# Patient Record
Sex: Female | Born: 1970 | Race: White | Hispanic: No | Marital: Married | Smoking: Never smoker
Health system: Southern US, Community
[De-identification: ages and names within clinical notes are randomized; demographics above are authoritative.]

## PROBLEM LIST (undated history)

## (undated) ENCOUNTER — Emergency Department (HOSPITAL_COMMUNITY)

## (undated) DIAGNOSIS — Z9889 Other specified postprocedural states: Secondary | ICD-10-CM

## (undated) DIAGNOSIS — K219 Gastro-esophageal reflux disease without esophagitis: Secondary | ICD-10-CM

## (undated) DIAGNOSIS — E079 Disorder of thyroid, unspecified: Secondary | ICD-10-CM

## (undated) DIAGNOSIS — T7840XA Allergy, unspecified, initial encounter: Secondary | ICD-10-CM

## (undated) DIAGNOSIS — R112 Nausea with vomiting, unspecified: Secondary | ICD-10-CM

## (undated) DIAGNOSIS — E039 Hypothyroidism, unspecified: Secondary | ICD-10-CM

## (undated) HISTORY — DX: Disorder of thyroid, unspecified: E07.9

## (undated) HISTORY — DX: Allergy, unspecified, initial encounter: T78.40XA

---

## 1999-08-27 ENCOUNTER — Other Ambulatory Visit: Admission: RE | Admit: 1999-08-27 | Discharge: 1999-08-27 | Payer: Self-pay | Admitting: Family Medicine

## 2000-03-26 ENCOUNTER — Encounter: Payer: Self-pay | Admitting: Family Medicine

## 2000-03-26 ENCOUNTER — Ambulatory Visit (HOSPITAL_COMMUNITY): Admission: RE | Admit: 2000-03-26 | Discharge: 2000-03-26 | Payer: Self-pay | Admitting: *Deleted

## 2000-06-20 ENCOUNTER — Ambulatory Visit (HOSPITAL_COMMUNITY): Admission: RE | Admit: 2000-06-20 | Discharge: 2000-06-20 | Payer: Self-pay | Admitting: Family Medicine

## 2000-06-20 ENCOUNTER — Encounter: Payer: Self-pay | Admitting: Family Medicine

## 2000-08-15 ENCOUNTER — Ambulatory Visit (HOSPITAL_COMMUNITY): Admission: RE | Admit: 2000-08-15 | Discharge: 2000-08-15 | Payer: Self-pay | Admitting: Unknown Physician Specialty

## 2000-08-15 ENCOUNTER — Encounter: Payer: Self-pay | Admitting: Unknown Physician Specialty

## 2000-08-29 ENCOUNTER — Encounter (HOSPITAL_COMMUNITY): Admission: AD | Admit: 2000-08-29 | Discharge: 2000-09-05 | Payer: Self-pay | Admitting: Family Medicine

## 2000-09-04 ENCOUNTER — Inpatient Hospital Stay (HOSPITAL_COMMUNITY): Admission: AD | Admit: 2000-09-04 | Discharge: 2000-09-09 | Payer: Self-pay | Admitting: Family Medicine

## 2000-09-10 ENCOUNTER — Encounter: Admission: RE | Admit: 2000-09-10 | Discharge: 2000-10-10 | Payer: Self-pay | Admitting: Family Medicine

## 2000-10-30 ENCOUNTER — Other Ambulatory Visit: Admission: RE | Admit: 2000-10-30 | Discharge: 2000-10-30 | Payer: Self-pay | Admitting: Family Medicine

## 2001-12-11 ENCOUNTER — Other Ambulatory Visit: Admission: RE | Admit: 2001-12-11 | Discharge: 2001-12-11 | Payer: Self-pay | Admitting: Family Medicine

## 2002-12-23 ENCOUNTER — Other Ambulatory Visit: Admission: RE | Admit: 2002-12-23 | Discharge: 2002-12-23 | Payer: Self-pay | Admitting: *Deleted

## 2004-03-16 ENCOUNTER — Other Ambulatory Visit: Admission: RE | Admit: 2004-03-16 | Discharge: 2004-03-16 | Payer: Self-pay | Admitting: Family Medicine

## 2005-05-16 ENCOUNTER — Other Ambulatory Visit: Admission: RE | Admit: 2005-05-16 | Discharge: 2005-05-16 | Payer: Self-pay | Admitting: Family Medicine

## 2012-05-15 ENCOUNTER — Ambulatory Visit (INDEPENDENT_AMBULATORY_CARE_PROVIDER_SITE_OTHER): Payer: BC Managed Care – PPO | Admitting: Nurse Practitioner

## 2012-05-15 ENCOUNTER — Encounter: Payer: Self-pay | Admitting: Nurse Practitioner

## 2012-05-15 VITALS — BP 135/99 | HR 91 | Temp 98.2°F | Ht 65.0 in | Wt 357.0 lb

## 2012-05-15 DIAGNOSIS — R7309 Other abnormal glucose: Secondary | ICD-10-CM

## 2012-05-15 DIAGNOSIS — E039 Hypothyroidism, unspecified: Secondary | ICD-10-CM

## 2012-05-15 DIAGNOSIS — R739 Hyperglycemia, unspecified: Secondary | ICD-10-CM

## 2012-05-15 LAB — GLUCOSE, POCT (MANUAL RESULT ENTRY): POC Glucose: 90 mg/dl (ref 70–99)

## 2012-05-15 LAB — POCT GLYCOSYLATED HEMOGLOBIN (HGB A1C): Hemoglobin A1C: 5.7

## 2012-05-15 MED ORDER — LEVOTHYROXINE SODIUM 50 MCG PO TABS: 50.0000 ug | ORAL_TABLET | Freq: Every day | ORAL | Status: DC

## 2012-05-15 NOTE — Patient Instructions (Addendum)
Health Maintenance, Females A healthy lifestyle and preventative care can promote health and wellness.  Maintain regular health, dental, and eye exams.  Eat a healthy diet. Foods like vegetables, fruits, whole grains, low-fat dairy products, and lean protein foods contain the nutrients you need without too many calories. Decrease your intake of foods high in solid fats, added sugars, and salt. Get information about a proper diet from your caregiver, if necessary.  Regular physical exercise is one of the most important things you can do for your health. Most adults should get at least 150 minutes of moderate-intensity exercise (any activity that increases your heart rate and causes you to sweat) each week. In addition, most adults need muscle-strengthening exercises on 2 or more days a week.   Maintain a healthy weight. The body mass index (BMI) is a screening tool to identify possible weight problems. It provides an estimate of body fat based on height and weight. Your caregiver can help determine your BMI, and can help you achieve or maintain a healthy weight. For adults 20 years and older:  A BMI below 18.5 is considered underweight.  A BMI of 18.5 to 24.9 is normal.  A BMI of 25 to 29.9 is considered overweight.  A BMI of 30 and above is considered obese.  Maintain normal blood lipids and cholesterol by exercising and minimizing your intake of saturated fat. Eat a balanced diet with plenty of fruits and vegetables. Blood tests for lipids and cholesterol should begin at age 20 and be repeated every 5 years. If your lipid or cholesterol levels are high, you are over 50, or you are a high risk for heart disease, you may need your cholesterol levels checked more frequently.Ongoing high lipid and cholesterol levels should be treated with medicines if diet and exercise are not effective.  If you smoke, find out from your caregiver how to quit. If you do not use tobacco, do not start.  If you  are pregnant, do not drink alcohol. If you are breastfeeding, be very cautious about drinking alcohol. If you are not pregnant and choose to drink alcohol, do not exceed 1 drink per day. One drink is considered to be 12 ounces (355 mL) of beer, 5 ounces (148 mL) of wine, or 1.5 ounces (44 mL) of liquor.  Avoid use of street drugs. Do not share needles with anyone. Ask for help if you need support or instructions about stopping the use of drugs.  High blood pressure causes heart disease and increases the risk of stroke. Blood pressure should be checked at least every 1 to 2 years. Ongoing high blood pressure should be treated with medicines, if weight loss and exercise are not effective.  If you are 55 to 40 years old, ask your caregiver if you should take aspirin to prevent strokes.  Diabetes screening involves taking a blood sample to check your fasting blood sugar level. This should be done once every 3 years, after age 45, if you are within normal weight and without risk factors for diabetes. Testing should be considered at a younger age or be carried out more frequently if you are overweight and have at least 1 risk factor for diabetes.  Breast cancer screening is essential preventative care for women. You should practice "breast self-awareness." This means understanding the normal appearance and feel of your breasts and may include breast self-examination. Any changes detected, no matter how small, should be reported to a caregiver. Women in their 20s and 30s should have   a clinical breast exam (CBE) by a caregiver as part of a regular health exam every 1 to 3 years. After age 40, women should have a CBE every year. Starting at age 40, women should consider having a mammogram (breast X-ray) every year. Women who have a family history of breast cancer should talk to their caregiver about genetic screening. Women at a high risk of breast cancer should talk to their caregiver about having an MRI and a  mammogram every year.  The Pap test is a screening test for cervical cancer. Women should have a Pap test starting at age 21. Between ages 21 and 29, Pap tests should be repeated every 2 years. Beginning at age 30, you should have a Pap test every 3 years as long as the past 3 Pap tests have been normal. If you had a hysterectomy for a problem that was not cancer or a condition that could lead to cancer, then you no longer need Pap tests. If you are between ages 65 and 70, and you have had normal Pap tests going back 10 years, you no longer need Pap tests. If you have had past treatment for cervical cancer or a condition that could lead to cancer, you need Pap tests and screening for cancer for at least 20 years after your treatment. If Pap tests have been discontinued, risk factors (such as a new sexual partner) need to be reassessed to determine if screening should be resumed. Some women have medical problems that increase the chance of getting cervical cancer. In these cases, your caregiver may recommend more frequent screening and Pap tests.  The human papillomavirus (HPV) test is an additional test that may be used for cervical cancer screening. The HPV test looks for the virus that can cause the cell changes on the cervix. The cells collected during the Pap test can be tested for HPV. The HPV test could be used to screen women aged 30 years and older, and should be used in women of any age who have unclear Pap test results. After the age of 30, women should have HPV testing at the same frequency as a Pap test.  Colorectal cancer can be detected and often prevented. Most routine colorectal cancer screening begins at the age of 50 and continues through age 75. However, your caregiver may recommend screening at an earlier age if you have risk factors for colon cancer. On a yearly basis, your caregiver may provide home test kits to check for hidden blood in the stool. Use of a small camera at the end of a  tube, to directly examine the colon (sigmoidoscopy or colonoscopy), can detect the earliest forms of colorectal cancer. Talk to your caregiver about this at age 50, when routine screening begins. Direct examination of the colon should be repeated every 5 to 10 years through age 75, unless early forms of pre-cancerous polyps or small growths are found.  Hepatitis C blood testing is recommended for all people born from 1945 through 1965 and any individual with known risks for hepatitis C.  Practice safe sex. Use condoms and avoid high-risk sexual practices to reduce the spread of sexually transmitted infections (STIs). Sexually active women aged 25 and younger should be checked for Chlamydia, which is a common sexually transmitted infection. Older women with new or multiple partners should also be tested for Chlamydia. Testing for other STIs is recommended if you are sexually active and at increased risk.  Osteoporosis is a disease in which the   bones lose minerals and strength with aging. This can result in serious bone fractures. The risk of osteoporosis can be identified using a bone density scan. Women ages 15 and over and women at risk for fractures or osteoporosis should discuss screening with their caregivers. Ask your caregiver whether you should be taking a calcium supplement or vitamin D to reduce the rate of osteoporosis.  Menopause can be associated with physical symptoms and risks. Hormone replacement therapy is available to decrease symptoms and risks. You should talk to your caregiver about whether hormone replacement therapy is right for you.  Use sunscreen with a sun protection factor (SPF) of 30 or greater. Apply sunscreen liberally and repeatedly throughout the day. You should seek shade when your shadow is shorter than you. Protect yourself by wearing long sleeves, pants, a wide-brimmed hat, and sunglasses year round, whenever you are outdoors.  Notify your caregiver of new moles or  changes in moles, especially if there is a change in shape or color. Also notify your caregiver if a mole is larger than the size of a pencil eraser.  Stay current with your immunizations. Document Released: 08/13/2010 Document Revised: 04/22/2011 Document Reviewed: 08/13/2010 [facility] Patient Information 2013 [place], [place]. Hypothyroidism The thyroid is a large gland located in the lower front of your neck. The thyroid gland helps control metabolism. Metabolism is how your body handles food. It controls metabolism with the hormone thyroxine. When this gland is underactive (hypothyroid), it produces too little hormone.  CAUSES These include:  Absence or destruction of thyroid tissue. Goiter due to iodine deficiency. Goiter due to medications. Congenital defects (since birth). Problems with the pituitary. This causes a lack of TSH (thyroid stimulating hormone). This hormone tells the thyroid to turn out more hormone. SYMPTOMS Lethargy (feeling as though you have no energy) Cold intolerance Weight gain (in spite of normal food intake) Dry skin Coarse hair Menstrual irregularity (if severe, may lead to infertility) Slowing of thought processes Cardiac problems are also caused by insufficient amounts of thyroid hormone. Hypothyroidism in the newborn is cretinism, and is an extreme form. It is important that this form be treated adequately and immediately or it will lead rapidly to retarded physical and mental development. DIAGNOSIS  To prove hypothyroidism, your caregiver may do blood tests and ultrasound tests. Sometimes the signs are hidden. It may be necessary for your caregiver to watch this illness with blood tests either before or after diagnosis and treatment. TREATMENT  Low levels of thyroid hormone are increased by using synthetic thyroid hormone. This is a safe, effective treatment. It usually takes about four weeks to gain the full effects of the medication. After you have the full  effect of the medication, it will generally take another four weeks for problems to leave. Your caregiver may start you on low doses. If you have had heart problems the dose may be gradually increased. It is generally not an emergency to get rapidly to normal. HOME CARE INSTRUCTIONS  Take your medications as your caregiver suggests. Let your caregiver know of any medications you are taking or start taking. Your caregiver will help you with dosage schedules. As your condition improves, your dosage needs may increase. It will be necessary to have continuing blood tests as suggested by your caregiver. Report all suspected medication side effects to your caregiver. SEEK MEDICAL CARE IF: Seek medical care if you develop: Sweating. Tremulousness (tremors). Anxiety. Rapid weight loss. Heat intolerance. Emotional swings. Diarrhea. Weakness. SEEK IMMEDIATE MEDICAL CARE IF:  You develop chest pain, an irregular heart beat (palpitations), or a rapid heart beat. MAKE SURE YOU:  Understand these instructions. Will watch your condition. Will get help right away if you are not doing well or get worse. Document Released: 01/28/2005 Document Revised: 04/22/2011 Document Reviewed: 09/18/2007 [facility] Patient Information 2013 [place], [place].

## 2012-05-15 NOTE — Progress Notes (Signed)
  Subjective:    Patient ID: Sylvia Taylor, female    DOB: 02-21-1970, 42 y.o.   MRN: [id number]  HPI Pt had labs drawn at work with an abnormal glucose and TSH level. Pt here for follow up for those labs. She has no complaints at this time.    Review of Systems  Constitutional: Negative.   HENT: Negative.   Eyes: Negative.   Respiratory: Negative.  Negative for cough, chest tightness and shortness of breath.   Cardiovascular: Negative.  Negative for chest pain.  Gastrointestinal: Negative.   Endocrine: Positive for heat intolerance and polydipsia. Negative for cold intolerance, polyphagia and polyuria.  Genitourinary: Negative.   Musculoskeletal: Positive for myalgias.  Skin: Negative.   Allergic/Immunologic: Positive for environmental allergies.  Neurological: Negative.   Psychiatric/Behavioral: Negative.        Objective:   Physical Exam  Constitutional: She is oriented to person, place, and time. She appears well-developed and well-nourished.  HENT:  Nose: Nose normal.  Mouth/Throat: Oropharynx is clear and moist.  Eyes: EOM are normal.  Neck: Trachea normal, normal range of motion and full passive range of motion without pain. Neck supple. No JVD present. Carotid bruit is not present. No thyromegaly present.  Cardiovascular: Normal rate, regular rhythm, normal heart sounds and intact distal pulses.  Exam reveals no gallop and no friction rub.   No murmur heard. Pulmonary/Chest: Effort normal and breath sounds normal.  Abdominal: Soft. Bowel sounds are normal. She exhibits no distension and no mass. There is no tenderness.  Musculoskeletal: Normal range of motion.  Lymphadenopathy:    She has no cervical adenopathy.  Neurological: She is alert and oriented to person, place, and time. She has normal reflexes.  Skin: Skin is warm and dry.  Psychiatric: She has a normal mood and affect. Her behavior is normal. Judgment and thought content normal.    BP 135/99  Pulse 91   Temp(Src) 98.2 F (36.8 C) (Oral)  Ht 5\' 5"  (1.651 m)  Wt 357 lb (161.934 kg)  BMI 59.41 kg/m2  LMP 04/20/2012  Results for orders placed in visit on 05/15/12  GLUCOSE, POCT (MANUAL RESULT ENTRY)      Result Value Range   POC Glucose 90  70 - 99 mg/dl   Results for orders placed in visit on 05/15/12  POCT GLYCOSYLATED HEMOGLOBIN (HGB A1C)      Result Value Range   Hemoglobin A1C 5.7    GLUCOSE, POCT (MANUAL RESULT ENTRY)      Result Value Range   POC Glucose 90  70 - 99 mg/dl          Assessment & Plan:  Hyperglycemia  Encourage low Carb diet  Encourage exercise  A1C ordered Hypothyroidism  Levothyroxine as RX  Repeat labs in 6 weeks   [name], FNP

## 2012-06-26 ENCOUNTER — Other Ambulatory Visit (INDEPENDENT_AMBULATORY_CARE_PROVIDER_SITE_OTHER): Payer: BC Managed Care – PPO

## 2012-06-26 DIAGNOSIS — E039 Hypothyroidism, unspecified: Secondary | ICD-10-CM

## 2012-06-26 LAB — THYROID PANEL WITH TSH
Free Thyroxine Index: 3.2 (ref 1.0–3.9)
T3 Uptake: 29 % (ref 22.5–37.0)
T4, Total: 11 ug/dL (ref 5.0–12.5)
TSH: 3.511 u[IU]/mL (ref 0.350–4.500)

## 2012-10-27 ENCOUNTER — Other Ambulatory Visit: Payer: Self-pay | Admitting: Nurse Practitioner

## 2012-11-03 ENCOUNTER — Encounter: Payer: Self-pay | Admitting: Nurse Practitioner

## 2012-11-03 ENCOUNTER — Ambulatory Visit (INDEPENDENT_AMBULATORY_CARE_PROVIDER_SITE_OTHER): Payer: BC Managed Care – PPO | Admitting: Nurse Practitioner

## 2012-11-03 VITALS — BP 112/75 | HR 88 | Temp 98.7°F | Ht 65.0 in | Wt 363.0 lb

## 2012-11-03 DIAGNOSIS — R600 Localized edema: Secondary | ICD-10-CM

## 2012-11-03 DIAGNOSIS — J309 Allergic rhinitis, unspecified: Secondary | ICD-10-CM | POA: Insufficient documentation

## 2012-11-03 DIAGNOSIS — E039 Hypothyroidism, unspecified: Secondary | ICD-10-CM

## 2012-11-03 DIAGNOSIS — R609 Edema, unspecified: Secondary | ICD-10-CM

## 2012-11-03 MED ORDER — FUROSEMIDE 20 MG PO TABS: 20.0000 mg | ORAL_TABLET | Freq: Every day | ORAL | Status: DC

## 2012-11-03 NOTE — Patient Instructions (Signed)

## 2012-11-03 NOTE — Progress Notes (Signed)
  Subjective:    Patient ID: Sylvia Taylor, female    DOB: March 31, 1970, 42 y.o.   MRN: [id number]  Thyroid Problem Presents for follow-up (hypothyroidism) visit. Patient reports no anxiety, cold intolerance, depressed mood, diaphoresis, diarrhea, heat intolerance, leg swelling, menstrual problem, palpitations, tremors, visual change, weight gain or weight loss. The symptoms have been stable.  allergic rhinitis Flonase, zytrec and patanase daily- combination is working well.   Review of Systems  Constitutional: Negative for weight loss, weight gain and diaphoresis.  Cardiovascular: Negative for palpitations.  Gastrointestinal: Negative for diarrhea.  Endocrine: Negative for cold intolerance and heat intolerance.  Genitourinary: Negative for menstrual problem.  Neurological: Negative for tremors.  All other systems reviewed and are negative.       Objective:   Physical Exam  Constitutional: She is oriented to person, place, and time. She appears well-developed and well-nourished.  HENT:  Nose: Nose normal.  Mouth/Throat: Oropharynx is clear and moist.  Eyes: EOM are normal.  Neck: Trachea normal, normal range of motion and full passive range of motion without pain. Neck supple. No JVD present. Carotid bruit is not present. No thyromegaly present.  Cardiovascular: Normal rate, regular rhythm, normal heart sounds and intact distal pulses.  Exam reveals no gallop and no friction rub.   No murmur heard. Pulmonary/Chest: Effort normal and breath sounds normal.  Abdominal: Soft. Bowel sounds are normal. She exhibits no distension and no mass. There is no tenderness.  Musculoskeletal: Normal range of motion.  Lymphadenopathy:    She has no cervical adenopathy.  Neurological: She is alert and oriented to person, place, and time. She has normal reflexes.  Skin: Skin is warm and dry.  Psychiatric: She has a normal mood and affect. Her behavior is normal. Judgment and thought content normal.    BP 112/75  Pulse 88  Temp(Src) 98.7 F (37.1 C) (Oral)  Ht 5\' 5"  (1.651 m)  Wt 363 lb (164.656 kg)  BMI 60.41 kg/m2        Assessment & Plan:  1. Hypothyroidism - CMP14+EGFR - NMR, lipoprofile - Thyroid Panel With TSH  2. Allergic rhinitis Continue all medsd  3. Peripheral edema Elevate legs when sitting - furosemide (LASIX) 20 MG tablet; Take 1 tablet (20 mg total) by mouth daily.  Dispense: 30 tablet; Refill: 3  [name], FNP

## 2012-11-04 ENCOUNTER — Encounter: Payer: Self-pay | Admitting: Nurse Practitioner

## 2012-11-05 LAB — CMP14+EGFR
ALT: 20 IU/L (ref 0–32)
AST: 16 IU/L (ref 0–40)
Albumin/Globulin Ratio: 1.6 (ref 1.1–2.5)
Albumin: 4.4 g/dL (ref 3.5–5.5)
Alkaline Phosphatase: 96 IU/L (ref 39–117)
BUN/Creatinine Ratio: 19 (ref 9–23)
BUN: 13 mg/dL (ref 6–24)
CO2: 24 mmol/L (ref 18–29)
Calcium: 9.2 mg/dL (ref 8.7–10.2)
Chloride: 101 mmol/L (ref 97–108)
Creatinine, Ser: 0.68 mg/dL (ref 0.57–1.00)
GFR calc Af Amer: 127 mL/min/{1.73_m2} (ref >59)
GFR calc non Af Amer: 110 mL/min/{1.73_m2} (ref >59)
Globulin, Total: 2.7 g/dL (ref 1.5–4.5)
Glucose: 83 mg/dL (ref 65–99)
Potassium: 5.1 mmol/L (ref 3.5–5.2)
Sodium: 142 mmol/L (ref 134–144)
Total Bilirubin: 0.3 mg/dL (ref 0.0–1.2)
Total Protein: 7.1 g/dL (ref 6.0–8.5)

## 2012-11-05 LAB — NMR, LIPOPROFILE
Cholesterol: 158 mg/dL (ref <200)
HDL Cholesterol by NMR: 41 mg/dL (ref >=40)
HDL Particle Number: 32.4 umol/L (ref >=30.5)
LDL Particle Number: 1467 nmol/L — ABNORMAL HIGH (ref <1000)
LDL Size: 20.8 nm (ref >20.5)
LDLC SERPL CALC-MCNC: 97 mg/dL (ref <100)
LP-IR Score: 74 — ABNORMAL HIGH (ref <=45)
Small LDL Particle Number: 407 nmol/L (ref <=527)
Triglycerides by NMR: 100 mg/dL (ref <150)

## 2012-11-05 LAB — THYROID PANEL WITH TSH
Free Thyroxine Index: 2.4 (ref 1.2–4.9)
T3 Uptake Ratio: 24 % (ref 24–39)
T4, Total: 9.9 ug/dL (ref 4.5–12.0)
TSH: 3.36 u[IU]/mL (ref 0.450–4.500)

## 2013-02-15 ENCOUNTER — Ambulatory Visit: Payer: BC Managed Care – PPO | Admitting: Nurse Practitioner

## 2013-03-03 ENCOUNTER — Encounter: Payer: Self-pay | Admitting: Nurse Practitioner

## 2013-03-03 ENCOUNTER — Ambulatory Visit (INDEPENDENT_AMBULATORY_CARE_PROVIDER_SITE_OTHER): Payer: BC Managed Care – PPO | Admitting: Nurse Practitioner

## 2013-03-03 VITALS — BP 126/81 | HR 90 | Temp 98.5°F | Ht 65.0 in | Wt 363.0 lb

## 2013-03-03 DIAGNOSIS — E785 Hyperlipidemia, unspecified: Secondary | ICD-10-CM

## 2013-03-03 DIAGNOSIS — E039 Hypothyroidism, unspecified: Secondary | ICD-10-CM

## 2013-03-03 NOTE — Patient Instructions (Signed)
Fat and Cholesterol Control Diet  Fat and cholesterol levels in your blood and organs are influenced by your diet. High levels of fat and cholesterol may lead to diseases of the heart, small and large blood vessels, gallbladder, liver, and pancreas.  CONTROLLING FAT AND CHOLESTEROL WITH DIET  Although exercise and lifestyle factors are important, your diet is key. That is because certain foods are known to raise cholesterol and others to lower it. The goal is to balance foods for their effect on cholesterol and more importantly, to replace saturated and trans fat with other types of fat, such as monounsaturated fat, polyunsaturated fat, and omega-3 fatty acids.  On average, a person should consume no more than 15 to 17 g of saturated fat daily. Saturated and trans fats are considered "bad" fats, and they will raise LDL cholesterol. Saturated fats are primarily found in animal products such as meats, butter, and cream. However, that does not mean you need to give up all your favorite foods. Today, there are good tasting, low-fat, low-cholesterol substitutes for most of the things you like to eat. Choose low-fat or nonfat alternatives. Choose round or loin cuts of red meat. These types of cuts are lowest in fat and cholesterol. Chicken (without the skin), fish, veal, and ground turkey breast are great choices. Eliminate fatty meats, such as hot dogs and salami. Even shellfish have little or no saturated fat. Have a 3 oz (85 g) portion when you eat lean meat, poultry, or fish.  Trans fats are also called "partially hydrogenated oils." They are oils that have been scientifically manipulated so that they are solid at room temperature resulting in a longer shelf life and improved taste and texture of foods in which they are added. Trans fats are found in stick margarine, some tub margarines, cookies, crackers, and baked goods.   When baking and cooking, oils are a great substitute for butter. The monounsaturated oils are  especially beneficial since it is believed they lower LDL and raise HDL. The oils you should avoid entirely are saturated tropical oils, such as coconut and palm.   Remember to eat a lot from food groups that are naturally free of saturated and trans fat, including fish, fruit, vegetables, beans, grains (barley, rice, couscous, bulgur wheat), and pasta (without cream sauces).   IDENTIFYING FOODS THAT LOWER FAT AND CHOLESTEROL   Soluble fiber may lower your cholesterol. This type of fiber is found in fruits such as apples, vegetables such as broccoli, potatoes, and carrots, legumes such as beans, peas, and lentils, and grains such as barley. Foods fortified with plant sterols (phytosterol) may also lower cholesterol. You should eat at least 2 g per day of these foods for a cholesterol lowering effect.   Read package labels to identify low-saturated fats, trans fat free, and low-fat foods at the supermarket. Select cheeses that have only 2 to 3 g saturated fat per ounce. Use a heart-healthy tub margarine that is free of trans fats or partially hydrogenated oil. When buying baked goods (cookies, crackers), avoid partially hydrogenated oils. Breads and muffins should be made from whole grains (whole-wheat or whole oat flour, instead of "flour" or "enriched flour"). Buy non-creamy canned soups with reduced salt and no added fats.   FOOD PREPARATION TECHNIQUES   Never deep-fry. If you must fry, either stir-fry, which uses very little fat, or use non-stick cooking sprays. When possible, broil, bake, or roast meats, and steam vegetables. Instead of putting butter or margarine on vegetables, use lemon   and herbs, applesauce, and cinnamon (for squash and sweet potatoes). Use nonfat yogurt, salsa, and low-fat dressings for salads.   LOW-SATURATED FAT / LOW-FAT FOOD SUBSTITUTES  Meats / Saturated Fat (g)  · Avoid: Steak, marbled (3 oz/85 g) / 11 g  · Choose: Steak, lean (3 oz/85 g) / 4 g  · Avoid: Hamburger (3 oz/85 g) / 7  g  · Choose: Hamburger, lean (3 oz/85 g) / 5 g  · Avoid: Ham (3 oz/85 g) / 6 g  · Choose: Ham, lean cut (3 oz/85 g) / 2.4 g  · Avoid: Chicken, with skin, dark meat (3 oz/85 g) / 4 g  · Choose: Chicken, skin removed, dark meat (3 oz/85 g) / 2 g  · Avoid: Chicken, with skin, light meat (3 oz/85 g) / 2.5 g  · Choose: Chicken, skin removed, light meat (3 oz/85 g) / 1 g  Dairy / Saturated Fat (g)  · Avoid: Whole milk (1 cup) / 5 g  · Choose: Low-fat milk, 2% (1 cup) / 3 g  · Choose: Low-fat milk, 1% (1 cup) / 1.5 g  · Choose: Skim milk (1 cup) / 0.3 g  · Avoid: Hard cheese (1 oz/28 g) / 6 g  · Choose: Skim milk cheese (1 oz/28 g) / 2 to 3 g  · Avoid: Cottage cheese, 4% fat (1 cup) / 6.5 g  · Choose: Low-fat cottage cheese, 1% fat (1 cup) / 1.5 g  · Avoid: Ice cream (1 cup) / 9 g  · Choose: Sherbet (1 cup) / 2.5 g  · Choose: Nonfat frozen yogurt (1 cup) / 0.3 g  · Choose: Frozen fruit bar / trace  · Avoid: Whipped cream (1 tbs) / 3.5 g  · Choose: Nondairy whipped topping (1 tbs) / 1 g  Condiments / Saturated Fat (g)  · Avoid: Mayonnaise (1 tbs) / 2 g  · Choose: Low-fat mayonnaise (1 tbs) / 1 g  · Avoid: Butter (1 tbs) / 7 g  · Choose: Extra light margarine (1 tbs) / 1 g  · Avoid: Coconut oil (1 tbs) / 11.8 g  · Choose: Olive oil (1 tbs) / 1.8 g  · Choose: Corn oil (1 tbs) / 1.7 g  · Choose: Safflower oil (1 tbs) / 1.2 g  · Choose: Sunflower oil (1 tbs) / 1.4 g  · Choose: Soybean oil (1 tbs) / 2.4 g  · Choose: Canola oil (1 tbs) / 1 g  Document Released: 01/28/2005 Document Revised: 05/25/2012 Document Reviewed: 07/19/2010  [employer]® Patient Information ©2014 [employer].

## 2013-03-03 NOTE — Progress Notes (Signed)
   Subjective:    Patient ID: Sylvia Taylor, female    DOB: Nov 26, 1970, 43 y.o.   MRN: [id number]  Thyroid Problem Presents for follow-up (hypothyroidism) visit. Symptoms include hair loss. Patient reports no anxiety, cold intolerance, depressed mood, diaphoresis, diarrhea, heat intolerance, leg swelling, menstrual problem, palpitations, tremors, visual change, weight gain or weight loss. The symptoms have been stable.  Continues to have low energy, increased fatigue, and is having more hair loss since last visit.  No change in appetite. * cholesterol level LDL particle Numbers were elevated at last visit- encouraged diet and exercse- we will recheck levels today.  Review of Systems  Constitutional: Negative for weight loss, weight gain, diaphoresis, activity change and appetite change.  HENT: Negative.   Eyes: Negative.   Respiratory: Negative.  Negative for shortness of breath.   Cardiovascular: Negative for chest pain and palpitations.  Gastrointestinal: Negative.  Negative for diarrhea.  Endocrine: Negative for cold intolerance and heat intolerance.  Genitourinary: Negative for menstrual problem.  Musculoskeletal: Negative.   Neurological: Negative for tremors.  All other systems reviewed and are negative.       Objective:   Physical Exam  Constitutional: She is oriented to person, place, and time. She appears well-developed and well-nourished.  HENT:  Head: Normocephalic and atraumatic.  Neck: Normal range of motion. Neck supple.  Cardiovascular: Normal rate, regular rhythm and normal heart sounds.  Exam reveals no gallop and no friction rub.   No murmur heard. Pulmonary/Chest: Effort normal and breath sounds normal. She has no wheezes. She exhibits no tenderness.  Abdominal: Soft. Bowel sounds are normal.  Neurological: She is alert and oriented to person, place, and time.  Skin: Skin is warm and dry.  Psychiatric: She has a normal mood and affect. Her behavior is normal.     BP 126/81  Pulse 90  Temp(Src) 98.5 F (36.9 C) (Oral)  Ht _0  (1.651 m)  Wt 363 lb (164.656 kg)  BMI 60.41 kg/m2        Assessment & Plan:   1. Hypothyroidism   2. Hyperlipidemia LDL goal < 100    Orders Placed This Encounter  Procedures  . Thyroid Panel With TSH  . CMP14+EGFR  . NMR, lipoprofile    Labs pending  Health maintenance reviewed Diet and exercise encouraged Continue all meds Follow up  In 3 months  [place], [place] Hassell Done, FNP

## 2013-03-05 LAB — CMP14+EGFR
ALT: 22 IU/L (ref 0–32)
AST: 12 IU/L (ref 0–40)
Albumin/Globulin Ratio: 1.3 (ref 1.1–2.5)
Albumin: 4.1 g/dL (ref 3.5–5.5)
Alkaline Phosphatase: 88 IU/L (ref 39–117)
BUN/Creatinine Ratio: 19 (ref 9–23)
BUN: 13 mg/dL (ref 6–24)
CO2: 23 mmol/L (ref 18–29)
Calcium: 9.1 mg/dL (ref 8.7–10.2)
Chloride: 100 mmol/L (ref 97–108)
Creatinine, Ser: 0.7 mg/dL (ref 0.57–1.00)
GFR calc Af Amer: 125 mL/min/{1.73_m2} (ref >59)
GFR calc non Af Amer: 109 mL/min/{1.73_m2} (ref >59)
Globulin, Total: 3.1 g/dL (ref 1.5–4.5)
Glucose: 83 mg/dL (ref 65–99)
Potassium: 4.7 mmol/L (ref 3.5–5.2)
Sodium: 139 mmol/L (ref 134–144)
Total Bilirubin: <0.2 mg/dL (ref 0.0–1.2)
Total Protein: 7.2 g/dL (ref 6.0–8.5)

## 2013-03-05 LAB — NMR, LIPOPROFILE
Cholesterol: 158 mg/dL (ref <200)
HDL Cholesterol by NMR: 42 mg/dL (ref >=40)
HDL Particle Number: 30.2 umol/L — ABNORMAL LOW (ref >=30.5)
LDL Particle Number: 1212 nmol/L — ABNORMAL HIGH (ref <1000)
LDL Size: 21.1 nm (ref >20.5)
LDLC SERPL CALC-MCNC: 95 mg/dL (ref <100)
LP-IR Score: 51 — ABNORMAL HIGH (ref <=45)
Small LDL Particle Number: 523 nmol/L (ref <=527)
Triglycerides by NMR: 103 mg/dL (ref <150)

## 2013-03-05 LAB — THYROID PANEL WITH TSH
Free Thyroxine Index: 2.4 (ref 1.2–4.9)
T3 Uptake Ratio: 26 % (ref 24–39)
T4, Total: 9.2 ug/dL (ref 4.5–12.0)
TSH: 2.72 u[IU]/mL (ref 0.450–4.500)

## 2013-04-30 ENCOUNTER — Encounter: Payer: Self-pay | Admitting: Nurse Practitioner

## 2013-05-13 ENCOUNTER — Other Ambulatory Visit: Payer: Self-pay | Admitting: Obstetrics and Gynecology

## 2013-05-13 DIAGNOSIS — R928 Other abnormal and inconclusive findings on diagnostic imaging of breast: Secondary | ICD-10-CM

## 2013-05-25 ENCOUNTER — Ambulatory Visit
Admission: RE | Admit: 2013-05-25 | Discharge: 2013-05-25 | Disposition: A | Discharge status: Home / Self Care | Payer: BC Managed Care – PPO | Source: Ambulatory Visit | Attending: Obstetrics and Gynecology | Admitting: Obstetrics and Gynecology

## 2013-05-25 ENCOUNTER — Other Ambulatory Visit: Payer: Self-pay | Admitting: Obstetrics and Gynecology

## 2013-05-25 DIAGNOSIS — R928 Other abnormal and inconclusive findings on diagnostic imaging of breast: Secondary | ICD-10-CM

## 2013-05-27 ENCOUNTER — Ambulatory Visit
Admission: RE | Admit: 2013-05-27 | Discharge: 2013-05-27 | Disposition: A | Discharge status: Home / Self Care | Payer: BC Managed Care – PPO | Source: Ambulatory Visit | Attending: Obstetrics and Gynecology | Admitting: Obstetrics and Gynecology

## 2013-05-27 ENCOUNTER — Other Ambulatory Visit: Payer: Self-pay | Admitting: Obstetrics and Gynecology

## 2013-05-27 DIAGNOSIS — R928 Other abnormal and inconclusive findings on diagnostic imaging of breast: Secondary | ICD-10-CM

## 2013-05-27 HISTORY — PX: BREAST BIOPSY: SHX20

## 2013-06-02 ENCOUNTER — Encounter: Payer: Self-pay | Admitting: Nurse Practitioner

## 2013-06-02 ENCOUNTER — Ambulatory Visit (INDEPENDENT_AMBULATORY_CARE_PROVIDER_SITE_OTHER): Payer: BC Managed Care – PPO | Admitting: Nurse Practitioner

## 2013-06-02 VITALS — BP 139/85 | HR 90 | Temp 97.4°F | Ht 65.0 in | Wt 361.0 lb

## 2013-06-02 DIAGNOSIS — Z713 Dietary counseling and surveillance: Secondary | ICD-10-CM

## 2013-06-02 DIAGNOSIS — R600 Localized edema: Secondary | ICD-10-CM

## 2013-06-02 DIAGNOSIS — E039 Hypothyroidism, unspecified: Secondary | ICD-10-CM

## 2013-06-02 DIAGNOSIS — J309 Allergic rhinitis, unspecified: Secondary | ICD-10-CM

## 2013-06-02 DIAGNOSIS — R609 Edema, unspecified: Secondary | ICD-10-CM

## 2013-06-02 NOTE — Patient Instructions (Signed)

## 2013-06-02 NOTE — Progress Notes (Signed)
  Subjective:    Patient ID: Sylvia Taylor, female    DOB: [birth year], 43 y.o.   MRN: [id number]  Patient here today for follow up of chronic medical problems. NO complaints today.  Thyroid Problem Presents for follow-up (hypothyroidism) visit. Patient reports no anxiety, cold intolerance, depressed mood, diaphoresis, diarrhea, heat intolerance, leg swelling, menstrual problem, palpitations, tremors, visual change, weight gain or weight loss. The symptoms have been stable.  allergic rhinitis Flonase, zytrec and patanase daily- combination is working well. Fluid retention Mainly when she is on her period takes lasix which resolves it.   Review of Systems  Constitutional: Negative for weight loss, weight gain and diaphoresis.  Cardiovascular: Negative for palpitations.  Gastrointestinal: Negative for diarrhea.  Endocrine: Negative for cold intolerance and heat intolerance.  Genitourinary: Negative for menstrual problem.  Neurological: Negative for tremors.  All other systems reviewed and are negative.      Objective:   Physical Exam  Constitutional: She is oriented to person, place, and time. She appears well-developed and well-nourished.  HENT:  Nose: Nose normal.  Mouth/Throat: Oropharynx is clear and moist.  Eyes: EOM are normal.  Neck: Trachea normal, normal range of motion and full passive range of motion without pain. Neck supple. No JVD present. Carotid bruit is not present. No thyromegaly present.  Cardiovascular: Normal rate, regular rhythm, normal heart sounds and intact distal pulses.  Exam reveals no gallop and no friction rub.   No murmur heard. Pulmonary/Chest: Effort normal and breath sounds normal.  Abdominal: Soft. Bowel sounds are normal. She exhibits no distension and no mass. There is no tenderness.  Musculoskeletal: Normal range of motion.  Lymphadenopathy:    She has no cervical adenopathy.  Neurological: She is alert and oriented to person, place, and time.  She has normal reflexes.  Skin: Skin is warm and dry.  Psychiatric: She has a normal mood and affect. Her behavior is normal. Judgment and thought content normal.   BP 139/85  Pulse 90  Temp(Src) 97.4 F (36.3 C) (Oral)  Ht _0  (1.651 m)  Wt 361 lb (163.749 kg)  BMI 60.07 kg/m2        Assessment & Plan:   1. Hypothyroidism   2. Allergic rhinitis   3. Fluid retention in legs   4. Severe obesity (BMI >= 40)   5. Weight loss counseling, encounter for    Orders Placed This Encounter  Procedures  . CMP14+EGFR  . NMR, lipoprofile  . Thyroid Panel With TSH   Meds ordered this encounter  Medications  . norethindrone (MICRONOR,CAMILA,ERRIN) 0.35 MG tablet    Sig:     Labs pending Health maintenance reviewed Diet and exercise encouraged Continue all meds Follow up  In 6 months    [place], FNP

## 2013-06-03 LAB — CMP14+EGFR
ALT: 25 IU/L (ref 0–32)
AST: 19 IU/L (ref 0–40)
Albumin/Globulin Ratio: 1.6 (ref 1.1–2.5)
Albumin: 4.4 g/dL (ref 3.5–5.5)
Alkaline Phosphatase: 90 IU/L (ref 39–117)
BUN/Creatinine Ratio: 22 (ref 9–23)
BUN: 14 mg/dL (ref 6–24)
CO2: 26 mmol/L (ref 18–29)
Calcium: 9.6 mg/dL (ref 8.7–10.2)
Chloride: 97 mmol/L (ref 97–108)
Creatinine, Ser: 0.64 mg/dL (ref 0.57–1.00)
GFR calc Af Amer: 129 mL/min/{1.73_m2} (ref >59)
GFR calc non Af Amer: 112 mL/min/{1.73_m2} (ref >59)
Globulin, Total: 2.8 g/dL (ref 1.5–4.5)
Glucose: 78 mg/dL (ref 65–99)
Potassium: 5 mmol/L (ref 3.5–5.2)
Sodium: 138 mmol/L (ref 134–144)
Total Bilirubin: 0.4 mg/dL (ref 0.0–1.2)
Total Protein: 7.2 g/dL (ref 6.0–8.5)

## 2013-06-03 LAB — THYROID PANEL WITH TSH
Free Thyroxine Index: 2.3 (ref 1.2–4.9)
T3 Uptake Ratio: 25 % (ref 24–39)
T4, Total: 9.1 ug/dL (ref 4.5–12.0)
TSH: 3.79 u[IU]/mL (ref 0.450–4.500)

## 2013-06-03 LAB — NMR, LIPOPROFILE
Cholesterol: 163 mg/dL (ref <200)
HDL Cholesterol by NMR: 38 mg/dL — ABNORMAL LOW (ref >=40)
HDL Particle Number: 28.2 umol/L — ABNORMAL LOW (ref >=30.5)
LDL Particle Number: 1354 nmol/L — ABNORMAL HIGH (ref <1000)
LDL Size: 20.8 nm (ref >20.5)
LDLC SERPL CALC-MCNC: 104 mg/dL — ABNORMAL HIGH (ref <100)
LP-IR Score: 64 — ABNORMAL HIGH (ref <=45)
Small LDL Particle Number: 638 nmol/L — ABNORMAL HIGH (ref <=527)
Triglycerides by NMR: 106 mg/dL (ref <150)

## 2013-10-04 ENCOUNTER — Other Ambulatory Visit: Payer: Self-pay | Admitting: Nurse Practitioner

## 2013-10-20 ENCOUNTER — Other Ambulatory Visit: Payer: Self-pay | Admitting: Obstetrics and Gynecology

## 2013-10-20 DIAGNOSIS — N6012 Diffuse cystic mastopathy of left breast: Secondary | ICD-10-CM

## 2013-11-11 ENCOUNTER — Ambulatory Visit (INDEPENDENT_AMBULATORY_CARE_PROVIDER_SITE_OTHER): Payer: BC Managed Care – PPO

## 2013-11-11 ENCOUNTER — Ambulatory Visit (INDEPENDENT_AMBULATORY_CARE_PROVIDER_SITE_OTHER): Payer: BC Managed Care – PPO | Admitting: Family Medicine

## 2013-11-11 ENCOUNTER — Encounter: Payer: Self-pay | Admitting: Family Medicine

## 2013-11-11 VITALS — BP 120/80 | HR 82 | Temp 97.8°F | Ht 65.0 in | Wt 360.0 lb

## 2013-11-11 DIAGNOSIS — M79642 Pain in left hand: Secondary | ICD-10-CM

## 2013-11-11 MED ORDER — NAPROXEN 500 MG PO TABS: 500.0000 mg | ORAL_TABLET | Freq: Two times a day (BID) | ORAL | Status: DC

## 2013-11-12 NOTE — Progress Notes (Signed)
   Subjective:    Patient ID: Sylvia Taylor, female    DOB: February 05, 1971, 43 y.o.   MRN: [id number]  HPI Patient c/o left hand pain for 2 weeks.  She was moving boxes and she had a box flex her hand back and she has been having persistent pain since.   Review of Systems    No chest pain, SOB, HA, dizziness, vision change, N/V, diarrhea, constipation, dysuria, urinary urgency or frequency, myalgias, arthralgias or rash.  Objective:   Physical Exam   TTP left 4th metacarpal and left distal ulna.  Decreased ROM with flexion and extension of left hand    Xray left hand and wrist - No fracture Assessment & Plan:  Left hand pain - Plan: DG Hand Complete Left, naproxen (NAPROSYN) 500 MG tablet Po bid x 10 days Cock up spint left wrist  Lysbeth Penner FNP

## 2013-11-30 ENCOUNTER — Ambulatory Visit
Admission: RE | Admit: 2013-11-30 | Discharge: 2013-11-30 | Disposition: A | Discharge status: Home / Self Care | Payer: BC Managed Care – PPO | Source: Ambulatory Visit | Attending: Obstetrics and Gynecology | Admitting: Obstetrics and Gynecology

## 2013-11-30 DIAGNOSIS — N6012 Diffuse cystic mastopathy of left breast: Secondary | ICD-10-CM

## 2013-12-03 ENCOUNTER — Encounter: Payer: Self-pay | Admitting: Nurse Practitioner

## 2013-12-03 ENCOUNTER — Ambulatory Visit (INDEPENDENT_AMBULATORY_CARE_PROVIDER_SITE_OTHER): Payer: BC Managed Care – PPO | Admitting: Nurse Practitioner

## 2013-12-03 VITALS — BP 117/87 | HR 74 | Temp 97.4°F | Ht 64.0 in | Wt 240.6 lb

## 2013-12-03 DIAGNOSIS — E034 Atrophy of thyroid (acquired): Secondary | ICD-10-CM

## 2013-12-03 DIAGNOSIS — R600 Localized edema: Secondary | ICD-10-CM

## 2013-12-03 DIAGNOSIS — E038 Other specified hypothyroidism: Secondary | ICD-10-CM

## 2013-12-03 DIAGNOSIS — J302 Other seasonal allergic rhinitis: Secondary | ICD-10-CM

## 2013-12-03 MED ORDER — LEVOTHYROXINE SODIUM 50 MCG PO TABS: ORAL_TABLET | ORAL | Status: DC

## 2013-12-03 NOTE — Patient Instructions (Signed)

## 2013-12-03 NOTE — Progress Notes (Signed)
  Subjective:    Patient ID: Sylvia Taylor, female    DOB: [birth year], 43 y.o.   MRN: [id number]  Patient here today for follow up of chronic medical problems. NO complaints today.  Thyroid Problem Presents for follow-up (hypothyroidism) visit. Patient reports no anxiety, cold intolerance, depressed mood, diaphoresis, diarrhea, heat intolerance, leg swelling, menstrual problem, palpitations, tremors, visual change, weight gain or weight loss. The symptoms have been stable.  allergic rhinitis Flonase, zytrec and patanase daily- combination is working well. Fluid retention Mainly when she is on her period takes lasix which resolves it.   Review of Systems  Constitutional: Negative for weight loss, weight gain and diaphoresis.  Cardiovascular: Negative for palpitations.  Gastrointestinal: Negative for diarrhea.  Endocrine: Negative for cold intolerance and heat intolerance.  Genitourinary: Negative for menstrual problem.  Neurological: Negative for tremors.  All other systems reviewed and are negative.      Objective:   Physical Exam  Constitutional: She is oriented to person, place, and time. She appears well-developed and well-nourished.  HENT:  Nose: Nose normal.  Mouth/Throat: Oropharynx is clear and moist.  Eyes: EOM are normal.  Neck: Trachea normal, normal range of motion and full passive range of motion without pain. Neck supple. No JVD present. Carotid bruit is not present. No thyromegaly present.  Cardiovascular: Normal rate, regular rhythm, normal heart sounds and intact distal pulses.  Exam reveals no gallop and no friction rub.   No murmur heard. Pulmonary/Chest: Effort normal and breath sounds normal.  Abdominal: Soft. Bowel sounds are normal. She exhibits no distension and no mass. There is no tenderness.  Musculoskeletal: Normal range of motion.  Lymphadenopathy:    She has no cervical adenopathy.  Neurological: She is alert and oriented to person, place, and time.  She has normal reflexes.  Skin: Skin is warm and dry.  Psychiatric: She has a normal mood and affect. Her behavior is normal. Judgment and thought content normal.   BP 117/87  Pulse 74  Temp(Src) 97.4 F (36.3 C) (Oral)  Ht $R'5\' 4"'Ed$  (1.626 m)  Wt 240 lb 9.6 oz (109.135 kg)  BMI 41.28 kg/m2  LMP 10/18/2013        Assessment & Plan:   1. Hypothyroidism due to acquired atrophy of thyroid - levothyroxine (SYNTHROID, LEVOTHROID) 50 MCG tablet; TAKE 1 TABLET (50 MCG TOTAL) BY MOUTH DAILY.  Dispense: 90 tablet; Refill: 1 - CMP14+EGFR - NMR, lipoprofile - Thyroid Panel With TSH  2. Bilateral edema of lower extremity Elevate when sitting   Take lasix as needed  3. Other seasonal allergic rhinitis Zyrtec and flonase as needed  4. Severe obesity (BMI >= 40) Discussed diet and exercise for person with BMI >25 Will recheck weight in 3-6 months     Labs pending Health maintenance reviewed Diet and exercise encouraged Continue all meds Follow up  In 6 months   [place], FNP

## 2013-12-04 LAB — CMP14+EGFR
ALT: 30 IU/L (ref 0–32)
AST: 19 IU/L (ref 0–40)
Albumin/Globulin Ratio: 1.4 (ref 1.1–2.5)
Albumin: 4.2 g/dL (ref 3.5–5.5)
Alkaline Phosphatase: 97 IU/L (ref 39–117)
BUN/Creatinine Ratio: 20 (ref 9–23)
BUN: 14 mg/dL (ref 6–24)
CO2: 25 mmol/L (ref 18–29)
Calcium: 9.4 mg/dL (ref 8.7–10.2)
Chloride: 98 mmol/L (ref 97–108)
Creatinine, Ser: 0.71 mg/dL (ref 0.57–1.00)
GFR calc Af Amer: 122 mL/min/{1.73_m2} (ref >59)
GFR calc non Af Amer: 106 mL/min/{1.73_m2} (ref >59)
Globulin, Total: 3 g/dL (ref 1.5–4.5)
Glucose: 74 mg/dL (ref 65–99)
Potassium: 4.7 mmol/L (ref 3.5–5.2)
Sodium: 139 mmol/L (ref 134–144)
Total Bilirubin: 0.3 mg/dL (ref 0.0–1.2)
Total Protein: 7.2 g/dL (ref 6.0–8.5)

## 2013-12-04 LAB — THYROID PANEL WITH TSH
Free Thyroxine Index: 2.7 (ref 1.2–4.9)
T3 Uptake Ratio: 28 % (ref 24–39)
T4, Total: 9.5 ug/dL (ref 4.5–12.0)
TSH: 2.83 u[IU]/mL (ref 0.450–4.500)

## 2013-12-04 LAB — NMR, LIPOPROFILE
Cholesterol: 158 mg/dL (ref 100–199)
HDL Cholesterol by NMR: 32 mg/dL — ABNORMAL LOW (ref >39)
HDL Particle Number: 25.7 umol/L — ABNORMAL LOW (ref >=30.5)
LDL Particle Number: 1180 nmol/L — ABNORMAL HIGH (ref <1000)
LDL Size: 21.2 nm (ref >20.5)
LDLC SERPL CALC-MCNC: 97 mg/dL (ref 0–99)
LP-IR Score: 64 — ABNORMAL HIGH (ref <=45)
Small LDL Particle Number: 619 nmol/L — ABNORMAL HIGH (ref <=527)
Triglycerides by NMR: 144 mg/dL (ref 0–149)

## 2014-01-10 ENCOUNTER — Telehealth: Payer: Self-pay | Admitting: Nurse Practitioner

## 2014-01-10 NOTE — Telephone Encounter (Signed)
appt given for tomorrow per patients request 

## 2014-01-11 ENCOUNTER — Encounter: Payer: Self-pay | Admitting: Nurse Practitioner

## 2014-01-11 ENCOUNTER — Ambulatory Visit (INDEPENDENT_AMBULATORY_CARE_PROVIDER_SITE_OTHER): Payer: BC Managed Care – PPO | Admitting: Nurse Practitioner

## 2014-01-11 VITALS — BP 134/91 | HR 78 | Temp 97.8°F | Ht 64.0 in | Wt 338.0 lb

## 2014-01-11 DIAGNOSIS — J0100 Acute maxillary sinusitis, unspecified: Secondary | ICD-10-CM

## 2014-01-11 DIAGNOSIS — J209 Acute bronchitis, unspecified: Secondary | ICD-10-CM

## 2014-01-11 MED ORDER — METHYLPREDNISOLONE ACETATE 80 MG/ML IJ SUSP
80.0000 mg | Freq: Once | INTRAMUSCULAR | Status: AC
  Administered 2014-01-11: 80 mg via INTRAMUSCULAR

## 2014-01-11 MED ORDER — PREDNISONE 20 MG PO TABS: ORAL_TABLET | ORAL | Status: DC

## 2014-01-11 MED ORDER — AZITHROMYCIN 250 MG PO TABS: ORAL_TABLET | ORAL | Status: DC

## 2014-01-11 NOTE — Patient Instructions (Signed)

## 2014-01-11 NOTE — Progress Notes (Signed)
Subjective:    Patient ID: Sylvia Taylor, female    DOB: 16-Jun-1970, 43 y.o.   MRN: [id number]  HPI Patient was seen in urgent  On November 1st- diagnosed with bronchitis- was given penicillin, albuterol and tessalon perles- got better but this past Friday started getting sick again- coughing and congestion.    Review of Systems  Constitutional: Negative for fever and chills.  HENT: Positive for congestion, ear pain and sinus pressure. Negative for sore throat, trouble swallowing and voice change.   Respiratory: Positive for cough and shortness of breath.   Cardiovascular: Negative.   Gastrointestinal: Negative.   Genitourinary: Negative.   Neurological: Negative.   Psychiatric/Behavioral: Negative.   All other systems reviewed and are negative.      Objective:   Physical Exam  Constitutional: She is oriented to person, place, and time. She appears well-developed and well-nourished.  HENT:  Right Ear: Hearing, tympanic membrane, external ear and ear canal normal.  Left Ear: Hearing, tympanic membrane, external ear and ear canal normal.  Nose: Mucosal edema and rhinorrhea present. Right sinus exhibits maxillary sinus tenderness. Right sinus exhibits no frontal sinus tenderness. Left sinus exhibits maxillary sinus tenderness. Left sinus exhibits no frontal sinus tenderness.  Mouth/Throat: Uvula is midline, oropharynx is clear and moist and mucous membranes are normal.  Eyes: Pupils are equal, round, and reactive to light.  Neck: Normal range of motion. Neck supple.  Cardiovascular: Normal rate, regular rhythm and normal heart sounds.   Pulmonary/Chest: Effort normal. She has wheezes (inspiratory wheezes faint throughout).  Deep dry cough  Lymphadenopathy:    She has no cervical adenopathy.  Neurological: She is alert and oriented to person, place, and time.  Skin: Skin is warm.  Psychiatric: She has a normal mood and affect. Her behavior is normal. Judgment and thought content  normal.    BP 134/91 mmHg  Pulse 78  Temp(Src) 97.8 F (36.6 C) (Oral)  Ht 5\' 4"  (1.626 m)  Wt 338 lb (153.316 kg)  BMI 57.99 kg/m2       Assessment & Plan:   1. Acute maxillary sinusitis, recurrence not specified   2. Acute bronchitis, unspecified organism    Meds ordered this encounter  Medications  . azithromycin (ZITHROMAX Z-PAK) 250 MG tablet    Sig: As directed    Dispense:  6 each    Refill:  0    Order Specific Question:  Supervising Provider    Answer:  Chipper Herb [[id number]]  . predniSONE (DELTASONE) 20 MG tablet    Sig: 2 Tablets PO at the same time daily for 5 days- do not start until tomorrow    Dispense:  10 tablet    Refill:  0    Order Specific Question:  Supervising Provider    Answer:  Chipper Herb [[id number]]  . methylPREDNISolone acetate (DEPO-MEDROL) injection 80 mg    Sig:    1. Take meds as prescribed 2. Use a cool mist humidifier especially during the winter months and when heat has been humid. 3. Use saline nose sprays frequently 4. Saline irrigations of the nose can be very helpful if done frequently.  * 4X daily for 1 week*  * Use of a nettie pot can be helpful with this. Follow directions with this* 5. Drink plenty of fluids 6. Keep thermostat turn down low 7.For any cough or congestion  Use plain Mucinex- regular strength or max strength is fine   * Children- consult with Pharmacist for dosing  8. For fever or aces or pains- take tylenol or ibuprofen appropriate for age and weight.  * for fevers greater than 101 orally you may alternate ibuprofen and tylenol every  3 hours.   Mary-Margaret Hassell Done, FNP

## 2014-03-30 ENCOUNTER — Other Ambulatory Visit: Payer: Self-pay

## 2014-03-30 DIAGNOSIS — Z1231 Encounter for screening mammogram for malignant neoplasm of breast: Secondary | ICD-10-CM

## 2014-05-31 ENCOUNTER — Other Ambulatory Visit: Payer: Self-pay | Admitting: Obstetrics and Gynecology

## 2014-06-01 LAB — CYTOLOGY - PAP

## 2014-06-06 ENCOUNTER — Ambulatory Visit (INDEPENDENT_AMBULATORY_CARE_PROVIDER_SITE_OTHER): Payer: BC Managed Care – PPO | Admitting: Nurse Practitioner

## 2014-06-06 ENCOUNTER — Ambulatory Visit
Admission: RE | Admit: 2014-06-06 | Discharge: 2014-06-06 | Disposition: A | Discharge status: Home / Self Care | Payer: BC Managed Care – PPO | Source: Ambulatory Visit

## 2014-06-06 ENCOUNTER — Encounter: Payer: Self-pay | Admitting: Nurse Practitioner

## 2014-06-06 VITALS — BP 139/97 | HR 77 | Temp 98.1°F | Ht 64.0 in | Wt 328.0 lb

## 2014-06-06 DIAGNOSIS — E034 Atrophy of thyroid (acquired): Secondary | ICD-10-CM | POA: Diagnosis not present

## 2014-06-06 DIAGNOSIS — E038 Other specified hypothyroidism: Secondary | ICD-10-CM | POA: Diagnosis not present

## 2014-06-06 DIAGNOSIS — J302 Other seasonal allergic rhinitis: Secondary | ICD-10-CM | POA: Diagnosis not present

## 2014-06-06 DIAGNOSIS — R600 Localized edema: Secondary | ICD-10-CM | POA: Diagnosis not present

## 2014-06-06 DIAGNOSIS — Z1231 Encounter for screening mammogram for malignant neoplasm of breast: Secondary | ICD-10-CM

## 2014-06-06 MED ORDER — CETIRIZINE HCL 10 MG PO TABS: 10.0000 mg | ORAL_TABLET | Freq: Every day | ORAL | Status: DC

## 2014-06-06 MED ORDER — LEVOTHYROXINE SODIUM 50 MCG PO TABS: ORAL_TABLET | ORAL | Status: DC

## 2014-06-06 MED ORDER — FLUTICASONE PROPIONATE 50 MCG/ACT NA SUSP: 2.0000 | Freq: Every day | NASAL | Status: DC

## 2014-06-06 NOTE — Patient Instructions (Signed)
Bone Health Our bones do many things. They provide structure, protect organs, anchor muscles, and store calcium. Adequate calcium in your diet and weight-bearing physical activity help build strong bones, improve bone amounts, and may reduce the risk of weakening of bones (osteoporosis) later in life. PEAK BONE MASS By age 42, the average woman has acquired most of her skeletal bone mass. A large decline occurs in older adults which increases the risk of osteoporosis. In women this occurs around the time of menopause. It is important for young girls to reach their peak bone mass in order to maintain bone health throughout life. A person with high bone mass as a young adult will be more likely to have a higher bone mass later in life. Not enough calcium consumption and physical activity early on could result in a failure to achieve optimum bone mass in adulthood. OSTEOPOROSIS Osteoporosis is a disease of the bones. It is defined as low bone mass with deterioration of bone structure. Osteoporosis leads to an increase risk of fractures with falls. These fractures commonly happen in the wrist, hip, and spine. While men and women of all ages and background can develop osteoporosis, some of the risk factors for osteoporosis are:  Female.  White.  Postmenopausal.  Older adults.  Small in body size.  Eating a diet low in calcium.  Physically inactive.  Smoking.  Use of some medications.  Family history. CALCIUM Calcium is a mineral needed by the body for healthy bones, teeth, and proper function of the heart, muscles, and nerves. The body cannot produce calcium so it must be absorbed through food. Good sources of calcium include:  Dairy products (low fat or nonfat milk, cheese, and yogurt).  Dark green leafy vegetables (bok choy and broccoli).  Calcium fortified foods (orange juice, cereal, bread, soy beverages, and tofu products).  Nuts (almonds). Recommended amounts of calcium vary  for individuals. RECOMMENDED CALCIUM INTAKES Age and Amount in mg per day  Children 1 to 3 years / 700 mg  Children 4 to 8 years / 1,000 mg  Children 9 to 13 years / 1,300 mg  Teens 14 to 18 years / 1,300 mg  Adults 19 to 50 years / 1,000 mg  Adult women 51 to 70 years / 1,200 mg  Adults 71 years and older / 1,200 mg  Pregnant and breastfeeding teens / 1,300 mg  Pregnant and breastfeeding adults / 1,000 mg Vitamin D also plays an important role in healthy bone development. Vitamin D helps in the absorption of calcium. WEIGHT-BEARING PHYSICAL ACTIVITY Regular physical activity has many positive health benefits. Benefits include strong bones. Weight-bearing physical activity early in life is important in reaching peak bone mass. Weight-bearing physical activities cause muscles and bones to work against gravity. Some examples of weight bearing physical activities include:  Walking, jogging, or running.  Field Hockey.  Jumping rope.  Dancing.  Soccer.  Tennis or Racquetball.  Stair climbing.  Basketball.  Hiking.  Weight lifting.  Aerobic fitness classes. Including weight-bearing physical activity into an exercise plan is a great way to keep bones healthy. Adults: Engage in at least 30 minutes of moderate physical activity on most, preferably all, days of the week. Children: Engage in at least 60 minutes of moderate physical activity on most, preferably all, days of the week. FOR MORE INFORMATION [place] Department of Agriculture, Center for Nutrition Policy and Promotion: www.cnpp.usda.gov National Osteoporosis Foundation: www.nof.org Document Released: 04/20/2003 Document Revised: 05/25/2012 Document Reviewed: 07/20/2008 Exit[employer] Patient Information   2015 [employer]. This information is not intended to replace advice given to you by your health care provider. Make sure you discuss any questions you have with your health care provider.  

## 2014-06-06 NOTE — Progress Notes (Signed)
  Subjective:    Patient ID: Sylvia Taylor, female    DOB: [birth year], 44 y.o.   MRN: [id number]  Patient here today for follow up of chronic medical problems. NO complaints today.  Thyroid Problem Visit type: hypothyroidism. Patient reports no cold intolerance, diaphoresis, diarrhea, heat intolerance, menstrual problem, palpitations, tremors or visual change.  allergic rhinitis Flonase, zytrec and patanase daily- combination is working well. Fluid retention Mainly when she is on her period takes lasix which resolves it.   Review of Systems  Constitutional: Negative for diaphoresis.  Cardiovascular: Negative for palpitations.  Gastrointestinal: Negative for diarrhea.  Endocrine: Negative for cold intolerance and heat intolerance.  Genitourinary: Negative for menstrual problem.  Neurological: Negative for tremors.  All other systems reviewed and are negative.      Objective:   Physical Exam  Constitutional: She is oriented to person, place, and time. She appears well-developed and well-nourished.  HENT:  Nose: Nose normal.  Mouth/Throat: Oropharynx is clear and moist.  Eyes: EOM are normal.  Neck: Trachea normal, normal range of motion and full passive range of motion without pain. Neck supple. No JVD present. Carotid bruit is not present. No thyromegaly present.  Cardiovascular: Normal rate, regular rhythm, normal heart sounds and intact distal pulses.  Exam reveals no gallop and no friction rub.   No murmur heard. Pulmonary/Chest: Effort normal and breath sounds normal.  Abdominal: Soft. Bowel sounds are normal. She exhibits no distension and no mass. There is no tenderness.  Musculoskeletal: Normal range of motion.  Lymphadenopathy:    She has no cervical adenopathy.  Neurological: She is alert and oriented to person, place, and time. She has normal reflexes.  Skin: Skin is warm and dry.  Psychiatric: She has a normal mood and affect. Her behavior is normal. Judgment and  thought content normal.   BP 139/97 mmHg  Pulse 77  Temp(Src) 98.1 F (36.7 C) (Oral)  Ht _0  (1.626 m)  Wt 328 lb (148.78 kg)  BMI 56.27 kg/m2        Assessment & Plan:   1. Hypothyroidism due to acquired atrophy of thyroid - levothyroxine (SYNTHROID, LEVOTHROID) 50 MCG tablet; TAKE 1 TABLET (50 MCG TOTAL) BY MOUTH DAILY.  Dispense: 90 tablet; Refill: 1 - CMP14+EGFR - NMR, lipoprofile - Thyroid Panel With TSH  2. Bilateral edema of lower extremity Elevate legs when sitting  3. Other seasonal allergic rhinitis - fluticasone (FLONASE) 50 MCG/ACT nasal spray; Place 2 sprays into both nostrils daily.  Dispense: 16 g; Refill: 5 - cetirizine (ZYRTEC) 10 MG tablet; Take 1 tablet (10 mg total) by mouth daily.  Dispense: 90 tablet; Refill: 1  4. Severe obesity (BMI >= 40) Discussed diet and exercise for person with BMI >25 Will recheck weight in 3-6 months     Labs pending Health maintenance reviewed Diet and exercise encouraged Continue all meds Follow up  In 6 months   [place], FNP

## 2014-06-07 LAB — THYROID PANEL WITH TSH
Free Thyroxine Index: 2.4 (ref 1.2–4.9)
T3 Uptake Ratio: 24 % (ref 24–39)
T4, Total: 10.2 ug/dL (ref 4.5–12.0)
TSH: 2.72 u[IU]/mL (ref 0.450–4.500)

## 2014-06-07 LAB — CMP14+EGFR
ALT: 25 IU/L (ref 0–32)
AST: 19 IU/L (ref 0–40)
Albumin/Globulin Ratio: 1.4 (ref 1.1–2.5)
Albumin: 4.5 g/dL (ref 3.5–5.5)
Alkaline Phosphatase: 101 IU/L (ref 39–117)
BUN/Creatinine Ratio: 19 (ref 9–23)
BUN: 14 mg/dL (ref 6–24)
Bilirubin Total: 0.3 mg/dL (ref 0.0–1.2)
CO2: 27 mmol/L (ref 18–29)
Calcium: 9.6 mg/dL (ref 8.7–10.2)
Chloride: 99 mmol/L (ref 97–108)
Creatinine, Ser: 0.75 mg/dL (ref 0.57–1.00)
GFR calc Af Amer: 114 mL/min/{1.73_m2} (ref >59)
GFR calc non Af Amer: 99 mL/min/{1.73_m2} (ref >59)
Globulin, Total: 3.2 g/dL (ref 1.5–4.5)
Glucose: 92 mg/dL (ref 65–99)
Potassium: 5 mmol/L (ref 3.5–5.2)
Sodium: 140 mmol/L (ref 134–144)
Total Protein: 7.7 g/dL (ref 6.0–8.5)

## 2014-06-07 LAB — NMR, LIPOPROFILE
Cholesterol: 177 mg/dL (ref 100–199)
HDL Cholesterol by NMR: 43 mg/dL (ref >39)
HDL Particle Number: 32.4 umol/L (ref >=30.5)
LDL Particle Number: 1208 nmol/L — ABNORMAL HIGH (ref <1000)
LDL Size: 21.2 nm (ref >20.5)
LDL-C: 106 mg/dL — ABNORMAL HIGH (ref 0–99)
LP-IR Score: 50 — ABNORMAL HIGH (ref <=45)
Small LDL Particle Number: 563 nmol/L — ABNORMAL HIGH (ref <=527)
Triglycerides by NMR: 140 mg/dL (ref 0–149)

## 2014-06-30 ENCOUNTER — Encounter: Payer: Self-pay | Admitting: Nurse Practitioner

## 2014-06-30 ENCOUNTER — Ambulatory Visit (INDEPENDENT_AMBULATORY_CARE_PROVIDER_SITE_OTHER): Payer: BC Managed Care – PPO

## 2014-06-30 ENCOUNTER — Ambulatory Visit (INDEPENDENT_AMBULATORY_CARE_PROVIDER_SITE_OTHER): Payer: BC Managed Care – PPO | Admitting: Nurse Practitioner

## 2014-06-30 VITALS — BP 141/94 | HR 78 | Temp 97.0°F | Ht 64.0 in | Wt 332.0 lb

## 2014-06-30 DIAGNOSIS — M25561 Pain in right knee: Secondary | ICD-10-CM

## 2014-06-30 MED ORDER — NAPROXEN 500 MG PO TABS: 500.0000 mg | ORAL_TABLET | Freq: Two times a day (BID) | ORAL | Status: DC

## 2014-06-30 NOTE — Progress Notes (Signed)
   Subjective:    Patient ID: Sylvia Taylor, female    DOB: [birth year], 44 y.o.   MRN: [id number]  HPI patient in c/o right knee pain- started about 2 weeks ago- now has pain going down the back of her leg. Rates pain 7/10- sitting and then trying to stand increases pain- whne drivig pain goes up back of thigh.    Review of Systems  Constitutional: Negative.   HENT: Negative.   Respiratory: Negative.   Cardiovascular: Negative.   Genitourinary: Negative.   Neurological: Negative.   Psychiatric/Behavioral: Negative.   All other systems reviewed and are negative.      Objective:   Physical Exam  Constitutional: She is oriented to person, place, and time. She appears well-developed and well-nourished.  Cardiovascular: Normal rate, regular rhythm and normal heart sounds.   Pulmonary/Chest: Effort normal and breath sounds normal.  Musculoskeletal:  FROM of right knee with pain on flexion and extension All ligaments intact No edema No patella tenderness  Neurological: She is oriented to person, place, and time. She has normal reflexes.  Skin: Skin is warm and dry.  Psychiatric: She has a normal mood and affect. Her behavior is normal. Judgment and thought content normal.    BP 141/94 mmHg  Pulse 78  Temp(Src) 97 F (36.1 C) (Oral)  Ht 5\' 4"  (1.626 m)  Wt 332 lb (150.594 kg)  BMI 56.96 kg/m2  Right knee xray- normal-Preliminary reading by Ronnald Collum, FNP  [facility]      Assessment & Plan:   1. Right knee pain    Meds ordered this encounter  Medications  . naproxen (NAPROSYN) 500 MG tablet    Sig: Take 1 tablet (500 mg total) by mouth 2 (two) times daily with a meal.    Dispense:  60 tablet    Refill:  2    Order Specific Question:  Supervising Provider    Answer:  Joycelyn Man   Rest Ace wrap Elevate when sitting RTO prn  Mary-Margaret Hassell Done, FNP

## 2014-06-30 NOTE — Patient Instructions (Signed)

## 2014-07-04 ENCOUNTER — Telehealth: Payer: Self-pay | Admitting: Nurse Practitioner

## 2014-07-04 DIAGNOSIS — M25561 Pain in right knee: Secondary | ICD-10-CM

## 2014-07-04 NOTE — Telephone Encounter (Signed)
Referral made 

## 2014-07-04 NOTE — Telephone Encounter (Signed)
Patient aware.

## 2014-07-08 DIAGNOSIS — E663 Overweight: Secondary | ICD-10-CM | POA: Insufficient documentation

## 2014-07-08 DIAGNOSIS — E079 Disorder of thyroid, unspecified: Secondary | ICD-10-CM | POA: Insufficient documentation

## 2014-07-08 DIAGNOSIS — M25561 Pain in right knee: Secondary | ICD-10-CM | POA: Insufficient documentation

## 2014-09-05 DIAGNOSIS — S8391XA Sprain of unspecified site of right knee, initial encounter: Secondary | ICD-10-CM | POA: Insufficient documentation

## 2014-09-12 HISTORY — PX: KNEE ARTHROSCOPY: SUR90

## 2014-09-16 DIAGNOSIS — S83241D Other tear of medial meniscus, current injury, right knee, subsequent encounter: Secondary | ICD-10-CM | POA: Insufficient documentation

## 2014-09-26 DIAGNOSIS — Z9889 Other specified postprocedural states: Secondary | ICD-10-CM | POA: Insufficient documentation

## 2014-11-01 ENCOUNTER — Other Ambulatory Visit: Payer: Self-pay | Admitting: Nurse Practitioner

## 2014-11-28 ENCOUNTER — Ambulatory Visit (INDEPENDENT_AMBULATORY_CARE_PROVIDER_SITE_OTHER): Payer: BC Managed Care – PPO

## 2014-11-28 ENCOUNTER — Encounter: Payer: Self-pay | Admitting: Nurse Practitioner

## 2014-11-28 ENCOUNTER — Ambulatory Visit (INDEPENDENT_AMBULATORY_CARE_PROVIDER_SITE_OTHER): Payer: BC Managed Care – PPO | Admitting: Nurse Practitioner

## 2014-11-28 VITALS — BP 139/96 | HR 94 | Temp 97.9°F | Ht 64.0 in | Wt 337.0 lb

## 2014-11-28 DIAGNOSIS — R1032 Left lower quadrant pain: Secondary | ICD-10-CM

## 2014-11-28 DIAGNOSIS — R300 Dysuria: Secondary | ICD-10-CM | POA: Diagnosis not present

## 2014-11-28 LAB — POCT URINALYSIS DIPSTICK
Bilirubin, UA: NEGATIVE
Blood, UA: NEGATIVE
Glucose, UA: NEGATIVE
Ketones, UA: NEGATIVE
Leukocytes, UA: NEGATIVE
Nitrite, UA: NEGATIVE
Protein, UA: NEGATIVE
Spec Grav, UA: 1.01
Urobilinogen, UA: NEGATIVE
pH, UA: 5

## 2014-11-28 LAB — POCT UA - MICROSCOPIC ONLY
Casts, Ur, LPF, POC: NEGATIVE
Crystals, Ur, HPF, POC: NEGATIVE
Mucus, UA: NEGATIVE
RBC, urine, microscopic: NEGATIVE
WBC, Ur, HPF, POC: NEGATIVE
Yeast, UA: NEGATIVE

## 2014-11-28 MED ORDER — NAPROXEN 500 MG PO TABS: 500.0000 mg | ORAL_TABLET | Freq: Two times a day (BID) | ORAL | Status: DC

## 2014-11-28 NOTE — Patient Instructions (Signed)

## 2014-11-28 NOTE — Progress Notes (Signed)
Subjective:    Patient ID: Sylvia Taylor, female    DOB: February 16, 1970, 44 y.o.   MRN: [id number]  HPI: Reports she started feeling bad Saturday, was feverish with low grade 99.8 and generalized fatigue. Took motrin and tylenol and felt alittle better. Then developed left sided abd pain and flank pain. Feels nauseated, but no vomiting. No dysuria but notes pressure in the left side of her groin. Pain 5/10.    Review of Systems  Constitutional: Positive for fever. Negative for chills, appetite change and fatigue.  Respiratory: Negative for shortness of breath and wheezing.   Cardiovascular: Negative for chest pain and palpitations.  Gastrointestinal: Positive for nausea. Negative for vomiting, abdominal pain, diarrhea and constipation.  Genitourinary: Positive for flank pain and pelvic pain. Negative for dysuria, urgency and hematuria.  All other systems reviewed and are negative.      Objective:   Physical Exam  Constitutional: She appears well-developed and well-nourished.  Cardiovascular: Normal rate, regular rhythm, normal heart sounds and intact distal pulses.   Pulmonary/Chest: Effort normal and breath sounds normal.  Abdominal: She exhibits no mass. There is tenderness. There is no rebound and no guarding.  LLQ and left groin tenderness with deep palpation  Genitourinary:  Bimanual pelvic exam - no palpable masses or discomfort on palpation.  Musculoskeletal: She exhibits tenderness.  Low left sided flank area  Neurological: She is alert.  Skin: Skin is warm and dry.  Psychiatric: She has a normal mood and affect. Her behavior is normal. Judgment and thought content normal.   KUB- mild stool burden throughout colon    BP 139/96 mmHg  Pulse 94  Temp(Src) 97.9 F (36.6 C) (Oral)  Ht 5\' 4"  (1.626 m)  Wt 337 lb (152.862 kg)  BMI 57.82 kg/m2   Results for orders placed or performed in visit on 11/28/14  POCT urinalysis dipstick  Result Value Ref Range   Color, UA yellow     Clarity, UA clear    Glucose, UA neg    Bilirubin, UA neg    Ketones, UA neg    Spec Grav, UA 1.010    Blood, UA neg    pH, UA 5.0    Protein, UA neg    Urobilinogen, UA negative    Nitrite, UA neg    Leukocytes, UA Negative Negative     Assessment & Plan:   1. Dysuria   2. Left lower quadrant pain    Orders Placed This Encounter  Procedures  . DG Abd 1 View    Standing Status: Future     Number of Occurrences: 1     Standing Expiration Date: 01/28/2016    Order Specific Question:  Reason for Exam (SYMPTOM  OR DIAGNOSIS REQUIRED)    Answer:  left lower qraduant pain    Order Specific Question:  Is the patient pregnant?    Answer:  No    Order Specific Question:  Preferred imaging location?    Answer:  [facility]  . CT Abdomen Pelvis W Contrast    Standing Status: Future     Number of Occurrences:      Standing Expiration Date: 02/28/2016    Order Specific Question:  Reason for Exam (SYMPTOM  OR DIAGNOSIS REQUIRED)    Answer:  left lower quadrant pain    Order Specific Question:  Is the patient pregnant?    Answer:  No    Order Specific Question:  Preferred imaging location?    Answer:  [facility]  . CBC with Differential/Platelet  . POCT UA - Microscopic Only  . POCT urinalysis dipstick   Meds ordered this encounter  Medications  . naproxen (NAPROSYN) 500 MG tablet    Sig: Take 1 tablet (500 mg total) by mouth 2 (two) times daily with a meal.    Dispense:  60 tablet    Refill:  1    Order Specific Question:  Supervising Provider    Answer:  Joycelyn Man   Will order CT first thing in AM - so be ready to go get Rest this evening Take naprosyn as rx- if pain worsens to [place], FNP

## 2014-11-29 ENCOUNTER — Other Ambulatory Visit: Payer: Self-pay | Admitting: Nurse Practitioner

## 2014-11-29 ENCOUNTER — Ambulatory Visit (HOSPITAL_COMMUNITY)
Admission: RE | Admit: 2014-11-29 | Discharge: 2014-11-29 | Disposition: A | Discharge status: Home / Self Care | Payer: BC Managed Care – PPO | Source: Ambulatory Visit | Attending: Nurse Practitioner | Admitting: Nurse Practitioner

## 2014-11-29 DIAGNOSIS — K5730 Diverticulosis of large intestine without perforation or abscess without bleeding: Secondary | ICD-10-CM | POA: Diagnosis not present

## 2014-11-29 DIAGNOSIS — M5136 Other intervertebral disc degeneration, lumbar region: Secondary | ICD-10-CM | POA: Insufficient documentation

## 2014-11-29 DIAGNOSIS — R1032 Left lower quadrant pain: Secondary | ICD-10-CM | POA: Diagnosis present

## 2014-11-29 LAB — CBC WITH DIFFERENTIAL/PLATELET
Basophils Absolute: 0 10*3/uL (ref 0.0–0.2)
Basos: 0 %
EOS (ABSOLUTE): 0.2 10*3/uL (ref 0.0–0.4)
Eos: 2 %
Hematocrit: 38.8 % (ref 34.0–46.6)
Hemoglobin: 12.9 g/dL (ref 11.1–15.9)
Immature Grans (Abs): 0 10*3/uL (ref 0.0–0.1)
Immature Granulocytes: 0 %
Lymphocytes Absolute: 2.8 10*3/uL (ref 0.7–3.1)
Lymphs: 23 %
MCH: 27.9 pg (ref 26.6–33.0)
MCHC: 33.2 g/dL (ref 31.5–35.7)
MCV: 84 fL (ref 79–97)
Monocytes Absolute: 0.7 10*3/uL (ref 0.1–0.9)
Monocytes: 6 %
Neutrophils Absolute: 8.4 10*3/uL — ABNORMAL HIGH (ref 1.4–7.0)
Neutrophils: 69 %
Platelets: 214 10*3/uL (ref 150–379)
RBC: 4.63 x10E6/uL (ref 3.77–5.28)
RDW: 13.7 % (ref 12.3–15.4)
WBC: 12.2 10*3/uL — ABNORMAL HIGH (ref 3.4–10.8)

## 2014-11-29 MED ORDER — CIPROFLOXACIN HCL 250 MG PO TABS: 250.0000 mg | ORAL_TABLET | Freq: Two times a day (BID) | ORAL | Status: DC

## 2014-11-29 MED ORDER — IOHEXOL 300 MG/ML  SOLN
100.0000 mL | Freq: Once | INTRAMUSCULAR | Status: AC | PRN
  Administered 2014-11-29: 100 mL via INTRAVENOUS

## 2014-11-29 MED ORDER — METRONIDAZOLE 500 MG PO TABS: 500.0000 mg | ORAL_TABLET | Freq: Two times a day (BID) | ORAL | Status: DC

## 2014-11-29 NOTE — Addendum Note (Signed)
Addended by: Chevis Pretty on: 11/29/2014 08:12 AM   Modules accepted: Orders

## 2014-12-01 ENCOUNTER — Telehealth: Payer: Self-pay | Admitting: Nurse Practitioner

## 2014-12-01 NOTE — Telephone Encounter (Signed)
Called patient she is c/o low garde fever off and on- otherwise pain is better. Fever driving her crazy- Take motrin and if not better come in to be seen

## 2015-01-17 ENCOUNTER — Telehealth: Payer: Self-pay | Admitting: Nurse Practitioner

## 2015-02-27 DIAGNOSIS — M1711 Unilateral primary osteoarthritis, right knee: Secondary | ICD-10-CM | POA: Insufficient documentation

## 2015-03-14 ENCOUNTER — Ambulatory Visit (INDEPENDENT_AMBULATORY_CARE_PROVIDER_SITE_OTHER): Payer: BC Managed Care – PPO | Admitting: Nurse Practitioner

## 2015-03-14 ENCOUNTER — Encounter: Payer: Self-pay | Admitting: Nurse Practitioner

## 2015-03-14 VITALS — BP 128/84 | HR 82 | Temp 97.0°F | Ht 64.0 in | Wt 332.0 lb

## 2015-03-14 DIAGNOSIS — R079 Chest pain, unspecified: Secondary | ICD-10-CM | POA: Diagnosis not present

## 2015-03-14 DIAGNOSIS — R0602 Shortness of breath: Secondary | ICD-10-CM

## 2015-03-14 DIAGNOSIS — J209 Acute bronchitis, unspecified: Secondary | ICD-10-CM | POA: Diagnosis not present

## 2015-03-14 MED ORDER — AZITHROMYCIN 250 MG PO TABS
ORAL_TABLET | ORAL | Status: DC
Start: 2015-03-14 — End: 2015-05-05

## 2015-03-14 NOTE — Progress Notes (Signed)
   Subjective:    Patient ID: Sylvia Taylor, female    DOB: 02-07-1971, 45 y.o.   MRN: [id number]  HPI Patient in c/o SOB- [facility] and steroids. Is some better but says chest feels heavy like she cant raise her chest to get a deep breathe. Does have a cough. Denies fever or congestion. This has been going on for a week.    Review of Systems  Constitutional: Negative for fever and chills.  HENT: Negative for congestion, ear pain and sore throat.   Respiratory: Positive for cough and shortness of breath.   Cardiovascular: Negative.   Gastrointestinal: Negative.   Genitourinary: Negative.   Neurological: Negative.   Psychiatric/Behavioral: Negative.   All other systems reviewed and are negative.      Objective:   Physical Exam  Constitutional: She is oriented to person, place, and time. She appears well-developed and well-nourished.  HENT:  Right Ear: External ear normal.  Left Ear: External ear normal.  Nose: Nose normal.  Mouth/Throat: Oropharynx is clear and moist.  Eyes: Pupils are equal, round, and reactive to light.  Neck: Normal range of motion. Neck supple.  Cardiovascular: Normal rate, regular rhythm and normal heart sounds.   Pulmonary/Chest: Effort normal and breath sounds normal.  Neurological: She is alert and oriented to person, place, and time.  Skin: Skin is warm.  Psychiatric: She has a normal mood and affect. Her behavior is normal. Judgment and thought content normal.   BP 128/84 mmHg  Pulse 82  Temp(Src) 97 F (36.1 C) (Oral)  Ht 5\' 4"  (1.626 m)  Wt 332 lb (150.594 kg)  BMI 56.96 kg/m2  SpO2 98%  EKG- Kerry Hough, FNP     Assessment & Plan:   1. Chest pain, unspecified chest pain type   2. SOB (shortness of breath)   3. Acute bronchitis, unspecified organism    Meds ordered this encounter  Medications  . azithromycin (ZITHROMAX Z-PAK) 250 MG tablet    Sig: As directed    Dispense:  1 each      Refill:  0    Order Specific Question:  Supervising Provider    Answer:  Chipper Herb [[id number]]   symbicort 80/4.5 2 puffs bid until better Force fluids If no better in 1 week let me know  Mary-Margaret Hassell Done, FNP

## 2015-03-14 NOTE — Patient Instructions (Signed)

## 2015-04-27 ENCOUNTER — Other Ambulatory Visit: Payer: Self-pay | Admitting: Nurse Practitioner

## 2015-05-05 ENCOUNTER — Ambulatory Visit (INDEPENDENT_AMBULATORY_CARE_PROVIDER_SITE_OTHER): Payer: BC Managed Care – PPO | Admitting: Nurse Practitioner

## 2015-05-05 ENCOUNTER — Encounter: Payer: Self-pay | Admitting: Nurse Practitioner

## 2015-05-05 VITALS — BP 123/83 | HR 78 | Temp 97.3°F | Ht 64.0 in | Wt 337.0 lb

## 2015-05-05 DIAGNOSIS — R600 Localized edema: Secondary | ICD-10-CM

## 2015-05-05 DIAGNOSIS — J302 Other seasonal allergic rhinitis: Secondary | ICD-10-CM | POA: Diagnosis not present

## 2015-05-05 DIAGNOSIS — E038 Other specified hypothyroidism: Secondary | ICD-10-CM | POA: Diagnosis not present

## 2015-05-05 DIAGNOSIS — E034 Atrophy of thyroid (acquired): Secondary | ICD-10-CM

## 2015-05-05 MED ORDER — FLUTICASONE PROPIONATE 50 MCG/ACT NA SUSP: 2.0000 | Freq: Every day | NASAL | Status: DC

## 2015-05-05 MED ORDER — LEVOTHYROXINE SODIUM 50 MCG PO TABS: ORAL_TABLET | ORAL | Status: DC

## 2015-05-05 MED ORDER — CETIRIZINE HCL 10 MG PO TABS: 10.0000 mg | ORAL_TABLET | Freq: Every day | ORAL | Status: DC

## 2015-05-05 NOTE — Progress Notes (Signed)
   Subjective:    Patient ID: Sylvia Taylor, female    DOB: [birth year], 45 y.o.   MRN: [id number]  HPI    Review of Systems     Objective:   Physical Exam        Assessment & Plan:

## 2015-05-05 NOTE — Patient Instructions (Signed)
Health Maintenance, Female Adopting a healthy lifestyle and getting preventive care can go a long way to promote health and wellness. Talk with your health care provider about what schedule of regular examinations is right for you. This is a good chance for you to check in with your provider about disease prevention and staying healthy. In between checkups, there are plenty of things you can do on your own. Experts have done a lot of research about which lifestyle changes and preventive measures are most likely to keep you healthy. Ask your health care provider for more information. WEIGHT AND DIET  Eat a healthy diet  Be sure to include plenty of vegetables, fruits, low-fat dairy products, and lean protein.  Do not eat a lot of foods high in solid fats, added sugars, or salt.  Get regular exercise. This is one of the most important things you can do for your health.  Most adults should exercise for at least 150 minutes each week. The exercise should increase your heart rate and make you sweat (moderate-intensity exercise).  Most adults should also do strengthening exercises at least twice a week. This is in addition to the moderate-intensity exercise.  Maintain a healthy weight  Body mass index (BMI) is a measurement that can be used to identify possible weight problems. It estimates body fat based on height and weight. Your health care provider can help determine your BMI and help you achieve or maintain a healthy weight.  For females 20 years of age and older:   A BMI below 18.5 is considered underweight.  A BMI of 18.5 to 24.9 is normal.  A BMI of 25 to 29.9 is considered overweight.  A BMI of 30 and above is considered obese.  Watch levels of cholesterol and blood lipids  You should start having your blood tested for lipids and cholesterol at 43 years of age, then have this test every 5 years.  You may need to have your cholesterol levels checked more often if:  Your lipid  or cholesterol levels are high.  You are older than 43 years of age.  You are at high risk for heart disease.  CANCER SCREENING   Lung Cancer  Lung cancer screening is recommended for adults 55-80 years old who are at high risk for lung cancer because of a history of smoking.  A yearly low-dose CT scan of the lungs is recommended for people who:  Currently smoke.  Have quit within the past 15 years.  Have at least a 30-pack-year history of smoking. A pack year is smoking an average of one pack of cigarettes a day for 1 year.  Yearly screening should continue until it has been 15 years since you quit.  Yearly screening should stop if you develop a health problem that would prevent you from having lung cancer treatment.  Breast Cancer  Practice breast self-awareness. This means understanding how your breasts normally appear and feel.  It also means doing regular breast self-exams. Let your health care provider know about any changes, no matter how small.  If you are in your 20s or 30s, you should have a clinical breast exam (CBE) by a health care provider every 1-3 years as part of a regular health exam.  If you are 40 or older, have a CBE every year. Also consider having a breast X-ray (mammogram) every year.  If you have a family history of breast cancer, talk to your health care provider about genetic screening.  If you   are at high risk for breast cancer, talk to your health care provider about having an MRI and a mammogram every year.  Breast cancer gene (BRCA) assessment is recommended for women who have family members with BRCA-related cancers. BRCA-related cancers include:  Breast.  Ovarian.  Tubal.  Peritoneal cancers.  Results of the assessment will determine the need for genetic counseling and BRCA1 and BRCA2 testing. Cervical Cancer Your health care provider may recommend that you be screened regularly for cancer of the pelvic organs (ovaries, uterus, and  vagina). This screening involves a pelvic examination, including checking for microscopic changes to the surface of your cervix (Pap test). You may be encouraged to have this screening done every 3 years, beginning at age 21.  For women ages 30-65, health care providers may recommend pelvic exams and Pap testing every 3 years, or they may recommend the Pap and pelvic exam, combined with testing for human papilloma virus (HPV), every 5 years. Some types of HPV increase your risk of cervical cancer. Testing for HPV may also be done on women of any age with unclear Pap test results.  Other health care providers may not recommend any screening for nonpregnant women who are considered low risk for pelvic cancer and who do not have symptoms. Ask your health care provider if a screening pelvic exam is right for you.  If you have had past treatment for cervical cancer or a condition that could lead to cancer, you need Pap tests and screening for cancer for at least 20 years after your treatment. If Pap tests have been discontinued, your risk factors (such as having a new sexual partner) need to be reassessed to determine if screening should resume. Some women have medical problems that increase the chance of getting cervical cancer. In these cases, your health care provider may recommend more frequent screening and Pap tests. Colorectal Cancer  This type of cancer can be detected and often prevented.  Routine colorectal cancer screening usually begins at 43 years of age and continues through 43 years of age.  Your health care provider may recommend screening at an earlier age if you have risk factors for colon cancer.  Your health care provider may also recommend using home test kits to check for hidden blood in the stool.  A small camera at the end of a tube can be used to examine your colon directly (sigmoidoscopy or colonoscopy). This is done to check for the earliest forms of colorectal  cancer.  Routine screening usually begins at age 50.  Direct examination of the colon should be repeated every 5-10 years through 43 years of age. However, you may need to be screened more often if early forms of precancerous polyps or small growths are found. Skin Cancer  Check your skin from head to toe regularly.  Tell your health care provider about any new moles or changes in moles, especially if there is a change in a mole's shape or color.  Also tell your health care provider if you have a mole that is larger than the size of a pencil eraser.  Always use sunscreen. Apply sunscreen liberally and repeatedly throughout the day.  Protect yourself by wearing long sleeves, pants, a wide-brimmed hat, and sunglasses whenever you are outside. HEART DISEASE, DIABETES, AND HIGH BLOOD PRESSURE   High blood pressure causes heart disease and increases the risk of stroke. High blood pressure is more likely to develop in:  People who have blood pressure in the high end   of the normal range (130-139/85-89 mm Hg).  People who are overweight or obese.  People who are African American.  If you are 38-23 years of age, have your blood pressure checked every 3-5 years. If you are 61 years of age or older, have your blood pressure checked every year. You should have your blood pressure measured twice--once when you are at a hospital or clinic, and once when you are not at a hospital or clinic. Record the average of the two measurements. To check your blood pressure when you are not at a hospital or clinic, you can use:  An automated blood pressure machine at a pharmacy.  A home blood pressure monitor.  If you are between 45 years and 39 years old, ask your health care provider if you should take aspirin to prevent strokes.  Have regular diabetes screenings. This involves taking a blood sample to check your fasting blood sugar level.  If you are at a normal weight and have a low risk for diabetes,  have this test once every three years after 43 years of age.  If you are overweight and have a high risk for diabetes, consider being tested at a younger age or more often. PREVENTING INFECTION  Hepatitis B  If you have a higher risk for hepatitis B, you should be screened for this virus. You are considered at high risk for hepatitis B if:  You were born in a country where hepatitis B is common. Ask your health care provider which countries are considered high risk.  Your parents were born in a high-risk country, and you have not been immunized against hepatitis B (hepatitis B vaccine).  You have HIV or AIDS.  You use needles to inject street drugs.  You live with someone who has hepatitis B.  You have had sex with someone who has hepatitis B.  You get hemodialysis treatment.  You take certain medicines for conditions, including cancer, organ transplantation, and autoimmune conditions. Hepatitis C  Blood testing is recommended for:  Everyone born from [birth year] through 1963.  Anyone with known risk factors for hepatitis C. Sexually transmitted infections (STIs)  You should be screened for sexually transmitted infections (STIs) including gonorrhea and chlamydia if:  You are sexually active and are younger than 43 years of age.  You are older than 43 years of age and your health care provider tells you that you are at risk for this type of infection.  Your sexual activity has changed since you were last screened and you are at an increased risk for chlamydia or gonorrhea. Ask your health care provider if you are at risk.  If you do not have HIV, but are at risk, it may be recommended that you take a prescription medicine daily to prevent HIV infection. This is called pre-exposure prophylaxis (PrEP). You are considered at risk if:  You are sexually active and do not regularly use condoms or know the HIV status of your partner(s).  You take drugs by injection.  You are sexually  active with a partner who has HIV. Talk with your health care provider about whether you are at high risk of being infected with HIV. If you choose to begin PrEP, you should first be tested for HIV. You should then be tested every 3 months for as long as you are taking PrEP.  PREGNANCY   If you are premenopausal and you may become pregnant, ask your health care provider about preconception counseling.  If you may  become pregnant, take 400 to 800 micrograms (mcg) of folic acid every day.  If you want to prevent pregnancy, talk to your health care provider about birth control (contraception). OSTEOPOROSIS AND MENOPAUSE   Osteoporosis is a disease in which the bones lose minerals and strength with aging. This can result in serious bone fractures. Your risk for osteoporosis can be identified using a bone density scan.  If you are 61 years of age or older, or if you are at risk for osteoporosis and fractures, ask your health care provider if you should be screened.  Ask your health care provider whether you should take a calcium or vitamin D supplement to lower your risk for osteoporosis.  Menopause may have certain physical symptoms and risks.  Hormone replacement therapy may reduce some of these symptoms and risks. Talk to your health care provider about whether hormone replacement therapy is right for you.  HOME CARE INSTRUCTIONS   Schedule regular health, dental, and eye exams.  Stay current with your immunizations.   Do not use any tobacco products including cigarettes, chewing tobacco, or electronic cigarettes.  If you are pregnant, do not drink alcohol.  If you are breastfeeding, limit how much and how often you drink alcohol.  Limit alcohol intake to no more than 1 drink per day for nonpregnant women. One drink equals 12 ounces of beer, 5 ounces of wine, or 1 ounces of hard liquor.  Do not use street drugs.  Do not share needles.  Ask your health care provider for help if  you need support or information about quitting drugs.  Tell your health care provider if you often feel depressed.  Tell your health care provider if you have ever been abused or do not feel safe at home.   This information is not intended to replace advice given to you by your health care provider. Make sure you discuss any questions you have with your health care provider.   Document Released: 08/13/2010 Document Revised: 02/18/2014 Document Reviewed: 12/30/2012 Elsevier Interactive Patient Education [employer].

## 2015-05-05 NOTE — Progress Notes (Signed)
Subjective:    Patient ID: Sylvia Taylor, female    DOB: Jul 07, 1970, 45 y.o.   MRN: [id number]  Patient here today for follow up of chronic medical problems.  Outpatient Encounter Prescriptions as of 05/05/2015  Medication Sig  . cetirizine (ZYRTEC) 10 MG tablet Take 1 tablet (10 mg total) by mouth daily.  . fluticasone (FLONASE) 50 MCG/ACT nasal spray Place 2 sprays into both nostrils daily.  Marland Kitchen levothyroxine (SYNTHROID, LEVOTHROID) 50 MCG tablet TAKE 1 TABLET (50 MCG TOTAL) BY MOUTH DAILY.  . naproxen (NAPROSYN) 500 MG tablet Take 1 tablet (500 mg total) by mouth 2 (two) times daily with a meal.  . [DISCONTINUED] azithromycin (ZITHROMAX Z-PAK) 250 MG tablet As directed  . [DISCONTINUED] levothyroxine (SYNTHROID, LEVOTHROID) 50 MCG tablet TAKE 1 TABLET (50 MCG TOTAL) BY MOUTH DAILY.   No facility-administered encounter medications on file as of 05/05/2015.      Thyroid Problem Visit type: hypothyroidism. Patient reports no cold intolerance, diaphoresis, diarrhea, heat intolerance, menstrual problem, palpitations, tremors or visual change.  allergic rhinitis Flonase, zytrec and patanase daily- combination is working well. Fluid retention Mainly when she is on her period takes lasix which resolves it.   Review of Systems  Constitutional: Negative for diaphoresis.  Cardiovascular: Negative for palpitations.  Gastrointestinal: Negative for diarrhea.  Endocrine: Negative for cold intolerance and heat intolerance.  Genitourinary: Negative for menstrual problem.  Neurological: Negative for tremors.  All other systems reviewed and are negative.      Objective:   Physical Exam  Constitutional: She is oriented to person, place, and time. She appears well-developed and well-nourished.  HENT:  Nose: Nose normal.  Mouth/Throat: Oropharynx is clear and moist.  Eyes: EOM are normal.  Neck: Trachea normal, normal range of motion and full passive range of motion without pain. Neck supple.  No JVD present. Carotid bruit is not present. No thyromegaly present.  Cardiovascular: Normal rate, regular rhythm, normal heart sounds and intact distal pulses.  Exam reveals no gallop and no friction rub.   No murmur heard. Pulmonary/Chest: Effort normal and breath sounds normal.  Abdominal: Soft. Bowel sounds are normal. She exhibits no distension and no mass. There is no tenderness.  Musculoskeletal: Normal range of motion.  Lymphadenopathy:    She has no cervical adenopathy.  Neurological: She is alert and oriented to person, place, and time. She has normal reflexes.  Skin: Skin is warm and dry.  Psychiatric: She has a normal mood and affect. Her behavior is normal. Judgment and thought content normal.   Ht 5' 4" (1.626 m)  Wt 337 lb (152.862 kg)  BMI 57.82 kg/m2  LMP 04/21/2015 (Approximate)        Assessment & Plan:   1. Hypothyroidism due to acquired atrophy of thyroid - CMP14+EGFR - Thyroid Panel With TSH - Lipid panel - levothyroxine (SYNTHROID, LEVOTHROID) 50 MCG tablet; TAKE 1 TABLET (50 MCG TOTAL) BY MOUTH DAILY.  Dispense: 90 tablet; Refill: 1  2. Other seasonal allergic rhinitis - cetirizine (ZYRTEC) 10 MG tablet; Take 1 tablet (10 mg total) by mouth daily.  Dispense: 90 tablet; Refill: 1 - fluticasone (FLONASE) 50 MCG/ACT nasal spray; Place 2 sprays into both nostrils daily.  Dispense: 16 g; Refill: 5  3. Bilateral edema of lower extremity Elevate legs when sitting  4. Morbid obesity, unspecified obesity type ([place]) Discussed diet and exercise for person with BMI >25 Will recheck weight in 3-6 months     Labs pending Health maintenance reviewed Diet and exercise  encouraged Continue all meds Follow up  In 6 month   [place], FNP

## 2015-05-06 LAB — LIPID PANEL
Chol/HDL Ratio: 3.2 ratio units (ref 0.0–4.4)
Cholesterol, Total: 164 mg/dL (ref 100–199)
HDL: 51 mg/dL (ref >39)
LDL Calculated: 94 mg/dL (ref 0–99)
Triglycerides: 97 mg/dL (ref 0–149)
VLDL Cholesterol Cal: 19 mg/dL (ref 5–40)

## 2015-05-06 LAB — CMP14+EGFR
ALT: 26 IU/L (ref 0–32)
AST: 13 IU/L (ref 0–40)
Albumin/Globulin Ratio: 1.5 (ref 1.2–2.2)
Albumin: 4.1 g/dL (ref 3.5–5.5)
Alkaline Phosphatase: 86 IU/L (ref 39–117)
BUN/Creatinine Ratio: 25 — ABNORMAL HIGH (ref 9–23)
BUN: 15 mg/dL (ref 6–24)
Bilirubin Total: 0.3 mg/dL (ref 0.0–1.2)
CO2: 25 mmol/L (ref 18–29)
Calcium: 9 mg/dL (ref 8.7–10.2)
Chloride: 101 mmol/L (ref 96–106)
Creatinine, Ser: 0.61 mg/dL (ref 0.57–1.00)
GFR calc Af Amer: 129 mL/min/{1.73_m2} (ref >59)
GFR calc non Af Amer: 112 mL/min/{1.73_m2} (ref >59)
Globulin, Total: 2.8 g/dL (ref 1.5–4.5)
Glucose: 93 mg/dL (ref 65–99)
Potassium: 4.4 mmol/L (ref 3.5–5.2)
Sodium: 141 mmol/L (ref 134–144)
Total Protein: 6.9 g/dL (ref 6.0–8.5)

## 2015-05-06 LAB — THYROID PANEL WITH TSH
Free Thyroxine Index: 2.8 (ref 1.2–4.9)
T3 Uptake Ratio: 27 % (ref 24–39)
T4, Total: 10.5 ug/dL (ref 4.5–12.0)
TSH: 0.515 u[IU]/mL (ref 0.450–4.500)

## 2015-05-09 ENCOUNTER — Other Ambulatory Visit: Payer: Self-pay

## 2015-05-09 DIAGNOSIS — Z1231 Encounter for screening mammogram for malignant neoplasm of breast: Secondary | ICD-10-CM

## 2015-06-26 ENCOUNTER — Ambulatory Visit
Admission: RE | Admit: 2015-06-26 | Discharge: 2015-06-26 | Disposition: A | Discharge status: Home / Self Care | Payer: BC Managed Care – PPO | Source: Ambulatory Visit

## 2015-06-26 DIAGNOSIS — Z1231 Encounter for screening mammogram for malignant neoplasm of breast: Secondary | ICD-10-CM

## 2015-07-26 ENCOUNTER — Other Ambulatory Visit: Payer: Self-pay | Admitting: Obstetrics and Gynecology

## 2015-07-27 LAB — CYTOLOGY - PAP

## 2015-10-14 ENCOUNTER — Telehealth: Payer: BC Managed Care – PPO | Admitting: Adult Health

## 2015-10-14 DIAGNOSIS — J0110 Acute frontal sinusitis, unspecified: Secondary | ICD-10-CM

## 2015-10-14 MED ORDER — AMOXICILLIN-POT CLAVULANATE 875-125 MG PO TABS: 1.0000 | ORAL_TABLET | Freq: Two times a day (BID) | ORAL | 0 refills | Status: DC

## 2015-10-14 NOTE — Progress Notes (Signed)

## 2015-11-07 ENCOUNTER — Encounter: Payer: Self-pay | Admitting: Nurse Practitioner

## 2015-11-07 ENCOUNTER — Ambulatory Visit (INDEPENDENT_AMBULATORY_CARE_PROVIDER_SITE_OTHER): Payer: BC Managed Care – PPO | Admitting: Nurse Practitioner

## 2015-11-07 VITALS — BP 133/92 | HR 85 | Temp 97.5°F | Ht 64.0 in | Wt 356.6 lb

## 2015-11-07 DIAGNOSIS — E034 Atrophy of thyroid (acquired): Secondary | ICD-10-CM

## 2015-11-07 DIAGNOSIS — E038 Other specified hypothyroidism: Secondary | ICD-10-CM | POA: Diagnosis not present

## 2015-11-07 DIAGNOSIS — J302 Other seasonal allergic rhinitis: Secondary | ICD-10-CM

## 2015-11-07 DIAGNOSIS — R600 Localized edema: Secondary | ICD-10-CM

## 2015-11-07 MED ORDER — FLUTICASONE PROPIONATE 50 MCG/ACT NA SUSP: 2.0000 | Freq: Every day | NASAL | 5 refills | Status: DC

## 2015-11-07 MED ORDER — LEVOTHYROXINE SODIUM 50 MCG PO TABS: ORAL_TABLET | ORAL | 1 refills | Status: DC

## 2015-11-07 NOTE — Progress Notes (Signed)
  Subjective:    Patient ID: Sylvia Taylor, female    DOB: October 04, 1970, 45 y.o.   MRN: [id number]  Patient here today for follow up of chronic medical problems. No change since last visit. No complaints today.  Outpatient Encounter Prescriptions as of 11/07/2015  Medication Sig  . cetirizine (ZYRTEC) 10 MG tablet Take 1 tablet (10 mg total) by mouth daily.  . fluticasone (FLONASE) 50 MCG/ACT nasal spray Place 2 sprays into both nostrils daily.  Marland Kitchen levothyroxine (SYNTHROID, LEVOTHROID) 50 MCG tablet TAKE 1 TABLET (50 MCG TOTAL) BY MOUTH DAILY.  . naproxen (NAPROSYN) 500 MG tablet Take 1 tablet (500 mg total) by mouth 2 (two) times daily with a meal.  Thyroid Problem  Visit type: hypothyroidism. Patient reports no cold intolerance, diaphoresis, diarrhea, heat intolerance, menstrual problem, palpitations, tremors or visual change.  allergic rhinitis Flonase, zytrec and patanase daily- combination is working well. Fluid retention Mainly when she is on her period takes lasix which resolves it.   Review of Systems  Constitutional: Negative for diaphoresis.  Cardiovascular: Negative for palpitations.  Gastrointestinal: Negative for diarrhea.  Endocrine: Negative for cold intolerance and heat intolerance.  Genitourinary: Negative for menstrual problem.  Neurological: Negative for tremors.  All other systems reviewed and are negative.      Objective:   Physical Exam  Constitutional: She is oriented to person, place, and time. She appears well-developed and well-nourished.  HENT:  Nose: Nose normal.  Mouth/Throat: Oropharynx is clear and moist.  Eyes: EOM are normal.  Neck: Trachea normal, normal range of motion and full passive range of motion without pain. Neck supple. No JVD present. Carotid bruit is not present. No thyromegaly present.  Cardiovascular: Normal rate, regular rhythm, normal heart sounds and intact distal pulses.  Exam reveals no gallop and no friction rub.   No murmur  heard. Pulmonary/Chest: Effort normal and breath sounds normal.  Abdominal: Soft. Bowel sounds are normal. She exhibits no distension and no mass. There is no tenderness.  Musculoskeletal: Normal range of motion.  Lymphadenopathy:    She has no cervical adenopathy.  Neurological: She is alert and oriented to person, place, and time. She has normal reflexes.  Skin: Skin is warm and dry.  Psychiatric: She has a normal mood and affect. Her behavior is normal. Judgment and thought content normal.   BP (!) 133/92   Pulse 85   Temp 97.5 F (36.4 C) (Oral)   Ht '5\' 4"'$  (1.626 m)   Wt (!) 356 lb 9.6 oz (161.8 kg)   LMP 09/13/2015   BMI 61.21 kg/m         Assessment & Plan:  1. Hypothyroidism due to acquired atrophy of thyroid - levothyroxine (SYNTHROID, LEVOTHROID) 50 MCG tablet; TAKE 1 TABLET (50 MCG TOTAL) BY MOUTH DAILY.  Dispense: 90 tablet; Refill: 1  2. Bilateral edema of lower extremity Elevate legs when sitting  3. Morbid obesity, unspecified obesity type ([place]) Discussed diet and exercise for person with BMI >25 Will recheck weight in 3-6 months  4. Other seasonal allergic rhinitis - fluticasone (FLONASE) 50 MCG/ACT nasal spray; Place 2 sprays into both nostrils daily.  Dispense: 16 g; Refill: 5  Orders Placed This Encounter  Procedures  . CMP14+EGFR  . Lipid panel  . Thyroid Panel With TSH     Labs pending Health maintenance reviewed Diet and exercise encouraged Continue all meds Follow up  In 6 months   [place], FNP

## 2015-11-08 LAB — CMP14+EGFR
ALT: 26 IU/L (ref 0–32)
AST: 20 IU/L (ref 0–40)
Albumin/Globulin Ratio: 1.4 (ref 1.2–2.2)
Albumin: 4.3 g/dL (ref 3.5–5.5)
Alkaline Phosphatase: 89 IU/L (ref 39–117)
BUN/Creatinine Ratio: 24 — ABNORMAL HIGH (ref 9–23)
BUN: 13 mg/dL (ref 6–24)
Bilirubin Total: 0.4 mg/dL (ref 0.0–1.2)
CO2: 25 mmol/L (ref 18–29)
Calcium: 9.2 mg/dL (ref 8.7–10.2)
Chloride: 100 mmol/L (ref 96–106)
Creatinine, Ser: 0.54 mg/dL — ABNORMAL LOW (ref 0.57–1.00)
GFR calc Af Amer: 134 mL/min/{1.73_m2} (ref >59)
GFR calc non Af Amer: 116 mL/min/{1.73_m2} (ref >59)
Globulin, Total: 3 g/dL (ref 1.5–4.5)
Glucose: 82 mg/dL (ref 65–99)
Potassium: 4.3 mmol/L (ref 3.5–5.2)
Sodium: 139 mmol/L (ref 134–144)
Total Protein: 7.3 g/dL (ref 6.0–8.5)

## 2015-11-08 LAB — LIPID PANEL
Chol/HDL Ratio: 3.9 ratio units (ref 0.0–4.4)
Cholesterol, Total: 171 mg/dL (ref 100–199)
HDL: 44 mg/dL (ref >39)
LDL Calculated: 106 mg/dL — ABNORMAL HIGH (ref 0–99)
Triglycerides: 104 mg/dL (ref 0–149)
VLDL Cholesterol Cal: 21 mg/dL (ref 5–40)

## 2015-11-08 LAB — THYROID PANEL WITH TSH
Free Thyroxine Index: 2.4 (ref 1.2–4.9)
T3 Uptake Ratio: 27 % (ref 24–39)
T4, Total: 8.8 ug/dL (ref 4.5–12.0)
TSH: 3.35 u[IU]/mL (ref 0.450–4.500)

## 2016-01-09 ENCOUNTER — Encounter: Payer: Self-pay | Admitting: Nurse Practitioner

## 2016-01-09 ENCOUNTER — Ambulatory Visit (INDEPENDENT_AMBULATORY_CARE_PROVIDER_SITE_OTHER): Payer: BC Managed Care – PPO | Admitting: Nurse Practitioner

## 2016-01-09 VITALS — BP 136/82 | HR 80 | Temp 97.1°F | Ht 64.0 in | Wt 359.0 lb

## 2016-01-09 DIAGNOSIS — Z09 Encounter for follow-up examination after completed treatment for conditions other than malignant neoplasm: Secondary | ICD-10-CM | POA: Diagnosis not present

## 2016-01-09 MED ORDER — NAPROXEN 500 MG PO TABS: 500.0000 mg | ORAL_TABLET | Freq: Two times a day (BID) | ORAL | 1 refills | Status: DC

## 2016-01-09 NOTE — Progress Notes (Signed)
   Subjective:    Patient ID: Sylvia Taylor, female    DOB: [birth year], 45 y.o.   MRN: [id number]  HPI Patient in today for ER follow up. Patient went to ER Saturday morning at 2am. [facility] was having left shoulder pain and when she layed down she got a heavy feeling in her chest. SHe became very anxious and woke her husband up to take her to ER. SHe went to [place]. All enzymes were negative and there was no change in her EKG. They sent her home on no new meds.   Review of Systems  Constitutional: Negative.   HENT: Negative.   Respiratory: Negative.   Cardiovascular: Negative.   Gastrointestinal: Negative.   Genitourinary: Negative.   Musculoskeletal: Positive for neck pain.       Shoulder puling sensation  Neurological: Negative.   Psychiatric/Behavioral: Negative.   All other systems reviewed and are negative.      Objective:   Physical Exam  Constitutional: She is oriented to person, place, and time. She appears well-developed and well-nourished. No distress.  Cardiovascular: Normal rate, regular rhythm and normal heart sounds.   Pulmonary/Chest: Effort normal and breath sounds normal.  Musculoskeletal:  FROM of left shoulder without pain FROM of cervical spine with pulling sensation down around left shoulder blade with rotation to right  Neurological: She is alert and oriented to person, place, and time.  Skin: Skin is warm.  Psychiatric: She has a normal mood and affect. Her behavior is normal. Judgment and thought content normal.    BP 136/82 (BP Location: Left Arm, Cuff Size: Large)   Pulse 80   Temp 97.1 F (36.2 C) (Oral)   Ht 5\' 4"  (1.626 m)   Wt (!) 359 lb (162.8 kg)   BMI 61.62 kg/m         Assessment & Plan:  1. Hospital discharge follow-up Meds ordered this encounter  Medications  . naproxen (NAPROSYN) 500 MG tablet    Sig: Take 1 tablet (500 mg total) by mouth 2 (two) times daily with a meal.    Dispense:  60 tablet    Refill:  1    Order Specific  Question:   Supervising Provider    Answer:   VINCENT, CAROL L [[id number]]   Moist heat  Rest RTO prn  Mary-Margaret Hassell Done, FNP

## 2016-02-01 ENCOUNTER — Other Ambulatory Visit: Payer: Self-pay | Admitting: Nurse Practitioner

## 2016-02-01 DIAGNOSIS — E034 Atrophy of thyroid (acquired): Secondary | ICD-10-CM

## 2016-05-07 ENCOUNTER — Ambulatory Visit: Payer: BC Managed Care – PPO | Admitting: Nurse Practitioner

## 2016-05-13 ENCOUNTER — Other Ambulatory Visit: Payer: Self-pay | Admitting: Nurse Practitioner

## 2016-05-13 DIAGNOSIS — Z1231 Encounter for screening mammogram for malignant neoplasm of breast: Secondary | ICD-10-CM

## 2016-05-28 ENCOUNTER — Telehealth: Payer: BC Managed Care – PPO | Admitting: Family

## 2016-05-28 DIAGNOSIS — B9689 Other specified bacterial agents as the cause of diseases classified elsewhere: Secondary | ICD-10-CM

## 2016-05-28 DIAGNOSIS — J019 Acute sinusitis, unspecified: Secondary | ICD-10-CM

## 2016-05-28 MED ORDER — FLUCONAZOLE 150 MG PO TABS: 150.0000 mg | ORAL_TABLET | Freq: Once | ORAL | 0 refills | Status: AC

## 2016-05-28 MED ORDER — AZITHROMYCIN 250 MG PO TABS: ORAL_TABLET | ORAL | 0 refills | Status: DC

## 2016-05-28 NOTE — Progress Notes (Signed)
We are sorry that you are not feeling well.  Here is how we plan to help!  Based on what you have shared with me it looks like you have sinusitis.  Sinusitis is inflammation and infection in the sinus cavities of the head.  Based on your presentation I believe you most likely have Acute Bacterial Sinusitis.  This is an infection caused by bacteria and is treated with antibiotics. I have prescribed zpak as directed. You may use an oral decongestant such as Mucinex D or if you have glaucoma or high blood pressure use plain Mucinex. Saline nasal spray help and can safely be used as often as needed for congestion.  If you develop worsening sinus pain, fever or notice severe headache and vision changes, or if symptoms are not better after completion of antibiotic, please schedule an appointment with a health care provider.    I have also sent in Diflucan  Sinus infections are not as easily transmitted as other respiratory infection, however we still recommend that you avoid close contact with loved ones, especially the very young and elderly.  Remember to wash your hands thoroughly throughout the day as this is the number one way to prevent the spread of infection!  Home Care:  Only take medications as instructed by your medical team.  Complete the entire course of an antibiotic.  Do not take these medications with alcohol.  A steam or ultrasonic humidifier can help congestion.  You can place a towel over your head and breathe in the steam from hot water coming from a faucet.  Avoid close contacts especially the very young and the elderly.  Cover your mouth when you cough or sneeze.  Always remember to wash your hands.  Get Help Right Away If:  You develop worsening fever or sinus pain.  You develop a severe head ache or visual changes.  Your symptoms persist after you have completed your treatment plan.  Make sure you  Understand these instructions.  Will watch your condition.  Will  get help right away if you are not doing well or get worse.  Your e-visit answers were reviewed by a board certified advanced clinical practitioner to complete your personal care plan.  Depending on the condition, your plan could have included both over the counter or prescription medications.  If there is a problem please reply  once you have received a response from your provider.  Your safety is important to [place].  If you have drug allergies check your prescription carefully.    You can use MyChart to ask questions about today's visit, request a non-urgent call back, or ask for a work or school excuse for 24 hours related to this e-Visit. If it has been greater than 24 hours you will need to follow up with your provider, or enter a new e-Visit to address those concerns.  You will get an e-mail in the next two days asking about your experience.  I hope that your e-visit has been valuable and will speed your recovery. Thank you for using e-visits.

## 2016-06-27 ENCOUNTER — Ambulatory Visit
Admission: RE | Admit: 2016-06-27 | Discharge: 2016-06-27 | Disposition: A | Discharge status: Home / Self Care | Payer: BC Managed Care – PPO | Source: Ambulatory Visit | Attending: Nurse Practitioner | Admitting: Nurse Practitioner

## 2016-06-27 DIAGNOSIS — Z1231 Encounter for screening mammogram for malignant neoplasm of breast: Secondary | ICD-10-CM

## 2016-07-19 ENCOUNTER — Other Ambulatory Visit (INDEPENDENT_AMBULATORY_CARE_PROVIDER_SITE_OTHER): Payer: Self-pay | Admitting: Otolaryngology

## 2016-07-19 DIAGNOSIS — J329 Chronic sinusitis, unspecified: Secondary | ICD-10-CM

## 2016-07-29 ENCOUNTER — Other Ambulatory Visit: Payer: BC Managed Care – PPO

## 2016-08-08 ENCOUNTER — Ambulatory Visit (INDEPENDENT_AMBULATORY_CARE_PROVIDER_SITE_OTHER): Payer: BC Managed Care – PPO | Admitting: Nurse Practitioner

## 2016-08-08 ENCOUNTER — Encounter: Payer: Self-pay | Admitting: Nurse Practitioner

## 2016-08-08 VITALS — BP 126/81 | HR 88 | Temp 98.2°F | Ht 64.8 in | Wt 372.4 lb

## 2016-08-08 DIAGNOSIS — R600 Localized edema: Secondary | ICD-10-CM | POA: Diagnosis not present

## 2016-08-08 DIAGNOSIS — E034 Atrophy of thyroid (acquired): Secondary | ICD-10-CM | POA: Diagnosis not present

## 2016-08-08 DIAGNOSIS — J309 Allergic rhinitis, unspecified: Secondary | ICD-10-CM | POA: Diagnosis not present

## 2016-08-08 DIAGNOSIS — G43109 Migraine with aura, not intractable, without status migrainosus: Secondary | ICD-10-CM

## 2016-08-08 MED ORDER — TOPIRAMATE 50 MG PO TABS: 50.0000 mg | ORAL_TABLET | Freq: Two times a day (BID) | ORAL | 3 refills | Status: DC

## 2016-08-08 MED ORDER — LEVOTHYROXINE SODIUM 50 MCG PO TABS: ORAL_TABLET | ORAL | 1 refills | Status: DC

## 2016-08-08 NOTE — Patient Instructions (Signed)

## 2016-08-08 NOTE — Progress Notes (Signed)
Subjective:    Patient ID: Sylvia Taylor, female    DOB: [birth year], 46 y.o.   MRN: [id number]  HPI DONIQUE HAMMONDS is here today for follow up of chronic medical problem.  Outpatient Encounter Prescriptions as of 08/08/2016  Medication Sig  . cetirizine (ZYRTEC) 10 MG tablet Take 1 tablet (10 mg total) by mouth daily.  . fluticasone (FLONASE) 50 MCG/ACT nasal spray Place 2 sprays into both nostrils daily.  Marland Kitchen levothyroxine (SYNTHROID, LEVOTHROID) 50 MCG tablet TAKE 1 TABLET (50 MCG TOTAL) BY MOUTH DAILY.  . naproxen (NAPROSYN) 500 MG tablet Take 1 tablet (500 mg total) by mouth 2 (two) times daily with a meal.  . [DISCONTINUED] azithromycin (ZITHROMAX) 250 MG tablet 2 tabs today, then 1 tab daily x 4 more days.  . [DISCONTINUED] levothyroxine (SYNTHROID, LEVOTHROID) 50 MCG tablet TAKE 1 TABLET (50 MCG TOTAL) BY MOUTH DAILY.   No facility-administered encounter medications on file as of 08/08/2016.     1. Hypothyroidism due to acquired atrophy of thyroid  Patient taking levothyroxine for management.  2. Fluid retention in legs  Patient elevates legs when sitting.  3. Severe obesity (BMI >= 40) (HCC) Patient has gained 13 pounds since previous visit in last 6 months.   4. Allergic rhinitis, unspecified seasonality, unspecified trigger  Patient takes cetirizine and states this keeps her symptoms under control.    New complaints: Patient states she has been having headaches for about 6 months that last 2-3 days and are occurring about every other week.  Patient states the headaches are unilateral and occasionally have caused her to vomit.  Patient has tried Excedrin migraine, tylenol extra strength, and naprosyn.  Patient states symptoms improved with laying in a darkened room or a short nap.  Review of Systems  Constitutional: Negative for activity change, appetite change and fatigue.  HENT: Negative.   Respiratory: Negative for cough and shortness of breath.   Cardiovascular:  Negative for chest pain and palpitations.  Gastrointestinal: Negative for abdominal distention and abdominal pain.  Neurological: Positive for headaches (x6 months at least every other week, lasting 2.5-3 days.  Has vomited from pain.). Negative for dizziness.  All other systems reviewed and are negative.      Objective:   Physical Exam  Constitutional: She is oriented to person, place, and time. She appears well-developed and well-nourished. No distress.  HENT:  Head: Normocephalic.  Right Ear: External ear normal.  Left Ear: External ear normal.  Mouth/Throat: Oropharynx is clear and moist.  Eyes: Pupils are equal, round, and reactive to light.  Neck: Normal range of motion. Neck supple. No JVD present. No thyromegaly present.  Cardiovascular: Normal rate, regular rhythm, normal heart sounds and intact distal pulses.   No murmur heard. Pulmonary/Chest: Effort normal and breath sounds normal. No respiratory distress.  Abdominal: Soft. Bowel sounds are normal. She exhibits no distension. There is no tenderness.  Musculoskeletal: Normal range of motion. She exhibits no edema.  Lymphadenopathy:    She has no cervical adenopathy.  Neurological: She is alert and oriented to person, place, and time.  Skin: Skin is warm and dry.  Psychiatric: She has a normal mood and affect. Her behavior is normal. Judgment and thought content normal.   BP 126/81   Pulse 88   Temp 98.2 F (36.8 C)   Ht 5' 4.8" (1.646 m)   Wt (!) 372 lb 6.4 oz (168.9 kg)   BMI 62.35 kg/m      Assessment & Plan:  1. Hypothyroidism due to acquired atrophy of thyroid - CMP14+EGFR - Lipid panel - Thyroid Panel With TSH - levothyroxine (SYNTHROID, LEVOTHROID) 50 MCG tablet; TAKE 1 TABLET (50 MCG TOTAL) BY MOUTH DAILY.  Dispense: 90 tablet; Refill: 1  2. Fluid retention in legs Elevate when sitting  3. Severe obesity (BMI >= 40) (HCC) Discussed diet and exercise for person with BMI >25 Will recheck weight in 3-6  months  4. Allergic rhinitis, unspecified seasonality, unspecified trigger  5. Migraine with aura and without status migrainosus, not intractable Going to try topamax for migraine prevention- hoping it will help with weight loss as well Side effects discussed Keep diary of migraines and broing to next appointment Avoid caffeine - topiramate (TOPAMAX) 50 MG tablet; Take 1 tablet (50 mg total) by mouth 2 (two) times daily.  Dispense: 60 tablet; Refill: 3    Labs pending Health maintenance reviewed Diet and exercise encouraged Continue all meds Follow up  In 3 months   [place], FNP

## 2016-08-09 LAB — CMP14+EGFR
ALT: 36 IU/L — ABNORMAL HIGH (ref 0–32)
AST: 22 IU/L (ref 0–40)
Albumin/Globulin Ratio: 1.6 (ref 1.2–2.2)
Albumin: 4.4 g/dL (ref 3.5–5.5)
Alkaline Phosphatase: 95 IU/L (ref 39–117)
BUN/Creatinine Ratio: 15 (ref 9–23)
BUN: 11 mg/dL (ref 6–24)
Bilirubin Total: 0.3 mg/dL (ref 0.0–1.2)
CO2: 25 mmol/L (ref 20–29)
Calcium: 9.4 mg/dL (ref 8.7–10.2)
Chloride: 101 mmol/L (ref 96–106)
Creatinine, Ser: 0.73 mg/dL (ref 0.57–1.00)
GFR calc Af Amer: 117 mL/min/{1.73_m2} (ref >59)
GFR calc non Af Amer: 101 mL/min/{1.73_m2} (ref >59)
Globulin, Total: 2.8 g/dL (ref 1.5–4.5)
Glucose: 82 mg/dL (ref 65–99)
Potassium: 4.5 mmol/L (ref 3.5–5.2)
Sodium: 138 mmol/L (ref 134–144)
Total Protein: 7.2 g/dL (ref 6.0–8.5)

## 2016-08-09 LAB — LIPID PANEL
Chol/HDL Ratio: 3.9 ratio (ref 0.0–4.4)
Cholesterol, Total: 164 mg/dL (ref 100–199)
HDL: 42 mg/dL (ref >39)
LDL Calculated: 95 mg/dL (ref 0–99)
Triglycerides: 133 mg/dL (ref 0–149)
VLDL Cholesterol Cal: 27 mg/dL (ref 5–40)

## 2016-08-09 LAB — THYROID PANEL WITH TSH
Free Thyroxine Index: 2.4 (ref 1.2–4.9)
T3 Uptake Ratio: 27 % (ref 24–39)
T4, Total: 8.9 ug/dL (ref 4.5–12.0)
TSH: 2.34 u[IU]/mL (ref 0.450–4.500)

## 2016-08-20 ENCOUNTER — Other Ambulatory Visit: Payer: BC Managed Care – PPO

## 2016-09-19 ENCOUNTER — Encounter: Payer: Self-pay | Admitting: *Deleted

## 2016-10-27 ENCOUNTER — Other Ambulatory Visit: Payer: Self-pay | Admitting: Nurse Practitioner

## 2016-10-27 DIAGNOSIS — E034 Atrophy of thyroid (acquired): Secondary | ICD-10-CM

## 2016-11-05 ENCOUNTER — Ambulatory Visit: Payer: BC Managed Care – PPO | Admitting: Nurse Practitioner

## 2016-11-05 IMAGING — CR DG ABDOMEN 1V
2 series · 2 of 2 positions shown · non-contrast
Comparison: None.

CLINICAL DATA: Leg pain.

EXAM:
ABDOMEN - 1 VIEW

[view not recorded (1 of 2)]
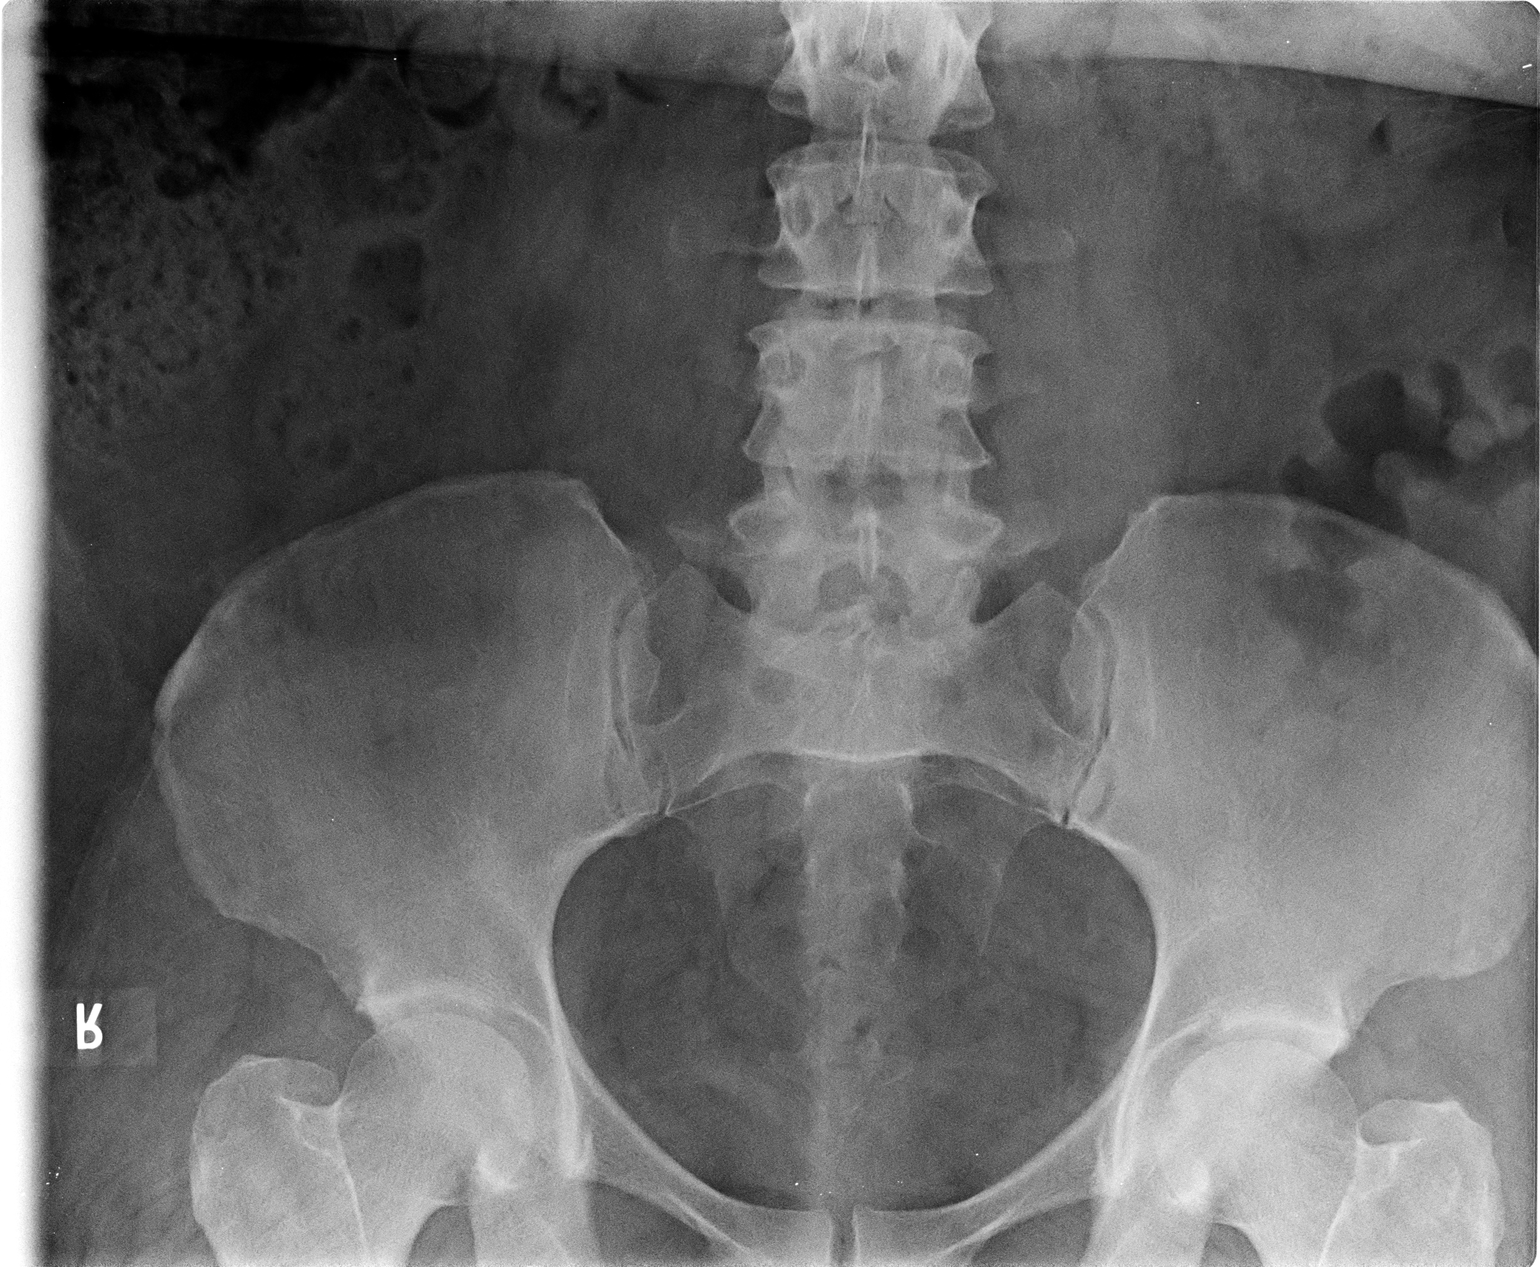

[view not recorded (2 of 2)]
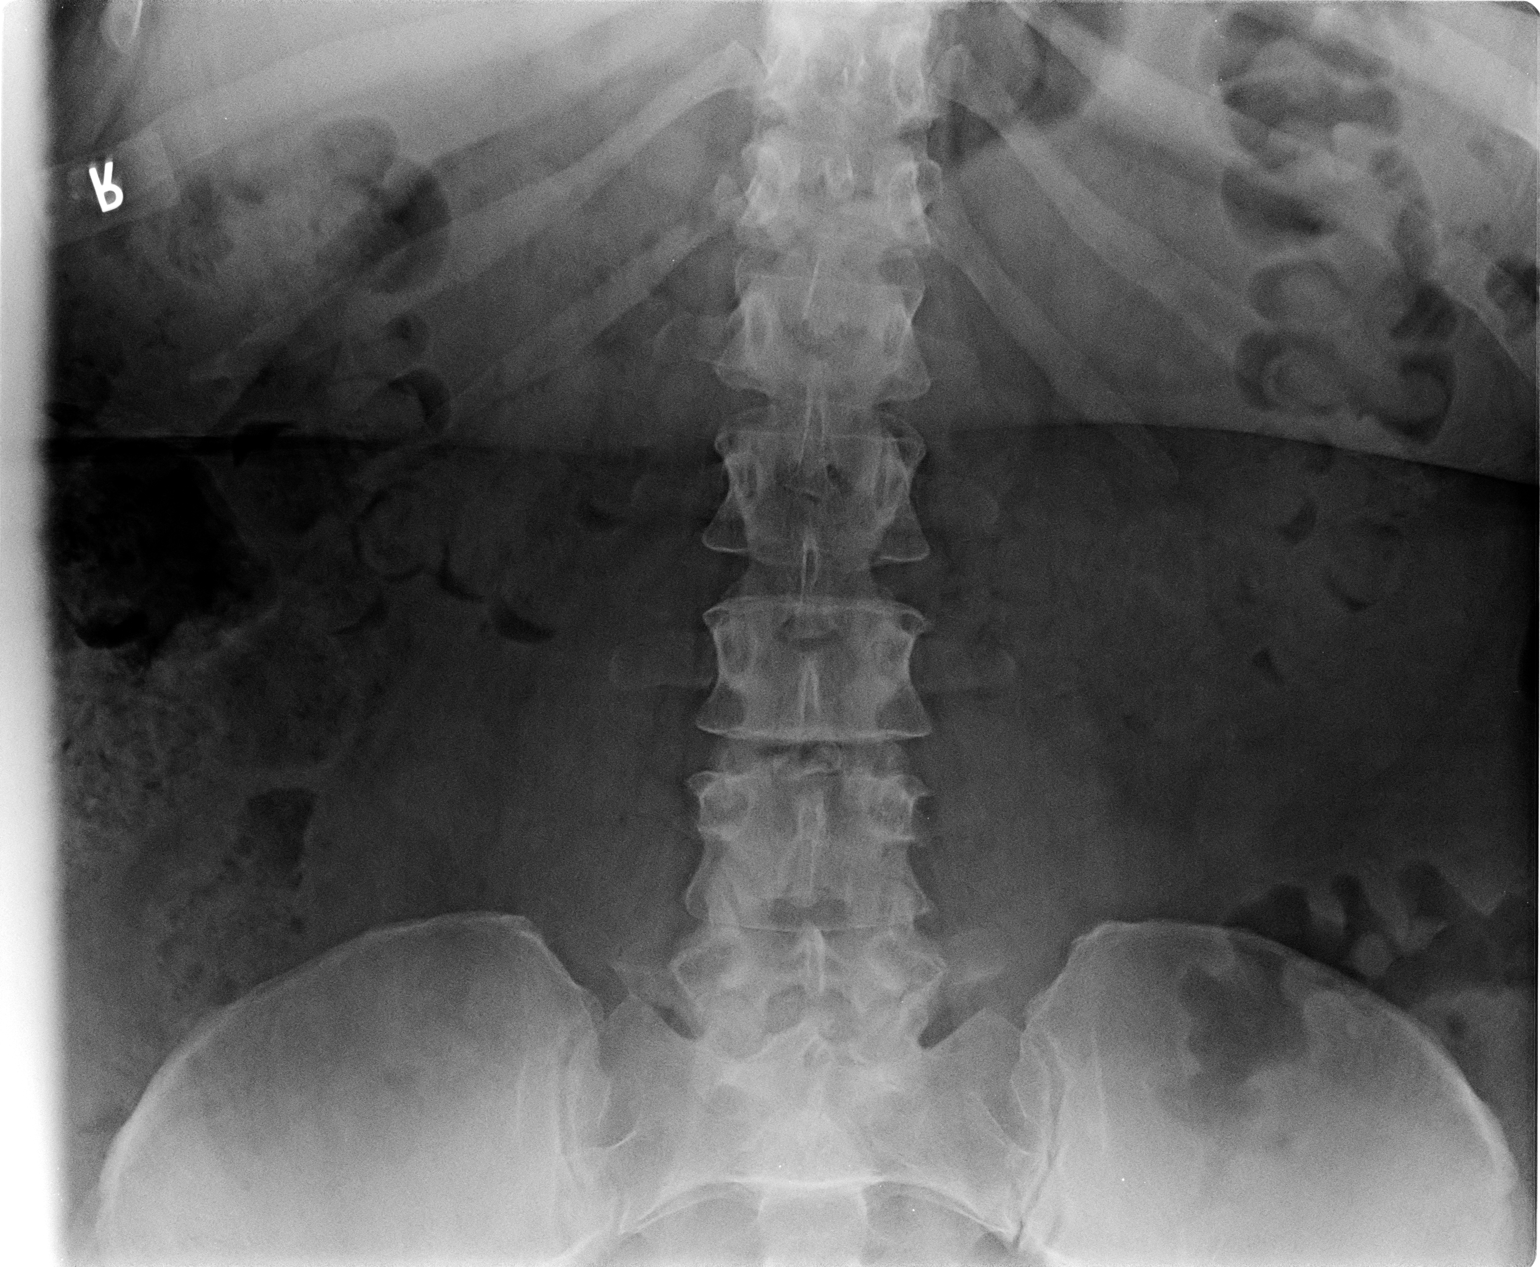

[2 of 2 positions shown; findings below may reference images not displayed]

FINDINGS: Soft tissue structures are unremarkable. Stool noted throughout the
colon. No abnormal calcifications. No acute bony abnormality
identified.
IMPRESSION: No acute abnormality. Prominent amount of stool noted throughout the
colon. Constipation cannot be excluded.

## 2016-11-08 ENCOUNTER — Ambulatory Visit: Payer: BC Managed Care – PPO | Admitting: Nurse Practitioner

## 2017-02-13 ENCOUNTER — Telehealth: Payer: BC Managed Care – PPO | Admitting: Family

## 2017-02-13 DIAGNOSIS — J028 Acute pharyngitis due to other specified organisms: Secondary | ICD-10-CM

## 2017-02-13 DIAGNOSIS — B9689 Other specified bacterial agents as the cause of diseases classified elsewhere: Secondary | ICD-10-CM

## 2017-02-13 MED ORDER — AZITHROMYCIN 250 MG PO TABS: ORAL_TABLET | ORAL | 0 refills | Status: DC

## 2017-02-13 MED ORDER — PREDNISONE 5 MG PO TABS: 5.0000 mg | ORAL_TABLET | ORAL | 0 refills | Status: DC

## 2017-02-13 MED ORDER — BENZONATATE 100 MG PO CAPS
100.0000 mg | ORAL_CAPSULE | Freq: Three times a day (TID) | ORAL | 0 refills | Status: DC | PRN
Start: 2017-02-13 — End: 2017-05-06

## 2017-02-13 NOTE — Progress Notes (Signed)

## 2017-02-28 ENCOUNTER — Encounter: Payer: Self-pay | Admitting: Nurse Practitioner

## 2017-02-28 ENCOUNTER — Ambulatory Visit: Payer: BC Managed Care – PPO | Admitting: Nurse Practitioner

## 2017-02-28 VITALS — BP 125/83 | HR 100 | Temp 98.8°F | Ht 64.0 in | Wt 373.0 lb

## 2017-02-28 DIAGNOSIS — J40 Bronchitis, not specified as acute or chronic: Secondary | ICD-10-CM

## 2017-02-28 NOTE — Progress Notes (Signed)
   Subjective:    Patient ID: Sylvia Taylor, female    DOB: 11-13-1970, 47 y.o.   MRN: [id number]  HPI Patient comes in today c/o cough , wheezing and sob. Started over 2 weeks ago- did evisit the first of month and was given z pak, prednisone and tessalon perles. She felt some better but now she says chest feels tight and wheezing has worsened.    Review of Systems  Constitutional: Negative for chills and fever.  HENT: Positive for congestion. Negative for ear pain, rhinorrhea, sore throat and trouble swallowing.   Respiratory: Positive for cough, shortness of breath (trouble taking a deep breath) and wheezing.   Cardiovascular: Negative.   Gastrointestinal: Negative.   Genitourinary: Negative.   Neurological: Negative.   Psychiatric/Behavioral: Negative.   All other systems reviewed and are negative.      Objective:   Physical Exam  Constitutional: She appears well-developed and well-nourished. No distress.  HENT:  Right Ear: External ear normal.  Left Ear: External ear normal.  Nose: Nose normal.  Mouth/Throat: Oropharynx is clear and moist.  Eyes: Pupils are equal, round, and reactive to light.  Neck: Normal range of motion. Neck supple.  Cardiovascular: Normal rate and regular rhythm.  Pulmonary/Chest: Effort normal and breath sounds normal. No respiratory distress. She has no wheezes. She has no rales.  Lymphadenopathy:    She has no cervical adenopathy.   BP 125/83   Pulse 100   Temp 98.8 F (37.1 C) (Oral)   Ht 5\' 4"  (1.626 m)   Wt (!) 373 lb (169.2 kg)   SpO2 100%   BMI 64.03 kg/m       Assessment & Plan:  1. Bronchitis symbicort 80/160 2 puff BID sample Continue tessalon perles as needed. May take nyquil at night Run humidifier RTO prn  Mary-Margaret Hassell Done, FNP

## 2017-02-28 NOTE — Patient Instructions (Signed)

## 2017-03-04 ENCOUNTER — Other Ambulatory Visit: Payer: Self-pay | Admitting: Nurse Practitioner

## 2017-03-04 ENCOUNTER — Telehealth: Payer: Self-pay | Admitting: Nurse Practitioner

## 2017-03-04 MED ORDER — AZITHROMYCIN 250 MG PO TABS: ORAL_TABLET | ORAL | 0 refills | Status: DC

## 2017-03-04 NOTE — Telephone Encounter (Signed)
Please advise 

## 2017-03-04 NOTE — Progress Notes (Signed)
Patient notified that rx sent to pharmacy 

## 2017-03-04 NOTE — Progress Notes (Signed)
z pak sent to prevent pneumonia

## 2017-03-06 DIAGNOSIS — J9601 Acute respiratory failure with hypoxia: Secondary | ICD-10-CM | POA: Insufficient documentation

## 2017-04-20 DIAGNOSIS — J1000 Influenza due to other identified influenza virus with unspecified type of pneumonia: Secondary | ICD-10-CM | POA: Insufficient documentation

## 2017-04-20 DIAGNOSIS — G4733 Obstructive sleep apnea (adult) (pediatric): Secondary | ICD-10-CM | POA: Insufficient documentation

## 2017-05-06 ENCOUNTER — Encounter: Payer: Self-pay | Admitting: Nurse Practitioner

## 2017-05-06 ENCOUNTER — Ambulatory Visit: Payer: BC Managed Care – PPO | Admitting: Nurse Practitioner

## 2017-05-06 VITALS — BP 121/82 | HR 90 | Temp 97.2°F | Ht 64.0 in | Wt 389.2 lb

## 2017-05-06 DIAGNOSIS — E034 Atrophy of thyroid (acquired): Secondary | ICD-10-CM | POA: Diagnosis not present

## 2017-05-06 DIAGNOSIS — R600 Localized edema: Secondary | ICD-10-CM | POA: Diagnosis not present

## 2017-05-06 NOTE — Patient Instructions (Signed)
Exercising to Lose Weight Exercising can help you to lose weight. In order to lose weight through exercise, you need to do vigorous-intensity exercise. You can tell that you are exercising with vigorous intensity if you are breathing very hard and fast and cannot hold a conversation while exercising. Moderate-intensity exercise helps to maintain your current weight. You can tell that you are exercising at a moderate level if you have a higher heart rate and faster breathing, but you are still able to hold a conversation. How often should I exercise? Choose an activity that you enjoy and set realistic goals. Your health care provider can help you to make an activity plan that works for you. Exercise regularly as directed by your health care provider. This may include:  Doing resistance training twice each week, such as: ? Push-ups. ? Sit-ups. ? Lifting weights. ? Using resistance bands.  Doing a given intensity of exercise for a given amount of time. Choose from these options: ? 150 minutes of moderate-intensity exercise every week. ? 75 minutes of vigorous-intensity exercise every week. ? A mix of moderate-intensity and vigorous-intensity exercise every week.  Children, pregnant women, people who are out of shape, people who are overweight, and older adults may need to consult a health care provider for individual recommendations. If you have any sort of medical condition, be sure to consult your health care provider before starting a new exercise program. What are some activities that can help me to lose weight?  Walking at a rate of at least 4.5 miles an hour.  Jogging or running at a rate of 5 miles per hour.  Biking at a rate of at least 10 miles per hour.  Lap swimming.  Roller-skating or in-line skating.  Cross-country skiing.  Vigorous competitive sports, such as football, basketball, and soccer.  Jumping rope.  Aerobic dancing. How can I be more active in my day-to-day  activities?  Use the stairs instead of the elevator.  Take a walk during your lunch break.  If you drive, park your car farther away from work or school.  If you take public transportation, get off one stop early and walk the rest of the way.  Make all of your phone calls while standing up and walking around.  Get up, stretch, and walk around every 30 minutes throughout the day. What guidelines should I follow while exercising?  Do not exercise so much that you hurt yourself, feel dizzy, or get very short of breath.  Consult your health care provider prior to starting a new exercise program.  Wear comfortable clothes and shoes with good support.  Drink plenty of water while you exercise to prevent dehydration or heat stroke. Body water is lost during exercise and must be replaced.  Work out until you breathe faster and your heart beats faster. This information is not intended to replace advice given to you by your health care provider. Make sure you discuss any questions you have with your health care provider. Document Released: 03/02/2010 Document Revised: 07/06/2015 Document Reviewed: 07/01/2013 Elsevier Interactive Patient Education  2018 [employer]vier Inc.  

## 2017-05-06 NOTE — Progress Notes (Signed)
Subjective:    Patient ID: Sylvia Taylor, female    DOB: Nov 28, 1970, 47 y.o.   MRN: [id number]  HPI  MIAISABELLA BACORN is here today for follow up of chronic medical problem.  Outpatient Encounter Medications as of 05/06/2017  Medication Sig  . benzonatate (TESSALON PERLES) 100 MG capsule Take 1-2 capsules (100-200 mg total) by mouth every 8 (eight) hours as needed for cough.  . cetirizine (ZYRTEC) 10 MG tablet Take 1 tablet (10 mg total) by mouth daily.  . fluticasone (FLONASE) 50 MCG/ACT nasal spray Place 2 sprays into both nostrils daily.  Marland Kitchen levothyroxine (SYNTHROID, LEVOTHROID) 50 MCG tablet TAKE 1 TABLET (50 MCG TOTAL) BY MOUTH DAILY.  . naproxen (NAPROSYN) 500 MG tablet Take 1 tablet (500 mg total) by mouth 2 (two) times daily with a meal.     1. Hypothyroidism due to acquired atrophy of thyroid  Last TSH was in normal range. Currently on synthroid 65mg daily. Is not aware of any problems  2. Fluid retention in legs  Has swelling if she is on her feet a lot- does not take any meds for this. Denies SOB  3. Severe obesity (BMI >= 40) (HCC)  Weight is up 16 lbs since january    New complaints: None today  Social history: Works in school system    Review of Systems  Constitutional: Negative for activity change and appetite change.  HENT: Negative.   Eyes: Negative for pain.  Respiratory: Negative for shortness of breath.   Cardiovascular: Negative for chest pain, palpitations and leg swelling.  Gastrointestinal: Negative for abdominal pain.  Endocrine: Negative for polydipsia.  Genitourinary: Negative.   Skin: Negative for rash.  Neurological: Negative for dizziness, weakness and headaches.  Hematological: Does not bruise/bleed easily.  Psychiatric/Behavioral: Negative.   All other systems reviewed and are negative.      Objective:   Physical Exam  Constitutional: She is oriented to person, place, and time. She appears well-developed and well-nourished.  HENT:    Nose: Nose normal.  Mouth/Throat: Oropharynx is clear and moist.  Eyes: EOM are normal.  Neck: Trachea normal, normal range of motion and full passive range of motion without pain. Neck supple. No JVD present. Carotid bruit is not present. No thyromegaly present.  Cardiovascular: Normal rate, regular rhythm, normal heart sounds and intact distal pulses. Exam reveals no gallop and no friction rub.  No murmur heard. Pulmonary/Chest: Effort normal and breath sounds normal.  Abdominal: Soft. Bowel sounds are normal. She exhibits no distension and no mass. There is no tenderness.  Musculoskeletal: Normal range of motion. She exhibits edema (1+ bill lower ext).  Lymphadenopathy:    She has no cervical adenopathy.  Neurological: She is alert and oriented to person, place, and time. She has normal reflexes.  Skin: Skin is warm and dry.  Psychiatric: She has a normal mood and affect. Her behavior is normal. Judgment and thought content normal.    BP 121/82   Pulse 90   Temp (!) 97.2 F (36.2 C) (Oral)   Ht 5' 4" (1.626 m)   Wt (!) 389 lb 3.2 oz (176.5 kg)   BMI 66.81 kg/m       Assessment & Plan:  1. Hypothyroidism due to acquired atrophy of thyroid Labs pending- will send in rx after get labs back to make sure we do not need to change med dose - CMP14+EGFR - Lipid panel - Thyroid Panel With TSH  2. Fluid retention in legs Elevate  legs when sitting  3. Severe obesity (BMI >= 40) (HCC) Discussed diet and exercise for person with BMI >25 Will recheck weight in 3-6 months     Labs pending Health maintenance reviewed Diet and exercise encouraged Continue all meds Follow up  In 6 months   [place], FNP

## 2017-05-07 ENCOUNTER — Other Ambulatory Visit: Payer: BC Managed Care – PPO

## 2017-05-08 LAB — THYROID PANEL WITH TSH
Free Thyroxine Index: 1.8 (ref 1.2–4.9)
T3 Uptake Ratio: 23 % — ABNORMAL LOW (ref 24–39)
T4, Total: 7.9 ug/dL (ref 4.5–12.0)
TSH: 2.14 u[IU]/mL (ref 0.450–4.500)

## 2017-05-08 LAB — CMP14+EGFR
ALT: 32 IU/L (ref 0–32)
AST: 20 IU/L (ref 0–40)
Albumin/Globulin Ratio: 1.8 (ref 1.2–2.2)
Albumin: 4.3 g/dL (ref 3.5–5.5)
Alkaline Phosphatase: 80 IU/L (ref 39–117)
BUN/Creatinine Ratio: 17 (ref 9–23)
BUN: 10 mg/dL (ref 6–24)
Bilirubin Total: 0.3 mg/dL (ref 0.0–1.2)
CO2: 23 mmol/L (ref 20–29)
Calcium: 9.2 mg/dL (ref 8.7–10.2)
Chloride: 99 mmol/L (ref 96–106)
Creatinine, Ser: 0.59 mg/dL (ref 0.57–1.00)
GFR calc Af Amer: 129 mL/min/{1.73_m2} (ref >59)
GFR calc non Af Amer: 112 mL/min/{1.73_m2} (ref >59)
Globulin, Total: 2.4 g/dL (ref 1.5–4.5)
Glucose: 72 mg/dL (ref 65–99)
Potassium: 4.4 mmol/L (ref 3.5–5.2)
Sodium: 139 mmol/L (ref 134–144)
Total Protein: 6.7 g/dL (ref 6.0–8.5)

## 2017-05-08 LAB — LIPID PANEL
Chol/HDL Ratio: 3.7 ratio (ref 0.0–4.4)
Cholesterol, Total: 176 mg/dL (ref 100–199)
HDL: 47 mg/dL (ref >39)
LDL Calculated: 105 mg/dL — ABNORMAL HIGH (ref 0–99)
Triglycerides: 119 mg/dL (ref 0–149)
VLDL Cholesterol Cal: 24 mg/dL (ref 5–40)

## 2017-05-10 ENCOUNTER — Telehealth: Payer: Self-pay | Admitting: *Deleted

## 2017-05-10 ENCOUNTER — Other Ambulatory Visit: Payer: Self-pay | Admitting: Nurse Practitioner

## 2017-05-10 DIAGNOSIS — E034 Atrophy of thyroid (acquired): Secondary | ICD-10-CM

## 2017-05-10 MED ORDER — LEVOTHYROXINE SODIUM 50 MCG PO TABS: ORAL_TABLET | ORAL | 3 refills | Status: DC

## 2017-05-10 NOTE — Telephone Encounter (Signed)
Pt aware of lab results Thyroid med refilled to [place]

## 2017-05-10 NOTE — Telephone Encounter (Signed)
What is the name of the medication? Levothyroxine -- MMM was waiting for labs to come back before calling in labs. Labs are back but medication hadn't been called in.  Have you contacted your pharmacy to request a refill? YES  Which pharmacy would you like this sent to? [facility] in [place]   Patient notified that their request is being sent to the clinical staff for review and that they should receive a call once it is complete. If they do not receive a call within 24 hours they can check with their pharmacy or our office.

## 2017-05-10 NOTE — Telephone Encounter (Signed)
Refill sent to pharmacy.   

## 2017-07-15 ENCOUNTER — Other Ambulatory Visit: Payer: Self-pay | Admitting: Nurse Practitioner

## 2017-07-15 DIAGNOSIS — Z1231 Encounter for screening mammogram for malignant neoplasm of breast: Secondary | ICD-10-CM

## 2017-08-05 ENCOUNTER — Ambulatory Visit
Admission: RE | Admit: 2017-08-05 | Discharge: 2017-08-05 | Disposition: A | Discharge status: Home / Self Care | Payer: BC Managed Care – PPO | Source: Ambulatory Visit | Attending: Nurse Practitioner | Admitting: Nurse Practitioner

## 2017-08-05 DIAGNOSIS — Z1231 Encounter for screening mammogram for malignant neoplasm of breast: Secondary | ICD-10-CM

## 2017-12-01 ENCOUNTER — Encounter: Payer: Self-pay | Admitting: Nurse Practitioner

## 2017-12-01 ENCOUNTER — Ambulatory Visit: Payer: BC Managed Care – PPO | Admitting: Nurse Practitioner

## 2017-12-01 VITALS — BP 139/80 | HR 80 | Temp 97.9°F | Ht 64.0 in | Wt 399.0 lb

## 2017-12-01 DIAGNOSIS — R5383 Other fatigue: Secondary | ICD-10-CM | POA: Diagnosis not present

## 2017-12-01 DIAGNOSIS — E034 Atrophy of thyroid (acquired): Secondary | ICD-10-CM

## 2017-12-01 DIAGNOSIS — Z23 Encounter for immunization: Secondary | ICD-10-CM | POA: Diagnosis not present

## 2017-12-01 NOTE — Progress Notes (Signed)
Subjective:    Patient ID: Sylvia Taylor, female    DOB: 08-03-1970, 47 y.o.   MRN: [id number]   Chief Complaint: Fatigue   HPI:  1. Hypothyroidism due to acquired atrophy of thyroid  Is currently on levothyroxin 61mg. Dose has not ever been changed. TSH was 2.1. She is complaining of constant fatigue, if she sits down and gets still she falls asleep. Her weiht keeps going up despite no change on diet. She does walk a couple of times a week.  2. Severe obesity (BMI >= 40) (HCC)  Weight is up 10 lbs since last visit.  3. Need for immunization against influenza  Will get flu shot today    Outpatient Encounter Medications as of 12/01/2017  Medication Sig  . cetirizine (ZYRTEC) 10 MG tablet Take 1 tablet (10 mg total) by mouth daily.  . fluticasone (FLONASE) 50 MCG/ACT nasal spray Place 2 sprays into both nostrils daily.  .Marland Kitchenlevothyroxine (SYNTHROID, LEVOTHROID) 50 MCG tablet TAKE 1 TABLET (50 MCG TOTAL) BY MOUTH DAILY.   No facility-administered encounter medications on file as of 12/01/2017.       New complaints:  Social history: Works for school system    Review of Systems  Constitutional: Negative for activity change and appetite change.  HENT: Negative.   Eyes: Negative for pain.  Respiratory: Negative for shortness of breath.   Cardiovascular: Negative for chest pain, palpitations and leg swelling.  Gastrointestinal: Negative for abdominal pain.  Endocrine: Negative for polydipsia.  Genitourinary: Negative.   Skin: Negative for rash.  Neurological: Negative for dizziness, weakness and headaches.  Hematological: Does not bruise/bleed easily.  Psychiatric/Behavioral: Negative.   All other systems reviewed and are negative.      Objective:   Physical Exam  Constitutional: She is oriented to person, place, and time. She appears well-developed and well-nourished. No distress.  HENT:  Head: Normocephalic.  Nose: Nose normal.  Mouth/Throat: Oropharynx is clear  and moist.  Eyes: Pupils are equal, round, and reactive to light. EOM are normal.  Neck: Normal range of motion. Neck supple. No JVD present. Carotid bruit is not present.  Cardiovascular: Normal rate, regular rhythm, normal heart sounds and intact distal pulses.  Pulmonary/Chest: Effort normal and breath sounds normal. No respiratory distress. She has no wheezes. She has no rales. She exhibits no tenderness.  Abdominal: Soft. Normal appearance, normal aorta and bowel sounds are normal. She exhibits no distension, no abdominal bruit, no pulsatile midline mass and no mass. There is no splenomegaly or hepatomegaly. There is no tenderness.  Musculoskeletal: Normal range of motion. She exhibits no edema.  Lymphadenopathy:    She has no cervical adenopathy.  Neurological: She is alert and oriented to person, place, and time. She has normal reflexes.  Skin: Skin is warm and dry.  Psychiatric: She has a normal mood and affect. Her behavior is normal. Judgment and thought content normal.  Nursing note and vitals reviewed.  BP 139/80   Pulse 80   Temp 97.9 F (36.6 C)   Ht '5\' 4"'  (1.626 m)   Wt (!) 399 lb (181 kg)   BMI 68.49 kg/m    ekg NSR    Assessment & Plan:  DBRALYNN VELADORcomes in today with chief complaint of Fatigue   Diagnosis and orders addressed:  1. Hypothyroidism due to acquired atrophy of thyroid Labs pending - CMP14+EGFR - Thyroid Panel With TSH  2. Severe obesity (BMI >= 40) (HCC) Discuss weight loss options as well  as bariatric surgery options  3. Need for immunization against influenza - Flu Vaccine QUAD 36+ mos IM  4. Fatigue, unspecified type Las pending - Anemia Profile B - Vitamin B12 - Ambulatory referral to Neurology - EKG 12-Lead   Labs pending Health Maintenance reviewed Diet and exercise encouraged  Follow up plan: 6 months   Mary-Margaret Hassell Done, FNP

## 2017-12-01 NOTE — Patient Instructions (Signed)
Exercising to Lose Weight Exercising can help you to lose weight. In order to lose weight through exercise, you need to do vigorous-intensity exercise. You can tell that you are exercising with vigorous intensity if you are breathing very hard and fast and cannot hold a conversation while exercising. Moderate-intensity exercise helps to maintain your current weight. You can tell that you are exercising at a moderate level if you have a higher heart rate and faster breathing, but you are still able to hold a conversation. How often should I exercise? Choose an activity that you enjoy and set realistic goals. Your health care provider can help you to make an activity plan that works for you. Exercise regularly as directed by your health care provider. This may include:  Doing resistance training twice each week, such as: ? Push-ups. ? Sit-ups. ? Lifting weights. ? Using resistance bands.  Doing a given intensity of exercise for a given amount of time. Choose from these options: ? 150 minutes of moderate-intensity exercise every week. ? 75 minutes of vigorous-intensity exercise every week. ? A mix of moderate-intensity and vigorous-intensity exercise every week.  Children, pregnant women, people who are out of shape, people who are overweight, and older adults may need to consult a health care provider for individual recommendations. If you have any sort of medical condition, be sure to consult your health care provider before starting a new exercise program. What are some activities that can help me to lose weight?  Walking at a rate of at least 4.5 miles an hour.  Jogging or running at a rate of 5 miles per hour.  Biking at a rate of at least 10 miles per hour.  Lap swimming.  Roller-skating or in-line skating.  Cross-country skiing.  Vigorous competitive sports, such as football, basketball, and soccer.  Jumping rope.  Aerobic dancing. How can I be more active in my day-to-day  activities?  Use the stairs instead of the elevator.  Take a walk during your lunch break.  If you drive, park your car farther away from work or school.  If you take public transportation, get off one stop early and walk the rest of the way.  Make all of your phone calls while standing up and walking around.  Get up, stretch, and walk around every 30 minutes throughout the day. What guidelines should I follow while exercising?  Do not exercise so much that you hurt yourself, feel dizzy, or get very short of breath.  Consult your health care provider prior to starting a new exercise program.  Wear comfortable clothes and shoes with good support.  Drink plenty of water while you exercise to prevent dehydration or heat stroke. Body water is lost during exercise and must be replaced.  Work out until you breathe faster and your heart beats faster. This information is not intended to replace advice given to you by your health care provider. Make sure you discuss any questions you have with your health care provider. Document Released: 03/02/2010 Document Revised: 07/06/2015 Document Reviewed: 07/01/2013 Elsevier Interactive Patient Education  2018 [employer]vier Inc.  

## 2017-12-02 MED ORDER — LEVOTHYROXINE SODIUM 75 MCG PO TABS: 75.0000 ug | ORAL_TABLET | Freq: Every day | ORAL | 1 refills | Status: DC

## 2017-12-02 MED ORDER — LEVOTHYROXINE SODIUM 25 MCG PO TABS: 25.0000 ug | ORAL_TABLET | Freq: Every day | ORAL | 1 refills | Status: DC

## 2017-12-02 NOTE — Addendum Note (Signed)
Addended by: Chevis Pretty on: 12/02/2017 08:56 AM   Modules accepted: Orders

## 2017-12-02 NOTE — Addendum Note (Signed)
Addended by: Chevis Pretty on: 12/02/2017 09:01 AM   Modules accepted: Orders

## 2017-12-03 ENCOUNTER — Telehealth: Payer: Self-pay | Admitting: Nurse Practitioner

## 2017-12-03 DIAGNOSIS — E034 Atrophy of thyroid (acquired): Secondary | ICD-10-CM

## 2017-12-03 LAB — CMP14+EGFR
ALT: 34 IU/L — ABNORMAL HIGH (ref 0–32)
AST: 20 IU/L (ref 0–40)
Albumin/Globulin Ratio: 1.5 (ref 1.2–2.2)
Albumin: 4.6 g/dL (ref 3.5–5.5)
Alkaline Phosphatase: 98 IU/L (ref 39–117)
BUN/Creatinine Ratio: 19 (ref 9–23)
BUN: 12 mg/dL (ref 6–24)
Bilirubin Total: 0.3 mg/dL (ref 0.0–1.2)
CO2: 21 mmol/L (ref 20–29)
Calcium: 9.5 mg/dL (ref 8.7–10.2)
Chloride: 101 mmol/L (ref 96–106)
Creatinine, Ser: 0.63 mg/dL (ref 0.57–1.00)
GFR calc Af Amer: 125 mL/min/{1.73_m2} (ref >59)
GFR calc non Af Amer: 109 mL/min/{1.73_m2} (ref >59)
Globulin, Total: 3 g/dL (ref 1.5–4.5)
Glucose: 80 mg/dL (ref 65–99)
Potassium: 4.6 mmol/L (ref 3.5–5.2)
Sodium: 142 mmol/L (ref 134–144)
Total Protein: 7.6 g/dL (ref 6.0–8.5)

## 2017-12-03 LAB — ANEMIA PROFILE B
Basophils Absolute: 0.1 10*3/uL (ref 0.0–0.2)
Basos: 1 %
EOS (ABSOLUTE): 0.1 10*3/uL (ref 0.0–0.4)
Eos: 1 %
Ferritin: 70 ng/mL (ref 15–150)
Folate: 11.6 ng/mL (ref >3.0)
Hematocrit: 42.3 % (ref 34.0–46.6)
Hemoglobin: 13.8 g/dL (ref 11.1–15.9)
Immature Grans (Abs): 0.1 10*3/uL (ref 0.0–0.1)
Immature Granulocytes: 1 %
Iron Saturation: 15 % (ref 15–55)
Iron: 57 ug/dL (ref 27–159)
Lymphocytes Absolute: 2.1 10*3/uL (ref 0.7–3.1)
Lymphs: 23 %
MCH: 28.1 pg (ref 26.6–33.0)
MCHC: 32.6 g/dL (ref 31.5–35.7)
MCV: 86 fL (ref 79–97)
Monocytes Absolute: 0.5 10*3/uL (ref 0.1–0.9)
Monocytes: 5 %
Neutrophils Absolute: 6.2 10*3/uL (ref 1.4–7.0)
Neutrophils: 69 %
Platelets: 200 10*3/uL (ref 150–450)
RBC: 4.91 x10E6/uL (ref 3.77–5.28)
RDW: 12.9 % (ref 12.3–15.4)
Retic Ct Pct: 2.3 % (ref 0.6–2.6)
Total Iron Binding Capacity: 388 ug/dL (ref 250–450)
UIBC: 331 ug/dL (ref 131–425)
Vitamin B-12: 755 pg/mL (ref 232–1245)
WBC: 9 10*3/uL (ref 3.4–10.8)

## 2017-12-03 LAB — THYROID PANEL WITH TSH
Free Thyroxine Index: 2.6 (ref 1.2–4.9)
T3 Uptake Ratio: 27 % (ref 24–39)
T4, Total: 9.8 ug/dL (ref 4.5–12.0)
TSH: 8.12 u[IU]/mL — ABNORMAL HIGH (ref 0.450–4.500)

## 2017-12-04 ENCOUNTER — Telehealth: Payer: Self-pay | Admitting: *Deleted

## 2017-12-04 NOTE — Telephone Encounter (Signed)
done

## 2017-12-04 NOTE — Telephone Encounter (Signed)
Pt aware and future labs put on

## 2018-02-13 DIAGNOSIS — M25562 Pain in left knee: Secondary | ICD-10-CM | POA: Insufficient documentation

## 2018-02-13 DIAGNOSIS — M1712 Unilateral primary osteoarthritis, left knee: Secondary | ICD-10-CM | POA: Insufficient documentation

## 2018-02-27 ENCOUNTER — Other Ambulatory Visit: Payer: BC Managed Care – PPO

## 2018-02-27 DIAGNOSIS — E034 Atrophy of thyroid (acquired): Secondary | ICD-10-CM

## 2018-02-28 LAB — TSH: TSH: 4.36 u[IU]/mL (ref 0.450–4.500)

## 2018-03-09 DIAGNOSIS — M2242 Chondromalacia patellae, left knee: Secondary | ICD-10-CM | POA: Insufficient documentation

## 2018-03-23 DIAGNOSIS — R6889 Other general symptoms and signs: Secondary | ICD-10-CM | POA: Insufficient documentation

## 2018-05-29 ENCOUNTER — Other Ambulatory Visit: Payer: Self-pay | Admitting: Nurse Practitioner

## 2018-09-23 ENCOUNTER — Other Ambulatory Visit: Payer: Self-pay | Admitting: Nurse Practitioner

## 2018-09-23 DIAGNOSIS — Z1231 Encounter for screening mammogram for malignant neoplasm of breast: Secondary | ICD-10-CM

## 2018-11-06 ENCOUNTER — Other Ambulatory Visit: Payer: Self-pay

## 2018-11-06 ENCOUNTER — Ambulatory Visit
Admission: RE | Admit: 2018-11-06 | Discharge: 2018-11-06 | Disposition: A | Discharge status: Home / Self Care | Payer: BC Managed Care – PPO | Source: Ambulatory Visit | Attending: Nurse Practitioner | Admitting: Nurse Practitioner

## 2018-11-06 DIAGNOSIS — Z1231 Encounter for screening mammogram for malignant neoplasm of breast: Secondary | ICD-10-CM

## 2018-12-11 ENCOUNTER — Ambulatory Visit: Payer: BC Managed Care – PPO

## 2018-12-28 ENCOUNTER — Telehealth: Payer: BC Managed Care – PPO | Admitting: Physician Assistant

## 2018-12-28 DIAGNOSIS — J019 Acute sinusitis, unspecified: Secondary | ICD-10-CM | POA: Diagnosis not present

## 2018-12-28 MED ORDER — AMOXICILLIN-POT CLAVULANATE 875-125 MG PO TABS: 1.0000 | ORAL_TABLET | Freq: Two times a day (BID) | ORAL | 0 refills | Status: DC

## 2018-12-28 NOTE — Progress Notes (Signed)

## 2019-02-19 ENCOUNTER — Other Ambulatory Visit: Payer: Self-pay | Admitting: Nurse Practitioner

## 2019-03-07 ENCOUNTER — Other Ambulatory Visit: Payer: Self-pay | Admitting: Nurse Practitioner

## 2019-03-18 ENCOUNTER — Telehealth: Payer: Self-pay | Admitting: Nurse Practitioner

## 2019-03-19 ENCOUNTER — Encounter: Payer: Self-pay | Admitting: Nurse Practitioner

## 2019-03-19 ENCOUNTER — Ambulatory Visit: Payer: BC Managed Care – PPO | Admitting: Nurse Practitioner

## 2019-03-19 ENCOUNTER — Other Ambulatory Visit: Payer: Self-pay

## 2019-03-19 VITALS — BP 135/84 | HR 95 | Temp 97.1°F | Resp 20 | Ht 64.0 in | Wt >= 6400 oz

## 2019-03-19 DIAGNOSIS — J309 Allergic rhinitis, unspecified: Secondary | ICD-10-CM | POA: Diagnosis not present

## 2019-03-19 DIAGNOSIS — R600 Localized edema: Secondary | ICD-10-CM | POA: Diagnosis not present

## 2019-03-19 DIAGNOSIS — E034 Atrophy of thyroid (acquired): Secondary | ICD-10-CM | POA: Diagnosis not present

## 2019-03-19 NOTE — Progress Notes (Signed)
Subjective:    Patient ID: Sylvia Taylor, female    DOB: July 13, 1970, 49 y.o.   MRN: [id number]   Chief Complaint: Medical Management of Chronic Issues    HPI:  1. Allergic rhinitis, unspecified seasonality, unspecified trigger Denies sinus issues at this time. Takes zyrtec PRN.  2. Hypothyroidism due to acquired atrophy of thyroid Takes in the morning. Since December has been shedding more hair, more fatigue, skin feels stiff and itchy. Has also gained about 10 pounds since August even with dieting.  Lab Results  Component Value Date   TSH 4.360 02/27/2018     3. Fluid retention in legs Swelling in legs. No increase in severity. Difficult to exercise because legs feel stiff.  4. Severe obesity (BMI >= 40) (HCC) Has gained about 10 pounds since August even with trying to diet.  BMI Readings from Last 3 Encounters:  03/19/19 70.20 kg/m  12/01/17 68.49 kg/m  05/06/17 66.81 kg/m   Wt Readings from Last 3 Encounters:  03/19/19 (!) 409 lb (185.5 kg)  12/01/17 (!) 399 lb (181 kg)  05/06/17 (!) 389 lb 3.2 oz (176.5 kg)       Outpatient Encounter Medications as of 03/19/2019  Medication Sig  . cetirizine (ZYRTEC) 10 MG tablet Take 1 tablet (10 mg total) by mouth daily.  . fluticasone (FLONASE) 50 MCG/ACT nasal spray Place 2 sprays into both nostrils daily.  Marland Kitchen levothyroxine (SYNTHROID) 75 MCG tablet Take 1 tablet (75 mcg total) by mouth daily. (Needs to be seen before next refill)  . [DISCONTINUED] amoxicillin-clavulanate (AUGMENTIN) 875-125 MG tablet Take 1 tablet by mouth 2 (two) times daily.   No facility-administered encounter medications on file as of 03/19/2019.    Past Surgical History:  Procedure Laterality Date  . BREAST BIOPSY Left 05/27/2013  . CESAREAN SECTION    . KNEE ARTHROSCOPY Right 09/2014    Family History  Problem Relation Age of Onset  . Hypertension Father   . Sleep apnea Father   . Cancer Maternal Grandmother        ovarian and thyroid  cancer    New complaints: None.  Social history: Lives with husband and daughter. Good support system. 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  Controlled substance contract: N/A     Review of Systems  Constitutional: Negative.   HENT: Positive for mouth sores (fever blister).   Eyes: Negative.   Respiratory: Negative.   Cardiovascular: Positive for leg swelling.  Gastrointestinal: Negative.   Endocrine: Negative.   Genitourinary: Negative.   Musculoskeletal: Positive for joint swelling (knees bilaterally).  Skin: Negative.   Allergic/Immunologic: Negative.   Neurological: Negative.   Hematological: Negative.   Psychiatric/Behavioral: Negative.        Objective:   Physical Exam Vitals and nursing note reviewed.  Constitutional:      Appearance: Normal appearance. She is obese.  HENT:     Head: Normocephalic.     Right Ear: Tympanic membrane, ear canal and external ear normal.     Left Ear: Tympanic membrane, ear canal and external ear normal.     Nose: Nose normal.     Mouth/Throat:     Mouth: Mucous membranes are moist.     Pharynx: Oropharynx is clear.     Comments: Fever blister on bottom lip Eyes:     Conjunctiva/sclera: Conjunctivae normal.     Pupils: Pupils are equal, round, and reactive to light.  Cardiovascular:     Rate and Rhythm: Normal rate and regular rhythm.     Pulses: Normal pulses.     Heart sounds: Normal heart sounds.  Pulmonary:     Effort: Pulmonary effort is normal.     Breath sounds: Normal breath sounds.  Abdominal:     General: Abdomen is flat. Bowel sounds are normal.     Palpations: Abdomen is soft.  Musculoskeletal:     Cervical back: Normal range of motion and neck supple.     Right lower leg: Edema (3+) present.     Left lower leg: Edema (3+) present.  Skin:    General: Skin is warm and dry.     Capillary Refill: Capillary refill takes less than 2 seconds.   Neurological:     General: No focal deficit present.     Mental Status: She is alert and oriented to person, place, and time.  Psychiatric:        Mood and Affect: Mood normal.        Behavior: Behavior normal.        Thought Content: Thought content normal.        Judgment: Judgment normal.       BP 135/84   Pulse 95   Temp (!) 97.1 F (36.2 C) (Temporal)   Resp 20   Ht 5\' 4"  (1.626 m)   Wt (!) 409 lb (185.5 kg)   SpO2 100%   BMI 70.20 kg/m      Assessment & Plan:   Sylvia Taylor comes in today with chief complaint of Medical Management of Chronic Issues   Diagnosis and orders addressed:  1. Allergic rhinitis, unspecified seasonality, unspecified trigger Continue taking zyrtec when needed.  2. Hypothyroidism due to acquired atrophy of thyroid Recheck labs today. Continue Synthroid. Will make changes as needed based on lab results.  3. Fluid retention in legs Watch sodium in diet. Elevate legs when sitting.  4. Severe obesity (BMI >= 40) (HCC) Discussed diet and exercise for person with BMI >25 Will recheck weight in 3-6 months    Labs pending Health Maintenance reviewed Diet and exercise encouraged  Follow up plan: 6 months   [place], FNP

## 2019-03-20 LAB — CMP14+EGFR
ALT: 40 IU/L — ABNORMAL HIGH (ref 0–32)
AST: 23 IU/L (ref 0–40)
Albumin/Globulin Ratio: 1.5 (ref 1.2–2.2)
Albumin: 4.4 g/dL (ref 3.8–4.8)
Alkaline Phosphatase: 104 IU/L (ref 39–117)
BUN/Creatinine Ratio: 18 (ref 9–23)
BUN: 12 mg/dL (ref 6–24)
Bilirubin Total: 0.4 mg/dL (ref 0.0–1.2)
CO2: 23 mmol/L (ref 20–29)
Calcium: 9 mg/dL (ref 8.7–10.2)
Chloride: 98 mmol/L (ref 96–106)
Creatinine, Ser: 0.67 mg/dL (ref 0.57–1.00)
GFR calc Af Amer: 122 mL/min/{1.73_m2} (ref >59)
GFR calc non Af Amer: 106 mL/min/{1.73_m2} (ref >59)
Globulin, Total: 3 g/dL (ref 1.5–4.5)
Glucose: 91 mg/dL (ref 65–99)
Potassium: 4.5 mmol/L (ref 3.5–5.2)
Sodium: 138 mmol/L (ref 134–144)
Total Protein: 7.4 g/dL (ref 6.0–8.5)

## 2019-03-20 LAB — CBC WITH DIFFERENTIAL/PLATELET
Basophils Absolute: 0.1 10*3/uL (ref 0.0–0.2)
Basos: 1 %
EOS (ABSOLUTE): 0.1 10*3/uL (ref 0.0–0.4)
Eos: 1 %
Hematocrit: 41.5 % (ref 34.0–46.6)
Hemoglobin: 13.5 g/dL (ref 11.1–15.9)
Immature Grans (Abs): 0 10*3/uL (ref 0.0–0.1)
Immature Granulocytes: 0 %
Lymphocytes Absolute: 2.1 10*3/uL (ref 0.7–3.1)
Lymphs: 21 %
MCH: 27.4 pg (ref 26.6–33.0)
MCHC: 32.5 g/dL (ref 31.5–35.7)
MCV: 84 fL (ref 79–97)
Monocytes Absolute: 0.6 10*3/uL (ref 0.1–0.9)
Monocytes: 6 %
Neutrophils Absolute: 7.3 10*3/uL — ABNORMAL HIGH (ref 1.4–7.0)
Neutrophils: 71 %
Platelets: 240 10*3/uL (ref 150–450)
RBC: 4.92 x10E6/uL (ref 3.77–5.28)
RDW: 12.9 % (ref 11.7–15.4)
WBC: 10.2 10*3/uL (ref 3.4–10.8)

## 2019-03-20 LAB — LIPID PANEL
Chol/HDL Ratio: 3.7 ratio (ref 0.0–4.4)
Cholesterol, Total: 164 mg/dL (ref 100–199)
HDL: 44 mg/dL (ref >39)
LDL Chol Calc (NIH): 103 mg/dL — ABNORMAL HIGH (ref 0–99)
Triglycerides: 91 mg/dL (ref 0–149)
VLDL Cholesterol Cal: 17 mg/dL (ref 5–40)

## 2019-03-20 LAB — THYROID PANEL WITH TSH
Free Thyroxine Index: 2.7 (ref 1.2–4.9)
T3 Uptake Ratio: 25 % (ref 24–39)
T4, Total: 10.9 ug/dL (ref 4.5–12.0)
TSH: 4.65 u[IU]/mL — ABNORMAL HIGH (ref 0.450–4.500)

## 2019-03-30 ENCOUNTER — Ambulatory Visit: Payer: BC Managed Care – PPO | Admitting: Nurse Practitioner

## 2019-04-08 ENCOUNTER — Other Ambulatory Visit: Payer: Self-pay | Admitting: Nurse Practitioner

## 2019-04-18 ENCOUNTER — Ambulatory Visit: Payer: BC Managed Care – PPO

## 2019-04-19 ENCOUNTER — Telehealth: Payer: BC Managed Care – PPO | Admitting: Physician Assistant

## 2019-04-19 DIAGNOSIS — J349 Unspecified disorder of nose and nasal sinuses: Secondary | ICD-10-CM | POA: Diagnosis not present

## 2019-04-19 MED ORDER — MUCINEX 600 MG PO TB12: 600.0000 mg | ORAL_TABLET | Freq: Two times a day (BID) | ORAL | 0 refills | Status: DC | PRN

## 2019-04-19 MED ORDER — IPRATROPIUM BROMIDE 0.03 % NA SOLN: 2.0000 | Freq: Three times a day (TID) | NASAL | 0 refills | Status: DC | PRN

## 2019-04-19 NOTE — Progress Notes (Signed)
We are sorry that you are not feeling well.  Here is how we plan to help!  Based on what you have shared with me it looks like you have sinusitis.  Sinusitis is inflammation and infection in the sinus cavities of the head.  Based on your presentation I believe you most likely have Acute Viral Sinusitis.This is an infection most likely caused by a virus. The vast majority of sinusitis is viral and not treated with antibiotics. Given the duration of your symptoms, I feel your sinusitis is likely viral in nature.   There is not specific treatment for viral sinusitis other than to help you with the symptoms until the infection runs its course.  You may use an oral decongestant such as Mucinex D or if you have glaucoma or high blood pressure use plain Mucinex. Saline nasal spray help and can safely be used as often as needed for congestion, I have prescribed: Ipratropium Bromide nasal spray 0.03% 2 sprays in eah nostril 2-3 times a day. You can also use over the counter sinus medications as well as nasal saline, steam showers, and a humidifier.  You can take 1 to 2 tablets of Tylenol (350mg -1000mg  depending on the dose) every 6 hours as needed for pain.  Do not exceed 4000 mg of Tylenol daily.  If your pain persists you can take a dose of ibuprofen in between doses of Tylenol.  I usually recommend 400 to 600 mg of ibuprofen every 6 hours.  Take this with food to avoid upset stomach issues.    If your symptoms persist greater than 10 days or you develop a fever, you likely have bacterial sinusitis and may require antibiotics; current evidence recommends holding off on prescribing antibiotics unless a patient has had symptoms for 10 or more days.   Some authorities believe that zinc sprays or the use of Echinacea may shorten the course of your symptoms.  Sinus infections are not as easily transmitted as other respiratory infection, however we still recommend that you avoid close contact with loved ones,  especially the very young and elderly.  Remember to wash your hands thoroughly throughout the day as this is the number one way to prevent the spread of infection!  Home Care:  Only take medications as instructed by your medical team.  Do not take these medications with alcohol.  A steam or ultrasonic humidifier can help congestion.  You can place a towel over your head and breathe in the steam from hot water coming from a faucet.  Avoid close contacts especially the very young and the elderly.  Cover your mouth when you cough or sneeze.  Always remember to wash your hands.  Get Help Right Away If:  You develop worsening fever or sinus pain.  You develop a severe head ache or visual changes.  Your symptoms persist after you have completed your treatment plan.  Make sure you  Understand these instructions.  Will watch your condition.  Will get help right away if you are not doing well or get worse.  Your e-visit answers were reviewed by a board certified advanced clinical practitioner to complete your personal care plan.  Depending on the condition, your plan could have included both over the counter or prescription medications.  If there is a problem please reply  once you have received a response from your provider.  Your safety is important to [place].  If you have drug allergies check your prescription carefully.    You can use  MyChart to ask questions about today's visit, request a non-urgent call back, or ask for a work or school excuse for 24 hours related to this e-Visit. If it has been greater than 24 hours you will need to follow up with your provider, or enter a new e-Visit to address those concerns.  You will get an e-mail in the next two days asking about your experience.  I hope that your e-visit has been valuable and will speed your recovery. Thank you for using e-visits.  Greater than 5 minutes, yet less than 10 minutes of time have been spent researching,  coordinating, and implementing care for this patient today.

## 2019-05-21 ENCOUNTER — Other Ambulatory Visit: Payer: Self-pay

## 2019-05-21 ENCOUNTER — Encounter: Payer: Self-pay | Admitting: Nurse Practitioner

## 2019-05-21 ENCOUNTER — Ambulatory Visit: Payer: BC Managed Care – PPO | Admitting: Nurse Practitioner

## 2019-05-21 VITALS — BP 133/87 | HR 95 | Temp 97.5°F | Resp 20 | Ht 64.0 in | Wt >= 6400 oz

## 2019-05-21 DIAGNOSIS — E034 Atrophy of thyroid (acquired): Secondary | ICD-10-CM

## 2019-05-21 NOTE — Progress Notes (Signed)
   Subjective:    Patient ID: Sylvia Taylor, female    DOB: [birth year], 49 y.o.   MRN: [id number]   Chief Complaint: Recheck thyroid   HPI Pt was here 03/19/19 for chronic follow-up and TSH levels were elevated. Did not make any changes at that time and are rechecking thyroid levels today. She is having more fatigue recently. When she gets off work, she is exhausted and feels like she can barely function. She has been feeling like this since before Christmas. Reports feeling itchy and joints have been stiff, especially the knees. Has also been having increased hair loss which has gotten a little better since her last visit.     Review of Systems  Constitutional: Positive for activity change and fatigue.  HENT: Negative.   Eyes: Negative.   Respiratory: Negative.   Cardiovascular: Positive for leg swelling (bilateral).  Gastrointestinal: Negative.   Endocrine: Negative.   Genitourinary: Negative.   Musculoskeletal: Positive for arthralgias (knee pain/stiffness).  Skin: Negative.   Neurological: Negative.   Psychiatric/Behavioral: Negative.        Objective:   Physical Exam Vitals and nursing note reviewed.  Constitutional:      Appearance: Normal appearance.  HENT:     Head: Normocephalic.  Eyes:     Conjunctiva/sclera: Conjunctivae normal.  Cardiovascular:     Rate and Rhythm: Normal rate and regular rhythm.     Pulses: Normal pulses.     Heart sounds: Normal heart sounds.  Pulmonary:     Effort: Pulmonary effort is normal.     Breath sounds: Normal breath sounds.  Abdominal:     General: Bowel sounds are normal.     Palpations: Abdomen is soft.  Musculoskeletal:     Cervical back: Normal range of motion and neck supple.     Right lower leg: Edema (1+) present.     Left lower leg: Edema (1+) present.  Skin:    General: Skin is warm and dry.  Neurological:     Mental Status: She is alert and oriented to person, place, and time.    BP 133/87   Pulse 95   Temp  (!) 97.5 F (36.4 C) (Temporal)   Resp 20   Ht 5\' 4"  (1.626 m)   Wt (!) 410 lb (186 kg)   BMI 70.38 kg/m      Assessment & Plan:  Sylvia Taylor comes in today with chief complaint of Recheck thyroid   Diagnosis and orders addressed:  1. Hypothyroidism due to acquired atrophy of thyroid Continue current dose for now Will call with lab results - Thyroid Panel With TSH   Labs pending Health Maintenance reviewed Diet and exercise encouraged  Follow up plan: 6 months   [place], FNP

## 2019-05-22 LAB — THYROID PANEL WITH TSH
Free Thyroxine Index: 2.5 (ref 1.2–4.9)
T3 Uptake Ratio: 25 % (ref 24–39)
T4, Total: 10.1 ug/dL (ref 4.5–12.0)
TSH: 4.32 u[IU]/mL (ref 0.450–4.500)

## 2019-06-20 ENCOUNTER — Other Ambulatory Visit: Payer: Self-pay | Admitting: Nurse Practitioner

## 2019-09-16 ENCOUNTER — Ambulatory Visit: Payer: BC Managed Care – PPO | Admitting: Nurse Practitioner

## 2019-10-08 ENCOUNTER — Telehealth: Payer: Self-pay | Admitting: *Deleted

## 2019-10-08 NOTE — Telephone Encounter (Signed)
Patient called in complaining of chest burning under lower chest/ upper abdomen x 2 days, one month gas and bloating. Denies shortness of breath.   Scheduled appointment for Monday with on call provider, advised if symptoms worsen, develops shortness of breath or worsening chest pain/ arm pain, go to ER. Patient agreeable.

## 2019-10-11 ENCOUNTER — Other Ambulatory Visit: Payer: Self-pay

## 2019-10-11 ENCOUNTER — Encounter: Payer: Self-pay | Admitting: Family

## 2019-10-11 ENCOUNTER — Ambulatory Visit: Payer: BC Managed Care – PPO | Admitting: Family

## 2019-10-11 VITALS — BP 137/87 | HR 102 | Temp 96.4°F | Ht 64.0 in | Wt >= 6400 oz

## 2019-10-11 DIAGNOSIS — R1013 Epigastric pain: Secondary | ICD-10-CM | POA: Diagnosis not present

## 2019-10-11 DIAGNOSIS — K219 Gastro-esophageal reflux disease without esophagitis: Secondary | ICD-10-CM

## 2019-10-11 MED ORDER — SUCRALFATE 1 GM/10ML PO SUSP: 1.0000 g | Freq: Three times a day (TID) | ORAL | 0 refills | Status: DC

## 2019-10-11 MED ORDER — OMEPRAZOLE 20 MG PO CPDR: 20.0000 mg | DELAYED_RELEASE_CAPSULE | Freq: Two times a day (BID) | ORAL | 3 refills | Status: DC

## 2019-10-11 NOTE — Patient Instructions (Signed)
Gastroesophageal Reflux Disease, Adult Gastroesophageal reflux (GER) happens when acid from the stomach flows up into the tube that connects the mouth and the stomach (esophagus). Normally, food travels down the esophagus and stays in the stomach to be digested. However, when a person has GER, food and stomach acid sometimes move back up into the esophagus. If this becomes a more serious problem, the person may be diagnosed with a disease called gastroesophageal reflux disease (GERD). GERD occurs when the reflux:  Happens often.  Causes frequent or severe symptoms.  Causes problems such as damage to the esophagus. When stomach acid comes in contact with the esophagus, the acid may cause soreness (inflammation) in the esophagus. Over time, GERD may create small holes (ulcers) in the lining of the esophagus. What are the causes? This condition is caused by a problem with the muscle between the esophagus and the stomach (lower esophageal sphincter, or LES). Normally, the LES muscle closes after food passes through the esophagus to the stomach. When the LES is weakened or abnormal, it does not close properly, and that allows food and stomach acid to go back up into the esophagus. The LES can be weakened by certain dietary substances, medicines, and medical conditions, including:  Tobacco use.  Pregnancy.  Having a hiatal hernia.  Alcohol use.  Certain foods and beverages, such as coffee, chocolate, onions, and peppermint. What increases the risk? You are more likely to develop this condition if you:  Have an increased body weight.  Have a connective tissue disorder.  Use NSAID medicines. What are the signs or symptoms? Symptoms of this condition include:  Heartburn.  Difficult or painful swallowing.  The feeling of having a lump in the throat.  Abitter taste in the mouth.  Bad breath.  Having a large amount of saliva.  Having an upset or bloated  stomach.  Belching.  Chest pain. Different conditions can cause chest pain. Make sure you see your health care provider if you experience chest pain.  Shortness of breath or wheezing.  Ongoing (chronic) cough or a night-time cough.  Wearing away of tooth enamel.  Weight loss. How is this diagnosed? Your health care provider will take a medical history and perform a physical exam. To determine if you have mild or severe GERD, your health care provider may also monitor how you respond to treatment. You may also have tests, including:  A test to examine your stomach and esophagus with a small camera (endoscopy).  A test thatmeasures the acidity level in your esophagus.  A test thatmeasures how much pressure is on your esophagus.  A barium swallow or modified barium swallow test to show the shape, size, and functioning of your esophagus. How is this treated? The goal of treatment is to help relieve your symptoms and to prevent complications. Treatment for this condition may vary depending on how severe your symptoms are. Your health care provider may recommend:  Changes to your diet.  Medicine.  Surgery. Follow these instructions at home: Eating and drinking   Follow a diet as recommended by your health care provider. This may involve avoiding foods and drinks such as: ? Coffee and tea (with or without caffeine). ? Drinks that containalcohol. ? Energy drinks and sports drinks. ? Carbonated drinks or sodas. ? Chocolate and cocoa. ? Peppermint and mint flavorings. ? Garlic and onions. ? Horseradish. ? Spicy and acidic foods, including peppers, chili powder, curry powder, vinegar, hot sauces, and barbecue sauce. ? Citrus fruit juices and citrus   fruits, such as oranges, lemons, and limes. ? Tomato-based foods, such as red sauce, chili, salsa, and pizza with red sauce. ? Fried and fatty foods, such as donuts, french fries, potato chips, and high-fat dressings. ? High-fat  meats, such as hot dogs and fatty cuts of red and white meats, such as rib eye steak, sausage, ham, and bacon. ? High-fat dairy items, such as whole milk, butter, and cream cheese.  Eat small, frequent meals instead of large meals.  Avoid drinking large amounts of liquid with your meals.  Avoid eating meals during the 2-3 hours before bedtime.  Avoid lying down right after you eat.  Do not exercise right after you eat. Lifestyle   Do not use any products that contain nicotine or tobacco, such as cigarettes, e-cigarettes, and chewing tobacco. If you need help quitting, ask your health care provider.  Try to reduce your stress by using methods such as yoga or meditation. If you need help reducing stress, ask your health care provider.  If you are overweight, reduce your weight to an amount that is healthy for you. Ask your health care provider for guidance about a safe weight loss goal. General instructions  Pay attention to any changes in your symptoms.  Take over-the-counter and prescription medicines only as told by your health care provider. Do not take aspirin, ibuprofen, or other NSAIDs unless your health care provider told you to do so.  Wear loose-fitting clothing. Do not wear anything tight around your waist that causes pressure on your abdomen.  Raise (elevate) the head of your bed about 6 inches (15 cm).  Avoid bending over if this makes your symptoms worse.  Keep all follow-up visits as told by your health care provider. This is important. Contact a health care provider if:  You have: ? New symptoms. ? Unexplained weight loss. ? Difficulty swallowing or it hurts to swallow. ? Wheezing or a persistent cough. ? A hoarse voice.  Your symptoms do not improve with treatment. Get help right away if you:  Have pain in your arms, neck, jaw, teeth, or back.  Feel sweaty, dizzy, or light-headed.  Have chest pain or shortness of breath.  Vomit and your vomit looks  like blood or coffee grounds.  Faint.  Have stool that is bloody or black.  Cannot swallow, drink, or eat. Summary  Gastroesophageal reflux happens when acid from the stomach flows up into the esophagus. GERD is a disease in which the reflux happens often, causes frequent or severe symptoms, or causes problems such as damage to the esophagus.  Treatment for this condition may vary depending on how severe your symptoms are. Your health care provider may recommend diet and lifestyle changes, medicine, or surgery.  Contact a health care provider if you have new or worsening symptoms.  Take over-the-counter and prescription medicines only as told by your health care provider. Do not take aspirin, ibuprofen, or other NSAIDs unless your health care provider told you to do so.  Keep all follow-up visits as told by your health care provider. This is important. This information is not intended to replace advice given to you by your health care provider. Make sure you discuss any questions you have with your health care provider. Document Revised: 08/06/2017 Document Reviewed: 08/06/2017 [employer] Patient Education  2020 [employer]  

## 2019-10-11 NOTE — Progress Notes (Signed)
Subjective:    Patient ID: Sylvia Taylor, female    DOB: 1970-10-13, 49 y.o.   MRN: [id number]  Chief Complaint  Patient presents with  . Gastroesophageal Reflux    ulcer in the past feels the same happens about 2-3 hours after she eats, has tried nexium and pepci not relief.  . Gas   Pt presents to the office today with increased gas and GERD over the last two months. She reports she was taking aleve last week for knee pain and her abdominal pain was worse. She also reports her abdominal pain is worse 2-3 hours after she eats.   She has taken Nexium without relief. She reports she has had a stomach ulcer about 18 years ago and these symptoms are very similar.  Gastroesophageal Reflux She complains of abdominal pain, belching and nausea.  Abdominal Pain This is a new problem. The current episode started more than 1 month ago. The onset quality is sudden. The problem occurs intermittently. The problem has been gradually worsening. The pain is located in the epigastric region. The pain is at a severity of 6/10. The pain is moderate. The quality of the pain is sharp. Associated symptoms include anorexia, belching, constipation, flatus and nausea. Pertinent negatives include no diarrhea, dysuria, frequency, hematuria or vomiting. The pain is aggravated by eating. She has tried proton pump inhibitors for the symptoms. The treatment provided mild relief. Her past medical history is significant for GERD.      Review of Systems  Gastrointestinal: Positive for abdominal pain, anorexia, constipation, flatus and nausea. Negative for diarrhea and vomiting.  Genitourinary: Negative for dysuria, frequency and hematuria.  All other systems reviewed and are negative.      Objective:   Physical Exam Vitals reviewed.  Constitutional:      General: She is not in acute distress.    Appearance: She is well-developed. She is obese.  Eyes:     Pupils: Pupils are equal, round, and reactive to light.   Neck:     Thyroid: No thyromegaly.  Cardiovascular:     Rate and Rhythm: Normal rate and regular rhythm.     Heart sounds: Normal heart sounds. No murmur heard.   Pulmonary:     Effort: Pulmonary effort is normal. No respiratory distress.     Breath sounds: Normal breath sounds. No wheezing.  Abdominal:     General: Bowel sounds are normal. There is no distension.     Palpations: Abdomen is soft.     Tenderness: There is abdominal tenderness (RUQ).  Musculoskeletal:        General: No tenderness. Normal range of motion.     Cervical back: Normal range of motion and neck supple.  Skin:    General: Skin is warm and dry.  Neurological:     Mental Status: She is alert and oriented to person, place, and time.     Cranial Nerves: No cranial nerve deficit.     Deep Tendon Reflexes: Reflexes are normal and symmetric.  Psychiatric:        Behavior: Behavior normal.        Thought Content: Thought content normal.        Judgment: Judgment normal.      BP 137/87   Pulse (!) 102   Temp (!) 96.4 F (35.8 C)   Ht '5\' 4"'  (1.626 m)   Wt (!) 404 lb (183.3 kg)   SpO2 95%   BMI 69.35 kg/m  Assessment & Plan:  Sylvia Taylor comes in today with chief complaint of Gastroesophageal Reflux (ulcer in the past feels the same happens about 2-3 hours after she eats, has tried nexium and pepci not relief.) and Gas   Diagnosis and orders addressed:  1. Epigastric pain - CBC with Differential/Platelet - H Pylori, IGM, IGG, IGA AB - omeprazole (PRILOSEC) 20 MG capsule; Take 1 capsule (20 mg total) by mouth 2 (two) times daily before a meal.  Dispense: 180 capsule; Refill: 3 - sucralfate (CARAFATE) 1 GM/10ML suspension; Take 10 mLs (1 g total) by mouth 4 (four) times daily -  with meals and at bedtime.  Dispense: 420 mL; Refill: 0 - BMP8+EGFR  2. Gastroesophageal reflux disease, unspecified whether esophagitis present - CBC with Differential/Platelet - H Pylori, IGM, IGG, IGA AB -  omeprazole (PRILOSEC) 20 MG capsule; Take 1 capsule (20 mg total) by mouth 2 (two) times daily before a meal.  Dispense: 180 capsule; Refill: 3 - sucralfate (CARAFATE) 1 GM/10ML suspension; Take 10 mLs (1 g total) by mouth 4 (four) times daily -  with meals and at bedtime.  Dispense: 420 mL; Refill: 0 - BMP8+EGFR   Labs pending Avoid NSAID's  -Diet discussed- Avoid fried, spicy, citrus foods, caffeine and alcohol -Do not eat 2-3 hours before bedtime -Encouraged small frequent meals -RTO if symptoms worsen or do not improve, may need referral to [place], FNP

## 2019-10-12 ENCOUNTER — Telehealth: Payer: Self-pay | Admitting: *Deleted

## 2019-10-12 LAB — H PYLORI, IGM, IGG, IGA AB
H pylori, IgM Abs: <9 units (ref 0.0–8.9)
H. pylori, IgA Abs: <9 units (ref 0.0–8.9)
H. pylori, IgG AbS: 0.39 Index Value (ref 0.00–0.79)

## 2019-10-12 LAB — CBC WITH DIFFERENTIAL/PLATELET
Basophils Absolute: 0.1 10*3/uL (ref 0.0–0.2)
Basos: 1 %
EOS (ABSOLUTE): 0.1 10*3/uL (ref 0.0–0.4)
Eos: 1 %
Hematocrit: 38.3 % (ref 34.0–46.6)
Hemoglobin: 12.7 g/dL (ref 11.1–15.9)
Immature Grans (Abs): 0 10*3/uL (ref 0.0–0.1)
Immature Granulocytes: 0 %
Lymphocytes Absolute: 2 10*3/uL (ref 0.7–3.1)
Lymphs: 19 %
MCH: 28.2 pg (ref 26.6–33.0)
MCHC: 33.2 g/dL (ref 31.5–35.7)
MCV: 85 fL (ref 79–97)
Monocytes Absolute: 0.7 10*3/uL (ref 0.1–0.9)
Monocytes: 7 %
Neutrophils Absolute: 7.2 10*3/uL — ABNORMAL HIGH (ref 1.4–7.0)
Neutrophils: 72 %
Platelets: 214 10*3/uL (ref 150–450)
RBC: 4.51 x10E6/uL (ref 3.77–5.28)
RDW: 13.4 % (ref 11.7–15.4)
WBC: 10.1 10*3/uL (ref 3.4–10.8)

## 2019-10-12 LAB — BMP8+EGFR
BUN/Creatinine Ratio: 16 (ref 9–23)
BUN: 12 mg/dL (ref 6–24)
CO2: 29 mmol/L (ref 20–29)
Calcium: 9.6 mg/dL (ref 8.7–10.2)
Chloride: 98 mmol/L (ref 96–106)
Creatinine, Ser: 0.75 mg/dL (ref 0.57–1.00)
GFR calc Af Amer: 110 mL/min/{1.73_m2} (ref >59)
GFR calc non Af Amer: 95 mL/min/{1.73_m2} (ref >59)
Glucose: 99 mg/dL (ref 65–99)
Potassium: 4.2 mmol/L (ref 3.5–5.2)
Sodium: 140 mmol/L (ref 134–144)

## 2019-10-12 MED ORDER — SUCRALFATE 1 G PO TABS: 1.0000 g | ORAL_TABLET | Freq: Three times a day (TID) | ORAL | 3 refills | Status: DC

## 2019-10-12 NOTE — Telephone Encounter (Signed)
RX changed 

## 2019-10-12 NOTE — Telephone Encounter (Signed)
PA came in for pt  Sucralfate 1gm/74ml susp.  Plan covers TABS not Susp.  Please change to tabs or another med.

## 2019-11-15 ENCOUNTER — Other Ambulatory Visit: Payer: Self-pay | Admitting: Nurse Practitioner

## 2019-11-15 DIAGNOSIS — Z1231 Encounter for screening mammogram for malignant neoplasm of breast: Secondary | ICD-10-CM

## 2019-12-03 ENCOUNTER — Ambulatory Visit: Payer: BC Managed Care – PPO

## 2020-01-28 ENCOUNTER — Other Ambulatory Visit: Payer: Self-pay | Admitting: Nurse Practitioner

## 2020-01-28 DIAGNOSIS — K921 Melena: Secondary | ICD-10-CM

## 2020-01-31 ENCOUNTER — Ambulatory Visit
Admission: RE | Admit: 2020-01-31 | Discharge: 2020-01-31 | Disposition: A | Discharge status: Home / Self Care | Payer: BC Managed Care – PPO | Source: Ambulatory Visit | Attending: Nurse Practitioner | Admitting: Nurse Practitioner

## 2020-01-31 ENCOUNTER — Other Ambulatory Visit: Payer: Self-pay

## 2020-01-31 DIAGNOSIS — Z1231 Encounter for screening mammogram for malignant neoplasm of breast: Secondary | ICD-10-CM

## 2020-02-09 DIAGNOSIS — N926 Irregular menstruation, unspecified: Secondary | ICD-10-CM | POA: Insufficient documentation

## 2020-02-14 ENCOUNTER — Telehealth: Payer: Self-pay | Admitting: Nurse Practitioner

## 2020-02-14 NOTE — Telephone Encounter (Signed)
No need to stay on shingles medication. For covid just treat symptoms

## 2020-02-14 NOTE — Telephone Encounter (Signed)
Pt has shingles and COVID - she wanted to see if she should finish her medicine for shingles or if there is anything else that she can do to help shingles or COVID

## 2020-02-14 NOTE — Telephone Encounter (Signed)
Patient aware.

## 2020-02-14 NOTE — Telephone Encounter (Signed)
Please advise 

## 2020-02-18 ENCOUNTER — Ambulatory Visit: Payer: BC Managed Care – PPO | Admitting: Nurse Practitioner

## 2020-02-29 ENCOUNTER — Other Ambulatory Visit (HOSPITAL_COMMUNITY): Payer: Self-pay | Admitting: Gastroenterology

## 2020-02-29 ENCOUNTER — Other Ambulatory Visit: Payer: Self-pay | Admitting: Gastroenterology

## 2020-02-29 DIAGNOSIS — R1011 Right upper quadrant pain: Secondary | ICD-10-CM

## 2020-03-01 ENCOUNTER — Other Ambulatory Visit: Payer: Self-pay | Admitting: Gastroenterology

## 2020-03-17 NOTE — Progress Notes (Signed)
Attempted to obtain medical history via telephone, unable to reach at this time. I left a voicemail to return pre surgical testing department's phone call.  

## 2020-03-20 ENCOUNTER — Ambulatory Visit (HOSPITAL_COMMUNITY)
Admission: RE | Admit: 2020-03-20 | Discharge: 2020-03-20 | Disposition: A | Discharge status: Home / Self Care | Payer: BC Managed Care – PPO | Source: Ambulatory Visit | Attending: Gastroenterology | Admitting: Gastroenterology

## 2020-03-20 ENCOUNTER — Ambulatory Visit (HOSPITAL_COMMUNITY): Payer: BC Managed Care – PPO

## 2020-03-20 ENCOUNTER — Other Ambulatory Visit: Payer: Self-pay

## 2020-03-20 DIAGNOSIS — R1011 Right upper quadrant pain: Secondary | ICD-10-CM

## 2020-03-20 MED ORDER — MORPHINE SULFATE (PF) 4 MG/ML IV SOLN
INTRAVENOUS | Status: AC
  Filled 2020-03-20: qty 1

## 2020-03-20 MED ORDER — MORPHINE SULFATE (PF) 4 MG/ML IV SOLN
3.0000 mg | Freq: Once | INTRAVENOUS | Status: AC
  Administered 2020-03-20: 3 mg via INTRAVENOUS

## 2020-03-21 ENCOUNTER — Other Ambulatory Visit (HOSPITAL_COMMUNITY)
Admission: RE | Admit: 2020-03-21 | Discharge: 2020-03-21 | Disposition: A | Discharge status: Home / Self Care | Payer: BC Managed Care – PPO | Source: Ambulatory Visit | Attending: Gastroenterology | Admitting: Gastroenterology

## 2020-03-21 DIAGNOSIS — Z20822 Contact with and (suspected) exposure to covid-19: Secondary | ICD-10-CM | POA: Insufficient documentation

## 2020-03-21 DIAGNOSIS — D120 Benign neoplasm of cecum: Secondary | ICD-10-CM | POA: Diagnosis not present

## 2020-03-21 DIAGNOSIS — Z01812 Encounter for preprocedural laboratory examination: Secondary | ICD-10-CM | POA: Insufficient documentation

## 2020-03-21 DIAGNOSIS — Z1211 Encounter for screening for malignant neoplasm of colon: Secondary | ICD-10-CM | POA: Diagnosis not present

## 2020-03-21 DIAGNOSIS — K5730 Diverticulosis of large intestine without perforation or abscess without bleeding: Secondary | ICD-10-CM | POA: Diagnosis not present

## 2020-03-21 LAB — SARS CORONAVIRUS 2 (TAT 6-24 HRS): SARS Coronavirus 2: NEGATIVE

## 2020-03-24 ENCOUNTER — Ambulatory Visit (HOSPITAL_COMMUNITY): Payer: BC Managed Care – PPO | Admitting: Anesthesiology

## 2020-03-24 ENCOUNTER — Ambulatory Visit (HOSPITAL_COMMUNITY)
Admission: RE | Admit: 2020-03-24 | Discharge: 2020-03-24 | Disposition: A | Discharge status: Home / Self Care | Payer: BC Managed Care – PPO | Attending: Gastroenterology | Admitting: Gastroenterology

## 2020-03-24 ENCOUNTER — Encounter (HOSPITAL_COMMUNITY)
Admission: RE | Disposition: A | Discharge status: Home / Self Care | Payer: Self-pay | Source: Home / Self Care | Attending: Gastroenterology

## 2020-03-24 ENCOUNTER — Other Ambulatory Visit: Payer: Self-pay

## 2020-03-24 ENCOUNTER — Encounter (HOSPITAL_COMMUNITY): Payer: Self-pay | Admitting: Gastroenterology

## 2020-03-24 DIAGNOSIS — Z1211 Encounter for screening for malignant neoplasm of colon: Secondary | ICD-10-CM | POA: Insufficient documentation

## 2020-03-24 DIAGNOSIS — Z20822 Contact with and (suspected) exposure to covid-19: Secondary | ICD-10-CM | POA: Insufficient documentation

## 2020-03-24 DIAGNOSIS — D120 Benign neoplasm of cecum: Secondary | ICD-10-CM | POA: Insufficient documentation

## 2020-03-24 DIAGNOSIS — K5730 Diverticulosis of large intestine without perforation or abscess without bleeding: Secondary | ICD-10-CM | POA: Insufficient documentation

## 2020-03-24 HISTORY — PX: COLONOSCOPY WITH PROPOFOL: SHX5780

## 2020-03-24 HISTORY — DX: Nausea with vomiting, unspecified: R11.2

## 2020-03-24 HISTORY — PX: POLYPECTOMY: SHX5525

## 2020-03-24 HISTORY — DX: Other specified postprocedural states: Z98.890

## 2020-03-24 SURGERY — COLONOSCOPY WITH PROPOFOL
Anesthesia: Monitor Anesthesia Care

## 2020-03-24 MED ORDER — ONDANSETRON HCL 4 MG/2ML IJ SOLN
INTRAMUSCULAR | Status: DC | PRN
  Administered 2020-03-24: 4 mg via INTRAVENOUS

## 2020-03-24 MED ORDER — LACTATED RINGERS IV SOLN
INTRAVENOUS | Status: DC
  Administered 2020-03-24: 1000 mL via INTRAVENOUS

## 2020-03-24 MED ORDER — PROPOFOL 1000 MG/100ML IV EMUL
INTRAVENOUS | Status: AC
  Filled 2020-03-24: qty 100

## 2020-03-24 MED ORDER — PROPOFOL 10 MG/ML IV BOLUS
INTRAVENOUS | Status: DC | PRN
  Administered 2020-03-24: 30 mg via INTRAVENOUS
  Administered 2020-03-24: 40 mg via INTRAVENOUS
  Administered 2020-03-24: 30 mg via INTRAVENOUS
  Administered 2020-03-24 (×2): 20 mg via INTRAVENOUS
  Administered 2020-03-24: 30 mg via INTRAVENOUS

## 2020-03-24 MED ORDER — SODIUM CHLORIDE 0.9 % IV SOLN: INTRAVENOUS | Status: DC

## 2020-03-24 MED ORDER — LIDOCAINE 2% (20 MG/ML) 5 ML SYRINGE
INTRAMUSCULAR | Status: DC | PRN
  Administered 2020-03-24: 60 mg via INTRAVENOUS
  Administered 2020-03-24: 40 mg via INTRAVENOUS

## 2020-03-24 SURGICAL SUPPLY — 21 items

## 2020-03-24 NOTE — Discharge Instructions (Signed)

## 2020-03-24 NOTE — Op Note (Signed)
[facility] Patient Name: Sylvia Taylor Procedure Date: 03/24/2020 MRN: [id number] Attending MD: Carol Ada , MD Date of Birth: Oct 08, 1970 CSN: [id number] Age: 50 Admit Type: Outpatient Procedure:                Colonoscopy Indications:              Screening for colorectal malignant neoplasm Providers:                Carol Ada, MD, Cleda Daub, RN, Fransico Setters                            [place], Technician Referring MD:              Medicines:                Propofol per Anesthesia Complications:            No immediate complications. Estimated Blood Loss:     Estimated blood loss: none. Procedure:                Pre-Anesthesia Assessment:                           - Prior to the procedure, a History and Physical                            was performed, and patient medications and                            allergies were reviewed. The patient's tolerance of                            previous anesthesia was also reviewed. The risks                            and benefits of the procedure and the sedation                            options and risks were discussed with the patient.                            All questions were answered, and informed consent                            was obtained. Prior Anticoagulants: The patient has                            taken no previous anticoagulant or antiplatelet                            agents. ASA Grade Assessment: III - A patient with                            severe systemic disease. After reviewing the risks  and benefits, the patient was deemed in                            satisfactory condition to undergo the procedure.                           - Sedation was administered by an anesthesia                            professional. Deep sedation was attained.                           After obtaining informed consent, the colonoscope                            was passed under direct  vision. Throughout the                            procedure, the patient's blood pressure, pulse, and                            oxygen saturations were monitored continuously. The                            CF-HQ190L ([id number]) Olympus colonoscope was                            introduced through the anus and advanced to the the                            cecum, identified by appendiceal orifice and                            ileocecal valve. The colonoscopy was performed                            without difficulty. The patient tolerated the                            procedure well. The quality of the bowel                            preparation was good. The ileocecal valve,                            appendiceal orifice, and rectum were photographed. Scope In: 9:18:28 AM Scope Out: 9:40:16 AM Scope Withdrawal Time: 0 hours 14 minutes 40 seconds  Total Procedure Duration: 0 hours 21 minutes 48 seconds  Findings:      Two sessile polyps were found in the cecum. The polyps were 2 to 3 mm in       size. These polyps were removed with a cold snare. Resection and       retrieval were complete.      Scattered small-mouthed diverticula were found in the sigmoid colon and  descending colon. Impression:               - Two 2 to 3 mm polyps in the cecum, removed with a                            cold snare. Resected and retrieved.                           - Diverticulosis in the sigmoid colon and in the                            descending colon. Moderate Sedation:      Not Applicable - Patient had care per Anesthesia. Recommendation:           - Patient has a contact number available for                            emergencies. The signs and symptoms of potential                            delayed complications were discussed with the                            patient. Return to normal activities tomorrow.                            Written discharge instructions were provided to the                             patient.                           - Resume previous diet.                           - Continue present medications.                           - Await pathology results.                           - Repeat colonoscopy in 7 years for surveillance. Procedure Code(s):        --- Professional ---                           [phone], Colonoscopy, flexible; with removal of                            tumor(s), polyp(s), or other lesion(s) by snare                            technique Diagnosis Code(s):        --- Professional ---                           Z12.11, Encounter for screening for malignant  neoplasm of colon                           K63.5, Polyp of colon                           K57.30, Diverticulosis of large intestine without                            perforation or abscess without bleeding CPT copyright 2019 American [employer] Association. All rights reserved. The codes documented in this report are preliminary and upon coder review may  be revised to meet current compliance requirements. Carol Ada, MD Carol Ada, MD 03/24/2020 9:45:40 AM This report has been signed electronically. Number of Addenda: 0

## 2020-03-24 NOTE — H&P (Signed)
  Sylvia Taylor   HPI: This 50 year old white female presents to the office for further evaluation of occasional rectal bleeding that she has had since September, 2021. She denies having any rectal pain. She has a history of constipation and can go 2 days without a BM. She takes fiber supplements or Metamucil that helps with the constipation. She usually has 2 BM's per day. She has occasional diarrhea after being constipated for a few days. She has had a lot of gas and bloating. She takes Omeprazole 20 mg once daily and Sucralfate BID for acid reflux with good control. She has had severe epigastric pain radiating to her chest with nausea, worse after greasy meals over the last several months. She denies having any vomiting. She has a good appetite and her weight has been stable. She denies having any complaints of dysphagia or odynophagia. She denies having a family history of colon cancer, celiac sprue or IBD.    Past Medical History:  Diagnosis Date  . Allergy   . PONV (postoperative nausea and vomiting)   . Thyroid disease     Past Surgical History:  Procedure Laterality Date  . BREAST BIOPSY Left 05/27/2013  . CESAREAN SECTION    . KNEE ARTHROSCOPY Right 09/2014    Family History  Problem Relation Age of Onset  . Hypertension Father   . Sleep apnea Father   . Cancer Maternal Grandmother        ovarian and thyroid cancer    Social History:  reports that she has never smoked. She has never used smokeless tobacco. She reports that she does not drink alcohol and does not use drugs.  Allergies: No Known Allergies  Medications:  Scheduled:  Continuous: . sodium chloride    . lactated ringers      No results found for this or any previous visit (from the past 24 hour(s)).   No results found.  ROS:  As stated above in the HPI otherwise negative.  Blood pressure (!) 170/96, pulse (!) 102, temperature 97.8 F (36.6 C), temperature source Oral, resp. rate 14, height 5\' 5"   (1.651 m), weight (!) 164.7 kg, last menstrual period 09/13/2015, SpO2 100 %.    PE: Gen: NAD, Alert and Oriented HEENT:  [place]/AT, EOMI Neck: Supple, no LAD Lungs: CTA Bilaterally CV: RRR without M/G/R ABD: Soft, NTND, +BS Ext: No C/C/E  Assessment/Plan: 1) Screening colonoscopy.  Dayne Dekay D 03/24/2020, 8:35 AM

## 2020-03-24 NOTE — Transfer of Care (Signed)
Immediate Anesthesia Transfer of Care Note  Patient: Sylvia Taylor  Procedure(s) Performed: COLONOSCOPY WITH PROPOFOL (N/A ) POLYPECTOMY  Patient Location: PACU  Anesthesia Type:MAC  Level of Consciousness: awake, alert  and oriented  Airway & Oxygen Therapy: Patient Spontanous Breathing and Patient connected to face mask oxygen  Post-op Assessment: Report given to RN and Post -op Vital signs reviewed and stable  Post vital signs: Reviewed and stable  Last Vitals:  Vitals Value Taken Time  BP    Temp    Pulse    Resp 17 03/24/20 0946  SpO2    Vitals shown include unvalidated device data.  Last Pain:  Vitals:   03/24/20 0828  TempSrc: Oral  PainSc: 0-No pain         Complications: No complications documented.

## 2020-03-24 NOTE — Anesthesia Postprocedure Evaluation (Signed)
Anesthesia Post Note  Patient: Sylvia Taylor  Procedure(s) Performed: COLONOSCOPY WITH PROPOFOL (N/A ) POLYPECTOMY     Patient location during evaluation: Endoscopy Anesthesia Type: MAC Level of consciousness: awake and sedated Pain management: pain level controlled Vital Signs Assessment: post-procedure vital signs reviewed and stable Respiratory status: spontaneous breathing Cardiovascular status: stable Postop Assessment: no apparent nausea or vomiting Anesthetic complications: no   No complications documented.  Last Vitals:  Vitals:   03/24/20 0950 03/24/20 0956  BP: (!) 141/87 (!) 141/87  Pulse: 89 89  Resp: 15 16  Temp:    SpO2: 97% 97%    Last Pain:  Vitals:   03/24/20 0946  TempSrc: Oral  PainSc: 0-No pain                 Huston Foley

## 2020-03-24 NOTE — Anesthesia Preprocedure Evaluation (Signed)
Anesthesia Evaluation  Patient identified by MRN, date of birth, ID band Patient awake    Reviewed: Allergy & Precautions, NPO status , Patient's Chart, lab work & pertinent test results  History of Anesthesia Complications (+) PONV and history of anesthetic complications  Airway Mallampati: I       Dental no notable dental hx.    Pulmonary neg pulmonary ROS,    Pulmonary exam normal        Cardiovascular Normal cardiovascular exam     Neuro/Psych negative neurological ROS  negative psych ROS   GI/Hepatic negative GI ROS, Neg liver ROS,   Endo/Other  Hypothyroidism Morbid obesity  Renal/GU negative Renal ROS  negative genitourinary   Musculoskeletal negative musculoskeletal ROS (+)   Abdominal (+) + obese,   Peds  Hematology negative hematology ROS (+)   Anesthesia Other Findings   Reproductive/Obstetrics                             Anesthesia Physical Anesthesia Plan  ASA: III  Anesthesia Plan: MAC   Post-op Pain Management:    Induction:   PONV Risk Score and Plan: 3 and Propofol infusion and TIVA  Airway Management Planned: Natural Airway and Simple Face Mask  Additional Equipment: None  Intra-op Plan:   Post-operative Plan:   Informed Consent: I have reviewed the patients History and Physical, chart, labs and discussed the procedure including the risks, benefits and alternatives for the proposed anesthesia with the patient or authorized representative who has indicated his/her understanding and acceptance.     Dental advisory given  Plan Discussed with:   Anesthesia Plan Comments:         Anesthesia Quick Evaluation

## 2020-03-27 ENCOUNTER — Encounter (HOSPITAL_COMMUNITY): Payer: Self-pay | Admitting: Gastroenterology

## 2020-03-27 LAB — SURGICAL PATHOLOGY

## 2020-04-06 ENCOUNTER — Other Ambulatory Visit: Payer: Self-pay | Admitting: Surgery

## 2020-04-07 ENCOUNTER — Other Ambulatory Visit: Payer: Self-pay | Admitting: Surgery

## 2020-04-17 ENCOUNTER — Other Ambulatory Visit (HOSPITAL_COMMUNITY)
Admission: RE | Admit: 2020-04-17 | Discharge: 2020-04-17 | Disposition: A | Discharge status: Home / Self Care | Payer: BC Managed Care – PPO | Source: Ambulatory Visit | Attending: Surgery | Admitting: Surgery

## 2020-04-17 DIAGNOSIS — U071 COVID-19: Secondary | ICD-10-CM | POA: Diagnosis not present

## 2020-04-17 DIAGNOSIS — Z01812 Encounter for preprocedural laboratory examination: Secondary | ICD-10-CM | POA: Diagnosis present

## 2020-04-17 LAB — SARS CORONAVIRUS 2 (TAT 6-24 HRS): SARS Coronavirus 2: POSITIVE — AB

## 2020-04-18 ENCOUNTER — Telehealth: Payer: Self-pay

## 2020-04-18 NOTE — Telephone Encounter (Signed)
Called to discuss with patient about COVID-19 symptoms and the use of one of the available treatments for those with mild to moderate Covid symptoms and at a high risk of hospitalization.  Pt appears to qualify for outpatient treatment due to co-morbid conditions and/or a member of an at-risk group in accordance with the FDA Emergency Use Authorization.    Symptom onset: None Vaccinated: Yes Booster? No Immunocompromised? No Qualifiers: Obesity  Pt. States she had COVID 19 this past January. No symptoms.  Marcello Moores

## 2020-04-18 NOTE — Progress Notes (Signed)
Notified Blanca at [facility] regarding pt's positive covid results. MD will be made aware.  Jacqlyn Larsen, RN

## 2020-05-10 ENCOUNTER — Encounter (HOSPITAL_COMMUNITY): Payer: Self-pay | Admitting: Surgery

## 2020-05-10 MED ORDER — DEXTROSE 5 % IV SOLN
3.0000 g | INTRAVENOUS | Status: DC
  Filled 2020-05-10: qty 3000

## 2020-05-10 NOTE — Progress Notes (Signed)
Unable to reach patient via phone.  Left a detailed message on machine with instructions for day of surgery.  PCP - Mary-Margaret Hassell Done, [place] Cardiologist - n/a  Chest x-ray - n/a EKG - 12/01/17 Stress Test - n/a ECHO - n/a Cardiac Cath - n/a  Sleep Study -  Unknown - ask on DOS CPAP - unknown  ERAS: Clear liquids til 12:30 PM on DOS.  Anesthesia review: Yes  STOP now taking any Aspirin (unless otherwise instructed by your surgeon), Aleve, Naproxen, Ibuprofen, Motrin, Advil, Goody's, BC's, all herbal medications, fish oil, and all vitamins.   Coronavirus Screening Positive covid on 04/17/20.

## 2020-05-10 NOTE — H&P (Signed)
Sylvia Taylor  Location: [facility] Patient #: [id number] DOB: 07-21-1970 Married / Language: English / Race: White Female   History of Present Illness  The patient is a 50 year old female who presents with abdominal pain.  Chief complaint: Epigastric and right upper quadrant abdominal pain, cholecystitis  This is a 50 year old female referred by Dr. Collene Mares for possible acalculous cholecystitis. The patient reports that she started having severe epigastric abdominal pain intermittently in August of last year. It then moved to the right upper quadrant. She has nausea but no vomiting. It is worse with fatty meals. She had an ultrasound which was unremarkable except for fatty liver. She then had a HIDA scan which showed nonvisualization of the gallbladder. She currently reports that her pain is only intermittent. She denies jaundice. She denies fever. Bowel movements are normal. She is otherwise without complaints. Her only abdominal surgery was a C-section.   Past Surgical History  Breast Biopsy  Left. Cesarean Section - 1  Colon Polyp Removal - Colonoscopy  Knee Surgery  Right. Oral Surgery   Diagnostic Studies History  Colonoscopy  within last year Mammogram  within last year Pap Smear  1-5 years ago  Allergies No Known Drug Allergies   Medication History Levothyroxine Sodium (75MCG Tablet, Oral) Active. Sucralfate (1GM Tablet, Oral) Active. Omeprazole (20MG  Capsule DR, Oral) Active. ZyrTEC (10MG  Tablet, Oral) Active. Probiotic (Oral) Active. Flonase (50MCG/ACT Suspension, Nasal) Active. Medications Reconciled  Social History Caffeine use  Tea. No alcohol use  No drug use  Tobacco use  Never smoker.  Family History  Anesthetic complications  Mother. Arthritis  Father. Cervical Cancer  Family Members In General. Colon Polyps  Mother. Depression  Father. Hypertension  Mother. Ovarian Cancer  Family Members In  General.  Pregnancy / Birth History  Age at menarche  6 years. Contraceptive History  Oral contraceptives. Gravida  2 Irregular periods  Maternal age  76-30 Para  1  Other Problems  Arthritis  Gastric Ulcer  Gastroesophageal Reflux Disease  General anesthesia - complications  Thyroid Disease     Review of Systems General Present- Appetite Loss and Weight Loss. Not Present- Chills, Fatigue, Fever, Night Sweats and Weight Gain. Skin Not Present- Change in Wart/Mole, Dryness, Hives, Jaundice, New Lesions, Non-Healing Wounds, Rash and Ulcer. HEENT Present- Wears glasses/contact lenses. Not Present- Earache, Hearing Loss, Hoarseness, Nose Bleed, Oral Ulcers, Ringing in the Ears, Seasonal Allergies, Sinus Pain, Sore Throat, Visual Disturbances and Yellow Eyes. Respiratory Present- Snoring. Not Present- Bloody sputum, Chronic Cough, Difficulty Breathing and Wheezing. Breast Not Present- Breast Mass, Breast Pain, Nipple Discharge and Skin Changes. Cardiovascular Not Present- Chest Pain, Difficulty Breathing Lying Down, Leg Cramps, Palpitations, Rapid Heart Rate, Shortness of Breath and Swelling of Extremities. Gastrointestinal Present- Abdominal Pain, Bloating, Excessive gas and Nausea. Not Present- Bloody Stool, Change in Bowel Habits, Chronic diarrhea, Constipation, Difficulty Swallowing, Gets full quickly at meals, Hemorrhoids, Indigestion, Rectal Pain and Vomiting. Female Genitourinary Not Present- Frequency, Nocturia, Painful Urination, Pelvic Pain and Urgency. Musculoskeletal Not Present- Back Pain, Joint Pain, Joint Stiffness, Muscle Pain, Muscle Weakness and Swelling of Extremities. Neurological Not Present- Decreased Memory, Fainting, Headaches, Numbness, Seizures, Tingling, Tremor, Trouble walking and Weakness. Psychiatric Not Present- Anxiety, Bipolar, Change in Sleep Pattern, Depression, Fearful and Frequent crying. Endocrine Not Present- Cold Intolerance, Excessive  Hunger, Hair Changes, Heat Intolerance, Hot flashes and New Diabetes. Hematology Not Present- Blood Thinners, Easy Bruising, Excessive bleeding, Gland problems, HIV and Persistent Infections.  Vitals  Weight: 366.38 lb  Height: 65in Body Surface Area: 2.56 m Body Mass Index: 60.97 kg/m  Temp.: 97.21F  Pulse: 113 (Regular)  P.OX: 98% (Room air) BP: 122/62(Sitting, Left Arm, Standard)       Physical Exam  The physical exam findings are as follows: Note: She appears comfortable and well on exam  Her abdomen is soft and obese. There is some mild to moderate tenderness in the right upper quadrant with some guarding to deep palpation. There are no hernias. I cannot palpate liver.    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 I reviewed her ultrasound and HIDA scan as well. This appears consistent with acalculus cholecystitis or severe dyskinesia with nonvisualization of the gallbladder. Her symptoms are consistent with this as well. Based on her examination and symptoms as well as the findings, I would recommend a cholecystectomy sooner than later. I discussed this with her in detail. I gave her literature regarding gallbladder surgery. We then discussed the risk of the surgery which includes but is not limited to bleeding, infection, injury to surrounding structures, the need to convert to an open procedure, the chances may not resolve all her symptoms, cardiopulmonary issues, DVT, postoperative recovery, etc. She understands and wished to proceed with surgery. A laparoscopic cholecystectomy will be scheduled urgently.  GI has also request a liver biopsy given her fatty liver and we discussed this as well

## 2020-05-11 ENCOUNTER — Ambulatory Visit (HOSPITAL_COMMUNITY): Payer: BC Managed Care – PPO | Admitting: Physician Assistant

## 2020-05-11 ENCOUNTER — Ambulatory Visit (HOSPITAL_COMMUNITY)
Admission: RE | Admit: 2020-05-11 | Discharge: 2020-05-11 | Disposition: A | Discharge status: Home / Self Care | Payer: BC Managed Care – PPO | Attending: Surgery | Admitting: Surgery

## 2020-05-11 ENCOUNTER — Encounter (HOSPITAL_COMMUNITY)
Admission: RE | Disposition: A | Discharge status: Home / Self Care | Payer: Self-pay | Source: Home / Self Care | Attending: Surgery

## 2020-05-11 ENCOUNTER — Other Ambulatory Visit: Payer: Self-pay

## 2020-05-11 ENCOUNTER — Encounter (HOSPITAL_COMMUNITY): Payer: Self-pay | Admitting: Surgery

## 2020-05-11 DIAGNOSIS — Z79890 Hormone replacement therapy (postmenopausal): Secondary | ICD-10-CM | POA: Insufficient documentation

## 2020-05-11 DIAGNOSIS — K8010 Calculus of gallbladder with chronic cholecystitis without obstruction: Secondary | ICD-10-CM | POA: Diagnosis not present

## 2020-05-11 DIAGNOSIS — Z79899 Other long term (current) drug therapy: Secondary | ICD-10-CM | POA: Diagnosis not present

## 2020-05-11 DIAGNOSIS — K760 Fatty (change of) liver, not elsewhere classified: Secondary | ICD-10-CM | POA: Diagnosis not present

## 2020-05-11 DIAGNOSIS — K819 Cholecystitis, unspecified: Secondary | ICD-10-CM | POA: Diagnosis present

## 2020-05-11 HISTORY — PX: CHOLECYSTECTOMY: SHX55

## 2020-05-11 HISTORY — DX: Gastro-esophageal reflux disease without esophagitis: K21.9

## 2020-05-11 HISTORY — PX: LIVER BIOPSY: SHX301

## 2020-05-11 HISTORY — DX: Hypothyroidism, unspecified: E03.9

## 2020-05-11 SURGERY — LAPAROSCOPIC CHOLECYSTECTOMY
Anesthesia: General | Site: Abdomen

## 2020-05-11 MED ORDER — FENTANYL CITRATE (PF) 250 MCG/5ML IJ SOLN
INTRAMUSCULAR | Status: AC
  Filled 2020-05-11: qty 5

## 2020-05-11 MED ORDER — SCOPOLAMINE 1 MG/3DAYS TD PT72
MEDICATED_PATCH | TRANSDERMAL | Status: DC | PRN
  Administered 2020-05-11: 1 via TRANSDERMAL

## 2020-05-11 MED ORDER — ACETAMINOPHEN 325 MG PO TABS: 325.0000 mg | ORAL_TABLET | ORAL | Status: DC | PRN

## 2020-05-11 MED ORDER — LIDOCAINE 2% (20 MG/ML) 5 ML SYRINGE
INTRAMUSCULAR | Status: AC
  Filled 2020-05-11: qty 5

## 2020-05-11 MED ORDER — CHLORHEXIDINE GLUCONATE 0.12 % MT SOLN
15.0000 mL | Freq: Once | OROMUCOSAL | Status: AC
  Administered 2020-05-11: 15 mL via OROMUCOSAL
  Filled 2020-05-11: qty 15

## 2020-05-11 MED ORDER — PROPOFOL 500 MG/50ML IV EMUL
INTRAVENOUS | Status: DC | PRN
  Administered 2020-05-11: 25 ug/kg/min via INTRAVENOUS

## 2020-05-11 MED ORDER — BUPIVACAINE HCL (PF) 0.25 % IJ SOLN
INTRAMUSCULAR | Status: DC | PRN
  Administered 2020-05-11: 20 mL

## 2020-05-11 MED ORDER — OXYCODONE HCL 5 MG PO TABS: 5.0000 mg | ORAL_TABLET | Freq: Four times a day (QID) | ORAL | 0 refills | Status: DC | PRN

## 2020-05-11 MED ORDER — LIDOCAINE 2% (20 MG/ML) 5 ML SYRINGE
INTRAMUSCULAR | Status: DC | PRN
  Administered 2020-05-11: 100 mg via INTRAVENOUS

## 2020-05-11 MED ORDER — 0.9 % SODIUM CHLORIDE (POUR BTL) OPTIME
TOPICAL | Status: DC | PRN
  Administered 2020-05-11: 1000 mL

## 2020-05-11 MED ORDER — CELECOXIB 200 MG PO CAPS
400.0000 mg | ORAL_CAPSULE | ORAL | Status: AC
  Administered 2020-05-11: 400 mg via ORAL
  Filled 2020-05-11: qty 2

## 2020-05-11 MED ORDER — MIDAZOLAM HCL 5 MG/5ML IJ SOLN
INTRAMUSCULAR | Status: DC | PRN
  Administered 2020-05-11: 2 mg via INTRAVENOUS

## 2020-05-11 MED ORDER — FENTANYL CITRATE (PF) 100 MCG/2ML IJ SOLN
25.0000 ug | INTRAMUSCULAR | Status: DC | PRN
Start: 2020-05-11 — End: 2020-05-12

## 2020-05-11 MED ORDER — BUPIVACAINE HCL (PF) 0.25 % IJ SOLN
INTRAMUSCULAR | Status: AC
  Filled 2020-05-11: qty 30

## 2020-05-11 MED ORDER — DIPHENHYDRAMINE HCL 50 MG/ML IJ SOLN
INTRAMUSCULAR | Status: AC
  Filled 2020-05-11: qty 1

## 2020-05-11 MED ORDER — SCOPOLAMINE 1 MG/3DAYS TD PT72
MEDICATED_PATCH | TRANSDERMAL | Status: AC
  Filled 2020-05-11: qty 1

## 2020-05-11 MED ORDER — DEXAMETHASONE SODIUM PHOSPHATE 10 MG/ML IJ SOLN
INTRAMUSCULAR | Status: DC | PRN
  Administered 2020-05-11: 10 mg via INTRAVENOUS

## 2020-05-11 MED ORDER — OXYCODONE HCL 5 MG/5ML PO SOLN: 5.0000 mg | Freq: Once | ORAL | Status: AC | PRN

## 2020-05-11 MED ORDER — ORAL CARE MOUTH RINSE: 15.0000 mL | Freq: Once | OROMUCOSAL | Status: AC

## 2020-05-11 MED ORDER — OXYCODONE HCL 5 MG PO TABS
5.0000 mg | ORAL_TABLET | Freq: Once | ORAL | Status: AC | PRN
Start: 2020-05-11 — End: 2020-05-11
  Administered 2020-05-11: 5 mg via ORAL

## 2020-05-11 MED ORDER — DEXTROSE 5 % IV SOLN
INTRAVENOUS | Status: DC | PRN
  Administered 2020-05-11: 3 g via INTRAVENOUS

## 2020-05-11 MED ORDER — OXYCODONE HCL 5 MG PO TABS
ORAL_TABLET | ORAL | Status: AC
  Filled 2020-05-11: qty 1

## 2020-05-11 MED ORDER — MEPERIDINE HCL 25 MG/ML IJ SOLN
6.2500 mg | INTRAMUSCULAR | Status: DC | PRN
Start: 2020-05-11 — End: 2020-05-12

## 2020-05-11 MED ORDER — MIDAZOLAM HCL 2 MG/2ML IJ SOLN
INTRAMUSCULAR | Status: AC
  Filled 2020-05-11: qty 2

## 2020-05-11 MED ORDER — PHENYLEPHRINE 40 MCG/ML (10ML) SYRINGE FOR IV PUSH (FOR BLOOD PRESSURE SUPPORT)
PREFILLED_SYRINGE | INTRAVENOUS | Status: DC | PRN
  Administered 2020-05-11 (×2): 40 ug via INTRAVENOUS
  Administered 2020-05-11: 80 ug via INTRAVENOUS
  Administered 2020-05-11: 120 ug via INTRAVENOUS

## 2020-05-11 MED ORDER — ROCURONIUM BROMIDE 10 MG/ML (PF) SYRINGE
PREFILLED_SYRINGE | INTRAVENOUS | Status: DC | PRN
  Administered 2020-05-11: 70 mg via INTRAVENOUS

## 2020-05-11 MED ORDER — FENTANYL CITRATE (PF) 250 MCG/5ML IJ SOLN
INTRAMUSCULAR | Status: DC | PRN
  Administered 2020-05-11: 100 ug via INTRAVENOUS
  Administered 2020-05-11: 50 ug via INTRAVENOUS

## 2020-05-11 MED ORDER — FENTANYL CITRATE (PF) 100 MCG/2ML IJ SOLN
INTRAMUSCULAR | Status: AC
  Administered 2020-05-11: 50 ug via INTRAVENOUS
  Filled 2020-05-11: qty 2

## 2020-05-11 MED ORDER — ENSURE PRE-SURGERY PO LIQD: 296.0000 mL | Freq: Once | ORAL | Status: DC

## 2020-05-11 MED ORDER — ONDANSETRON HCL 4 MG/2ML IJ SOLN
INTRAMUSCULAR | Status: DC | PRN
  Administered 2020-05-11: 4 mg via INTRAVENOUS

## 2020-05-11 MED ORDER — ACETAMINOPHEN 160 MG/5ML PO SOLN: 325.0000 mg | ORAL | Status: DC | PRN

## 2020-05-11 MED ORDER — CHLORHEXIDINE GLUCONATE CLOTH 2 % EX PADS: 6.0000 | MEDICATED_PAD | Freq: Once | CUTANEOUS | Status: DC

## 2020-05-11 MED ORDER — DIPHENHYDRAMINE HCL 50 MG/ML IJ SOLN
INTRAMUSCULAR | Status: DC | PRN
  Administered 2020-05-11: 12.5 mg via INTRAVENOUS

## 2020-05-11 MED ORDER — ONDANSETRON HCL 4 MG/2ML IJ SOLN: 4.0000 mg | Freq: Once | INTRAMUSCULAR | Status: DC | PRN

## 2020-05-11 MED ORDER — ALBUMIN HUMAN 5 % IV SOLN: INTRAVENOUS | Status: DC | PRN

## 2020-05-11 MED ORDER — STERILE WATER FOR IRRIGATION IR SOLN
Status: DC | PRN
  Administered 2020-05-11: 1000 mL

## 2020-05-11 MED ORDER — GABAPENTIN 300 MG PO CAPS
300.0000 mg | ORAL_CAPSULE | ORAL | Status: AC
  Administered 2020-05-11: 300 mg via ORAL
  Filled 2020-05-11: qty 1

## 2020-05-11 MED ORDER — LACTATED RINGERS IV SOLN: INTRAVENOUS | Status: DC

## 2020-05-11 MED ORDER — SUGAMMADEX SODIUM 200 MG/2ML IV SOLN
INTRAVENOUS | Status: DC | PRN
  Administered 2020-05-11: 200 mg via INTRAVENOUS

## 2020-05-11 MED ORDER — SODIUM CHLORIDE 0.9 % IR SOLN
Status: DC | PRN
  Administered 2020-05-11: 1000 mL

## 2020-05-11 MED ORDER — ONDANSETRON HCL 4 MG/2ML IJ SOLN
INTRAMUSCULAR | Status: AC
  Filled 2020-05-11: qty 2

## 2020-05-11 MED ORDER — ACETAMINOPHEN 500 MG PO TABS
1000.0000 mg | ORAL_TABLET | ORAL | Status: AC
  Administered 2020-05-11: 1000 mg via ORAL
  Filled 2020-05-11: qty 2

## 2020-05-11 MED ORDER — PROPOFOL 10 MG/ML IV BOLUS
INTRAVENOUS | Status: DC | PRN
  Administered 2020-05-11: 200 mg via INTRAVENOUS

## 2020-05-11 MED ORDER — DEXAMETHASONE SODIUM PHOSPHATE 10 MG/ML IJ SOLN
INTRAMUSCULAR | Status: AC
  Filled 2020-05-11: qty 1

## 2020-05-11 SURGICAL SUPPLY — 32 items
APPLIER CLIP 5 13 M/L LIGAMAX5 (MISCELLANEOUS) ×2
CANISTER SUCT 3000ML PPV (MISCELLANEOUS) ×2 IMPLANT
CHLORAPREP W/TINT 26 (MISCELLANEOUS) ×2 IMPLANT
CLIP APPLIE 5 13 M/L LIGAMAX5 (MISCELLANEOUS) ×1 IMPLANT
COVER SURGICAL LIGHT HANDLE (MISCELLANEOUS) ×2 IMPLANT
DERMABOND ADVANCED (GAUZE/BANDAGES/DRESSINGS) ×1
DERMABOND ADVANCED .7 DNX12 (GAUZE/BANDAGES/DRESSINGS) ×1 IMPLANT
DRSG TELFA 3X8 NADH (GAUZE/BANDAGES/DRESSINGS) ×2 IMPLANT
ELECT REM PT RETURN 9FT ADLT (ELECTROSURGICAL) ×2
ELECTRODE REM PT RTRN 9FT ADLT (ELECTROSURGICAL) ×1 IMPLANT
GLOVE SURG SIGNA 7.5 PF LTX (GLOVE) ×2 IMPLANT
GOWN STRL REUS W/ TWL LRG LVL3 (GOWN DISPOSABLE) ×2 IMPLANT
GOWN STRL REUS W/ TWL XL LVL3 (GOWN DISPOSABLE) ×1 IMPLANT
GOWN STRL REUS W/TWL LRG LVL3 (GOWN DISPOSABLE) ×4
GOWN STRL REUS W/TWL XL LVL3 (GOWN DISPOSABLE) ×2
KIT BASIN OR (CUSTOM PROCEDURE TRAY) ×2 IMPLANT
KIT TURNOVER KIT B (KITS) ×2 IMPLANT
NS IRRIG 1000ML POUR BTL (IV SOLUTION) ×2 IMPLANT
PAD ARMBOARD 7.5X6 YLW CONV (MISCELLANEOUS) ×2 IMPLANT
POUCH SPECIMEN RETRIEVAL 10MM (ENDOMECHANICALS) ×2 IMPLANT
SCISSORS LAP 5X35 DISP (ENDOMECHANICALS) ×2 IMPLANT
SET IRRIG TUBING LAPAROSCOPIC (IRRIGATION / IRRIGATOR) ×2 IMPLANT
SET TUBE SMOKE EVAC HIGH FLOW (TUBING) ×2 IMPLANT
SLEEVE ENDOPATH XCEL 5M (ENDOMECHANICALS) ×4 IMPLANT
SPECIMEN JAR SMALL (MISCELLANEOUS) ×2 IMPLANT
SUT MNCRL AB 4-0 PS2 18 (SUTURE) ×2 IMPLANT
TOWEL GREEN STERILE (TOWEL DISPOSABLE) ×2 IMPLANT
TOWEL GREEN STERILE FF (TOWEL DISPOSABLE) ×2 IMPLANT
TRAY LAPAROSCOPIC MC (CUSTOM PROCEDURE TRAY) ×2 IMPLANT
TROCAR XCEL BLUNT TIP 100MML (ENDOMECHANICALS) ×2 IMPLANT
TROCAR XCEL NON-BLD 5MMX100MML (ENDOMECHANICALS) ×2 IMPLANT
WATER STERILE IRR 1000ML POUR (IV SOLUTION) ×2 IMPLANT

## 2020-05-11 NOTE — Transfer of Care (Signed)
Immediate Anesthesia Transfer of Care Note  Patient: Sylvia Taylor  Procedure(s) Performed: LAPAROSCOPIC CHOLECYSTECTOMY (N/A Abdomen) LIVER BIOPSY (N/A Abdomen)  Patient Location: PACU  Anesthesia Type:General  Level of Consciousness: awake, alert  and sedated  Airway & Oxygen Therapy: Patient connected to face mask oxygen  Post-op Assessment: Post -op Vital signs reviewed and stable  Post vital signs: stable  Last Vitals:  Vitals Value Taken Time  BP    Temp    Pulse    Resp    SpO2      Last Pain:  Vitals:   05/11/20 1336  TempSrc: Oral         Complications: No complications documented.

## 2020-05-11 NOTE — Anesthesia Procedure Notes (Signed)
Procedure Name: Intubation Date/Time: 05/11/2020 4:17 PM Performed by: Imagene Riches, CRNA Pre-anesthesia Checklist: Patient identified, Emergency Drugs available, Suction available and Patient being monitored Patient Re-evaluated:Patient Re-evaluated prior to induction Oxygen Delivery Method: Circle System Utilized Preoxygenation: Pre-oxygenation with 100% oxygen Induction Type: IV induction Ventilation: Mask ventilation with difficulty, Two handed mask ventilation required and Oral airway inserted - appropriate to patient size Laryngoscope Size: Glidescope Grade View: Grade I Tube type: Oral Tube size: 7.5 mm Number of attempts: 1 Airway Equipment and Method: Stylet,  Oral airway and Video-laryngoscopy Placement Confirmation: ETT inserted through vocal cords under direct vision,  positive ETCO2 and breath sounds checked- equal and bilateral Secured at: 21 cm Tube secured with: Tape Dental Injury: Teeth and Oropharynx as per pre-operative assessment  Difficulty Due To: Difficulty was unanticipated Comments: Intubated by Rayburn Ma

## 2020-05-11 NOTE — Op Note (Signed)
Laparoscopic Cholecystectomy Operative Report   Preoperative diagnosis: Acalculous cholecystitis, fatty liver   Postoperative diagnosis: Same   Procedure: Laparoscopic cholecystectomy   Surgeon: Nathaneil Canary A. Ninfa Linden, M.D.  Asst.:  Dhrithi Riche E. Wallie Char, M.D.   Anesthesia: General   Indication:  Sylvia Taylor is a 50 y.o. woman with a history of right upper quadrant pain and nausea that is worse with fatty meals. Ultrasound did not show stones but did show concern for hepatic steatosis or hepatocellular disease. HIDA showed nonfilling of the gallbladder. Today she presents for laparoscopic cholecystectomy and liver biopsy.   Technique: The patient was brought to the operating room, placed supine on the operating table, and a general anesthetic was administered. The abdominal wall was then sterilely prepped and draped. A small supraumbilical incision was made through the skin, subcutaneous tissue, fascia, and peritoneum entering the peritoneal cavity under direct vision. A pursestring suture of 0 Vicryl was placed around the edges of the fascia. A Hassan trocar was introduced into the peritoneal cavity and a pneumoperitoneum was created by insufflation of carbon dioxide gas. The laparoscope was introduced into the trocar and no underlying bleeding or organ injury was noted. The patient was then placed in the reverse Trendelenburg position with the right side tilted slightly up.  Three more trochars were then placed into the abdominal cavity under laparoscopic vision. One in the epigastric area, and 2 in the right upper quadrant area. The gallbladder was visualized and the fundus was grasped and retracted toward the right shoulder.  The infundibulum was mobilized with dissection close to the gallbladder and retracted laterally. There was a significant amount of fat obscuring Calot's triangle which was dissected down. The cystic duct was identified and a window was created around it. The cystic artery was  also identified and a window was created around it. The critical view was achieved. A clip was placed at the neck of the gallbladder and the cystic duct was clipped 3 times on the biliary side, and then the cystic duct was divided sharply. No bile leak was noted from the cystic duct stump.  The cystic artery was then clipped and divided. Following this the gallbladder was dissected free from the liver using electrocautery. The gallbladder was then placed in a retrieval bag and removed from the abdominal cavity through the supraumbilical incision.  The gallbladder fossa was inspected, irrigated, and bleeding was controlled with electrocautery. Inspection showed that hemostasis was adequate and there was no evidence of bile leak.  The irrigation fluid was evacuated as much as possible.  The liver was inspected and appeared somewhat rounded but without significant abnormalities. A small segment of liver was grasped and elevated and excised using electrocautery. This was sent for pathology. The biopsy site was inspected and appeared hemostatic without evidence of bile leak.   The supraumbilical trocar was removed and the fascial defect was closed by tightening and tying down the pursestring suture under laparoscopic vision.  The remaining trochars were removed and the pneumoperitoneum was released. The skin incisions were closed with 4-0 Monocryl subcuticular stitches. Dermabond was applied.  The procedure was well-tolerated without any apparent complications. The patient was taken to the recovery room in satisfactory condition.  EBL: Minimal  Specimens: Gallbladder, Liver

## 2020-05-11 NOTE — Anesthesia Preprocedure Evaluation (Addendum)
Anesthesia Evaluation  Patient identified by MRN, date of birth, ID band Patient awake    Reviewed: Allergy & Precautions, NPO status , Patient's Chart, lab work & pertinent test results  History of Anesthesia Complications (+) PONV and history of anesthetic complications  Airway Mallampati: I  TM Distance: >3 FB Neck ROM: Full    Dental no notable dental hx. (+) Teeth Intact, Caps,    Pulmonary neg pulmonary ROS,    Pulmonary exam normal breath sounds clear to auscultation       Cardiovascular Normal cardiovascular exam Rhythm:Regular Rate:Normal     Neuro/Psych negative neurological ROS  negative psych ROS   GI/Hepatic Neg liver ROS, GERD  Medicated,  Endo/Other  Hypothyroidism Morbid obesity  Renal/GU negative Renal ROS  negative genitourinary   Musculoskeletal negative musculoskeletal ROS (+)   Abdominal (+) + obese,   Peds  Hematology negative hematology ROS (+)   Anesthesia Other Findings   Reproductive/Obstetrics                            Anesthesia Physical  Anesthesia Plan  ASA: III  Anesthesia Plan: General   Post-op Pain Management:    Induction: Intravenous  PONV Risk Score and Plan: 3 and Propofol infusion, Ondansetron and Midazolam  Airway Management Planned: Oral ETT  Additional Equipment: None  Intra-op Plan:   Post-operative Plan: Extubation in OR  Informed Consent: I have reviewed the patients History and Physical, chart, labs and discussed the procedure including the risks, benefits and alternatives for the proposed anesthesia with the patient or authorized representative who has indicated his/her understanding and acceptance.     Dental advisory given  Plan Discussed with: Anesthesiologist and CRNA  Anesthesia Plan Comments: (  )       Anesthesia Quick Evaluation

## 2020-05-11 NOTE — Discharge Instructions (Signed)
Scopolamine skin patches What is this medicine? SCOPOLAMINE (skoe POL a meen) is used to prevent nausea and vomiting caused by motion sickness, anesthesia and surgery. This medicine may be used for other purposes; ask your health care provider or pharmacist if you have questions. COMMON BRAND NAME(S): Transderm Scop What should I tell my health care provider before I take this medicine? They need to know if you have any of these conditions:  are scheduled to have a gastric secretion test  glaucoma  heart disease  kidney disease  liver disease  lung or breathing disease, like asthma  mental illness  prostate disease  seizures  stomach or intestine problems  trouble passing urine  an unusual or allergic reaction to scopolamine, atropine, other medicines, foods, dyes, or preservatives  pregnant or trying to get pregnant  breast-feeding How should I use this medicine? This medicine is for external use only. Follow the directions on the prescription label. Wear only 1 patch at a time. Choose an area behind the ear, that is clean, dry, hairless and free from any cuts or irritation. Wipe the area with a clean dry tissue. Peel off the plastic backing of the skin patch, trying not to touch the adhesive side with your hands. Do not cut the patches. Firmly apply to the area you have chosen, with the metallic side of the patch to the skin and the tan-colored side showing. Once firmly in place, wash your hands well with soap and water. Do not get this medicine into your eyes. After removing the patch, wash your hands and the area behind your ear thoroughly with soap and water. The patch will still contain some medicine after use. To avoid accidental contact or ingestion by children or pets, fold the used patch in half with the sticky side together and throw away in the trash out of the reach of children and pets. If you need to use a second patch after you remove the first, place it behind  the other ear. A special MedGuide will be given to you by the pharmacist with each prescription and refill. Be sure to read this information carefully each time. Talk to your pediatrician regarding the use of this medicine in children. Special care may be needed. Overdosage: If you think you have taken too much of this medicine contact a poison control center or emergency room at once. NOTE: This medicine is only for you. Do not share this medicine with others. What if I miss a dose? This does not apply. This medicine is not for regular use. What may interact with this medicine?  alcohol  antihistamines for allergy cough and cold  atropine  certain medicines for anxiety or sleep  certain medicines for bladder problems like oxybutynin, tolterodine  certain medicines for depression like amitriptyline, fluoxetine, sertraline  certain medicines for stomach problems like dicyclomine, hyoscyamine  certain medicines for Parkinson's disease like benztropine, trihexyphenidyl  certain medicines for seizures like phenobarbital, primidone  general anesthetics like halothane, isoflurane, methoxyflurane, propofol  ipratropium  local anesthetics like lidocaine, pramoxine, tetracaine  medicines that relax muscles for surgery  phenothiazines like chlorpromazine, mesoridazine, prochlorperazine, thioridazine  narcotic medicines for pain  other belladonna alkaloids This list may not describe all possible interactions. Give your health care provider a list of all the medicines, herbs, non-prescription drugs, or dietary supplements you use. Also tell them if you smoke, drink alcohol, or use illegal drugs. Some items may interact with your medicine. What should I watch for while using  this medicine? Limit contact with water while swimming and bathing because the patch may fall off. If the patch falls off, throw it away and put a new one behind the other ear. You may get drowsy or dizzy. Do not  drive, use machinery, or do anything that needs mental alertness until you know how this medicine affects you. Do not stand or sit up quickly, especially if you are an older patient. This reduces the risk of dizzy or fainting spells. Alcohol may interfere with the effect of this medicine. Avoid alcoholic drinks. Your mouth may get dry. Chewing sugarless gum or sucking hard candy, and drinking plenty of water may help. Contact your healthcare professional if the problem does not go away or is severe. This medicine may cause dry eyes and blurred vision. If you wear contact lenses, you may feel some discomfort. Lubricating drops may help. See your healthcare professional if the problem does not go away or is severe. If you are going to need surgery, an MRI, CT scan, or other procedure, tell your healthcare professional that you are using this medicine. You may need to remove the patch before the procedure. What side effects may I notice from receiving this medicine? Side effects that you should report to your doctor or health care professional as soon as possible:  allergic reactions like skin rash, itching or hives; swelling of the face, lips, or tongue  blurred vision  changes in vision  confusion  dizziness  eye pain  fast, irregular heartbeat  hallucinations, loss of contact with reality  nausea, vomiting  pain or trouble passing urine  restlessness  seizures  skin irritation  stomach pain Side effects that usually do not require medical attention (report to your doctor or health care professional if they continue or are bothersome):  drowsiness  dry mouth  headache  sore throat This list may not describe all possible side effects. Call your doctor for medical advice about side effects. You may report side effects to FDA at 1-800-FDA-1088. Where should I keep my medicine? Keep out of the reach of children. Store at room temperature between 20 and 25 degrees C (68 and 77  degrees F). Keep this medicine in the foil package until ready to use. Throw away any unused medicine after the expiration date. NOTE: This sheet is a summary. It may not cover all possible information. If you have questions about this medicine, talk to your doctor, pharmacist, or health care provider.  2021 Elsevier/Gold Standard (2017-04-18 16:14:46) General Anesthesia, Adult, Care After This sheet gives you information about how to care for yourself after your procedure. Your health care provider may also give you more specific instructions. If you have problems or questions, contact your health care provider. What can I expect after the procedure? After the procedure, the following side effects are common:  Pain or discomfort at the IV site.  Nausea.  Vomiting.  Sore throat.  Trouble concentrating.  Feeling cold or chills.  Feeling weak or tired.  Sleepiness and fatigue.  Soreness and body aches. These side effects can affect parts of the body that were not involved in surgery. Follow these instructions at home: For the time period you were told by your health care provider:  Rest.  Do not participate in activities where you could fall or become injured.  Do not drive or use machinery.  Do not drink alcohol.  Do not take sleeping pills or medicines that cause drowsiness.  Do not make important decisions  or sign legal documents.  Do not take care of children on your own.   Eating and drinking  Follow any instructions from your health care provider about eating or drinking restrictions.  When you feel hungry, start by eating small amounts of foods that are soft and easy to digest (bland), such as toast. Gradually return to your regular diet.  Drink enough fluid to keep your urine pale yellow.  If you vomit, rehydrate by drinking water, juice, or clear broth. General instructions  If you have sleep apnea, surgery and certain medicines can increase your risk for  breathing problems. Follow instructions from your health care provider about wearing your sleep device: ? Anytime you are sleeping, including during daytime naps. ? While taking prescription pain medicines, sleeping medicines, or medicines that make you drowsy.  Have a responsible adult stay with you for the time you are told. It is important to have someone help care for you until you are awake and alert.  Return to your normal activities as told by your health care provider. Ask your health care provider what activities are safe for you.  Take over-the-counter and prescription medicines only as told by your health care provider.  If you smoke, do not smoke without supervision.  Keep all follow-up visits as told by your health care provider. This is important. Contact a health care provider if:  You have nausea or vomiting that does not get better with medicine.  You cannot eat or drink without vomiting.  You have pain that does not get better with medicine.  You are unable to pass urine.  You develop a skin rash.  You have a fever.  You have redness around your IV site that gets worse. Get help right away if:  You have difficulty breathing.  You have chest pain.  You have blood in your urine or stool, or you vomit blood. Summary  After the procedure, it is common to have a sore throat or nausea. It is also common to feel tired.  Have a responsible adult stay with you for the time you are told. It is important to have someone help care for you until you are awake and alert.  When you feel hungry, start by eating small amounts of foods that are soft and easy to digest (bland), such as toast. Gradually return to your regular diet.  Drink enough fluid to keep your urine pale yellow.  Return to your normal activities as told by your health care provider. Ask your health care provider what activities are safe for you. This information is not intended to replace advice given  to you by your health care provider. Make sure you discuss any questions you have with your health care provider. Document Revised: 10/14/2019 Document Reviewed: 05/13/2019 Elsevier Patient Education  2021 [place] ______[employer] LAPAROSCOPIC SURGERY: POST OP INSTRUCTIONS Always review your discharge instruction sheet given to you by the facility where your surgery was performed. IF YOU HAVE DISABILITY OR FAMILY LEAVE FORMS, YOU MUST BRING THEM TO THE OFFICE FOR PROCESSING.   DO NOT GIVE THEM TO YOUR DOCTOR.  1. A prescription for pain medication may be given to you upon discharge.  Take your pain medication as prescribed, if needed.  If narcotic pain medicine is not needed, then you may take acetaminophen (Tylenol) or ibuprofen (Advil) as needed. 2. Take your usually prescribed medications unless otherwise directed. 3. If you need a refill on your pain medication, please contact  your pharmacy.  They will contact our office to request authorization. Prescriptions will not be filled after 5pm or on week-ends. 4. You should follow a light diet the first few days after arrival home, such as soup and crackers, etc.  Be sure to include lots of fluids daily. 5. Most patients will experience some swelling and bruising in the area of the incisions.  Ice packs will help.  Swelling and bruising can take several days to resolve.  6. It is common to experience some constipation if taking pain medication after surgery.  Increasing fluid intake and taking a stool softener (such as Colace) will usually help or prevent this problem from occurring.  A mild laxative (Milk of Magnesia or Miralax) should be taken according to package instructions if there are no bowel movements after 48 hours. 7. Unless discharge instructions indicate otherwise, you may remove your bandages 24-48 hours after surgery, and you may shower at that time.  You may have steri-strips (small skin tapes) in place  directly over the incision.  These strips should be left on the skin for 7-10 days.  If your surgeon used skin glue on the incision, you may shower in 24 hours.  The glue will flake off over the next 2-3 weeks.  Any sutures or staples will be removed at the office during your follow-up visit. 8. ACTIVITIES:  You may resume regular (light) daily activities beginning the next day--such as daily self-care, walking, climbing stairs--gradually increasing activities as tolerated.  You may have sexual intercourse when it is comfortable.  Refrain from any heavy lifting or straining until approved by your doctor. a. You may drive when you are no longer taking prescription pain medication, you can comfortably wear a seatbelt, and you can safely maneuver your car and apply brakes. b. RETURN TO WORK:  __________________________________________________________ 9. You should see your doctor in the office for a follow-up appointment approximately 2-3 weeks after your surgery.  Make sure that you call for this appointment within a day or two after you arrive home to insure a convenient appointment time. 10. OTHER INSTRUCTIONS:OK TO SHOWER STARTING TOMORROW 11. ICE PACK, TYLENOL, AND IBUPROFEN ALSO FOR PAIN 12. NO LIFTING MORE THAN 15 POUNDS FOR 2 WEEKS __________________________________________________________________________________________________________________________ __________________________________________________________________________________________________________________________ WHEN TO CALL YOUR DOCTOR: 1. Fever over 101.0 2. Inability to urinate 3. Continued bleeding from incision. 4. Increased pain, redness, or drainage from the incision. 5. Increasing abdominal pain  The clinic staff is available to answer your questions during regular business hours.  Please don't hesitate to call and ask to speak to one of the nurses for clinical concerns.  If you have a medical emergency, go to the nearest  emergency room or call 911.  A surgeon from [facility] is always on call at the hospital. [address] [place], [place], [place]  [address] ? [address], [place], [place]   [address] [phone] ? [phone] ? FAX [phone] Web site: [email or web address]

## 2020-05-11 NOTE — Interval H&P Note (Signed)
History and Physical Interval Note: no change in H and P  05/11/2020 2:13 PM  Sylvia Taylor  has presented today for surgery, with the diagnosis of chronic cholecystitis.  The various methods of treatment have been discussed with the patient and family. After consideration of risks, benefits and other options for treatment, the patient has consented to  Procedure(s): LAPAROSCOPIC CHOLECYSTECTOMY (N/A) LIVER BIOPSY (N/A) as a surgical intervention.  The patient's history has been reviewed, patient examined, no change in status, stable for surgery.  I have reviewed the patient's chart and labs.  Questions were answered to the patient's satisfaction.     Coralie Keens

## 2020-05-11 NOTE — Anesthesia Postprocedure Evaluation (Signed)
Anesthesia Post Note  Patient: Sylvia Taylor  Procedure(s) Performed: LAPAROSCOPIC CHOLECYSTECTOMY (N/A Abdomen) LIVER BIOPSY (N/A Abdomen)     Patient location during evaluation: PACU Anesthesia Type: General Level of consciousness: awake and alert Pain management: pain level controlled Vital Signs Assessment: post-procedure vital signs reviewed and stable Respiratory status: spontaneous breathing, nonlabored ventilation, respiratory function stable and patient connected to nasal cannula oxygen Cardiovascular status: blood pressure returned to baseline and stable Postop Assessment: no apparent nausea or vomiting Anesthetic complications: no   No complications documented.  Last Vitals:  Vitals:   05/11/20 1815 05/11/20 1830  BP: (!) 151/93 139/89  Pulse: 91 91  Resp: 14 13  Temp:    SpO2: 98% 96%    Last Pain:  Vitals:   05/11/20 1815  TempSrc:   PainSc: 7                  Hailynn Slovacek COKER

## 2020-05-12 ENCOUNTER — Encounter (HOSPITAL_COMMUNITY): Payer: Self-pay | Admitting: Surgery

## 2020-05-15 LAB — SURGICAL PATHOLOGY

## 2020-06-15 ENCOUNTER — Ambulatory Visit: Payer: BC Managed Care – PPO | Admitting: Nurse Practitioner

## 2020-06-15 ENCOUNTER — Other Ambulatory Visit: Payer: Self-pay

## 2020-06-15 ENCOUNTER — Encounter: Payer: Self-pay | Admitting: Nurse Practitioner

## 2020-06-15 VITALS — BP 119/79 | HR 90 | Temp 98.6°F | Resp 20 | Ht 65.0 in | Wt 358.0 lb

## 2020-06-15 DIAGNOSIS — R002 Palpitations: Secondary | ICD-10-CM | POA: Diagnosis not present

## 2020-06-15 NOTE — Patient Instructions (Signed)
Palpitations Palpitations are feelings that your heartbeat is not normal. Your heartbeat may feel like it is:  Uneven.  Faster than normal.  Fluttering.  Skipping a beat. This is usually not a serious problem. In some cases, you may need tests to rule out any serious problems. Follow these instructions at home: Pay attention to any changes in your condition. Take these actions to help manage your symptoms: Eating and drinking  Avoid: ? Coffee, tea, soft drinks, and energy drinks. ? Chocolate. ? Alcohol. ? Diet pills. Lifestyle  Try to lower your stress. These things can help you relax: ? Yoga. ? Deep breathing and meditation. ? Exercise. ? Using words and images to create positive thoughts (guided imagery). ? Using your mind to control things in your body (biofeedback).  Do not use drugs.  Get plenty of rest and sleep. Keep a regular bed time.   General instructions  Take over-the-counter and prescription medicines only as told by your doctor.  Do not use any products that contain nicotine or tobacco, such as cigarettes and e-cigarettes. If you need help quitting, ask your doctor.  Keep all follow-up visits as told by your doctor. This is important. You may need more tests if palpitations do not go away or get worse.   Contact a doctor if:  Your symptoms last more than 24 hours.  Your symptoms occur more often. Get help right away if you:  Have chest pain.  Feel short of breath.  Have a very bad headache.  Feel dizzy.  Pass out (faint). Summary  Palpitations are feelings that your heartbeat is uneven or faster than normal. It may feel like your heart is fluttering or skipping a beat.  Avoid food and drinks that may cause palpitations. These include caffeine, chocolate, and alcohol.  Try to lower your stress. Do not smoke or use drugs.  Get help right away if you faint or have chest pain, shortness of breath, a severe headache, or dizziness. This  information is not intended to replace advice given to you by your health care provider. Make sure you discuss any questions you have with your health care provider. Document Revised: 03/12/2017 Document Reviewed: 03/12/2017 Elsevier Patient Education  2021 Elsevier Inc.  

## 2020-06-15 NOTE — Progress Notes (Signed)
   Subjective:    Patient ID: Sylvia Taylor, female    DOB: 08/15/1970, 50 y.o.   MRN: [id number]   Chief Complaint: Recheck thryoid (Feels like heart is beating fast/)   HPI Patient comes in wanting her thyroid rechecked. She is currenlty on levothyroxine 75 mcg daily.  Lab Results  Component Value Date   TSH 4.320 05/21/2019   She says that she has had episodes where her heart feels like it is beating hard and fast . It can occur when she is just seating and resting. Her apple watch is not showing that heart rats is over 100 and showing NSR.  Review of Systems  Constitutional: Negative for diaphoresis.  Eyes: Negative for pain.  Respiratory: Negative for shortness of breath.   Cardiovascular: Negative for chest pain, palpitations and leg swelling.  Gastrointestinal: Negative for abdominal pain.  Endocrine: Negative for polydipsia.  Skin: Negative for rash.  Neurological: Negative for dizziness, weakness and headaches.  Hematological: Does not bruise/bleed easily.  All other systems reviewed and are negative.      Objective:   Physical Exam Vitals and nursing note reviewed.  Constitutional:      Appearance: Normal appearance.  Cardiovascular:     Rate and Rhythm: Normal rate and regular rhythm.     Heart sounds: Normal heart sounds.  Pulmonary:     Effort: Pulmonary effort is normal.     Breath sounds: Normal breath sounds.  Skin:    General: Skin is warm and dry.  Neurological:     General: No focal deficit present.     Mental Status: She is alert and oriented to person, place, and time.  Psychiatric:        Mood and Affect: Mood normal.        Behavior: Behavior normal.     BP 119/79   Pulse 90   Temp 98.6 F (37 C) (Temporal)   Resp 20   Ht 5\' 5"  (1.651 m)   Wt (!) 358 lb (162.4 kg)   LMP 09/13/2015   SpO2 97%   BMI 59.57 kg/m       Assessment & Plan:  Sylvia Taylor in today with chief complaint of Recheck thryoid (Feels like heart is beating  fast/)   1. Palpitations Avoid caffeine Keep diary of episodes- how long last, what doing and check watch to se what heart rate is. RTO if continues and will do cardiac referral for monitor.    The above assessment and management plan was discussed with the patient. The patient verbalized understanding of and has agreed to the management plan. Patient is aware to call the clinic if symptoms persist or worsen. Patient is aware when to return to the clinic for a follow-up visit. Patient educated on when it is appropriate to go to the emergency department.   Mary-Margaret Hassell Done, FNP

## 2020-06-16 LAB — THYROID PANEL WITH TSH
Free Thyroxine Index: 2.9 (ref 1.2–4.9)
T3 Uptake Ratio: 27 % (ref 24–39)
T4, Total: 10.6 ug/dL (ref 4.5–12.0)
TSH: 4.47 u[IU]/mL (ref 0.450–4.500)

## 2020-06-18 ENCOUNTER — Other Ambulatory Visit: Payer: Self-pay | Admitting: Nurse Practitioner

## 2020-07-13 ENCOUNTER — Telehealth: Payer: Self-pay | Admitting: Nurse Practitioner

## 2020-07-13 DIAGNOSIS — R002 Palpitations: Secondary | ICD-10-CM

## 2020-07-13 NOTE — Telephone Encounter (Signed)
Left detailed message.   

## 2020-07-13 NOTE — Telephone Encounter (Signed)
Referral made 

## 2020-07-14 ENCOUNTER — Emergency Department (HOSPITAL_COMMUNITY): Payer: BC Managed Care – PPO

## 2020-07-14 ENCOUNTER — Emergency Department (HOSPITAL_COMMUNITY)
Admission: EM | Admit: 2020-07-14 | Discharge: 2020-07-14 | Disposition: A | Discharge status: Home / Self Care | Payer: BC Managed Care – PPO | Attending: Emergency Medicine | Admitting: Emergency Medicine

## 2020-07-14 ENCOUNTER — Other Ambulatory Visit: Payer: Self-pay

## 2020-07-14 ENCOUNTER — Encounter (HOSPITAL_COMMUNITY): Payer: Self-pay | Admitting: Pharmacy Technician

## 2020-07-14 DIAGNOSIS — R002 Palpitations: Secondary | ICD-10-CM | POA: Insufficient documentation

## 2020-07-14 DIAGNOSIS — M549 Dorsalgia, unspecified: Secondary | ICD-10-CM | POA: Diagnosis not present

## 2020-07-14 DIAGNOSIS — R1012 Left upper quadrant pain: Secondary | ICD-10-CM

## 2020-07-14 DIAGNOSIS — Z79899 Other long term (current) drug therapy: Secondary | ICD-10-CM | POA: Diagnosis not present

## 2020-07-14 DIAGNOSIS — E039 Hypothyroidism, unspecified: Secondary | ICD-10-CM | POA: Insufficient documentation

## 2020-07-14 LAB — CBC WITH DIFFERENTIAL/PLATELET
Abs Immature Granulocytes: 0.02 10*3/uL (ref 0.00–0.07)
Basophils Absolute: 0 10*3/uL (ref 0.0–0.1)
Basophils Relative: 0 %
Eosinophils Absolute: 0.1 10*3/uL (ref 0.0–0.5)
Eosinophils Relative: 1 %
HCT: 40.7 % (ref 36.0–46.0)
Hemoglobin: 13 g/dL (ref 12.0–15.0)
Immature Granulocytes: 0 %
Lymphocytes Relative: 25 %
Lymphs Abs: 2.4 10*3/uL (ref 0.7–4.0)
MCH: 27.3 pg (ref 26.0–34.0)
MCHC: 31.9 g/dL (ref 30.0–36.0)
MCV: 85.3 fL (ref 80.0–100.0)
Monocytes Absolute: 0.5 10*3/uL (ref 0.1–1.0)
Monocytes Relative: 6 %
Neutro Abs: 6.7 10*3/uL (ref 1.7–7.7)
Neutrophils Relative %: 68 %
Platelets: 219 10*3/uL (ref 150–400)
RBC: 4.77 MIL/uL (ref 3.87–5.11)
RDW: 13.5 % (ref 11.5–15.5)
WBC: 9.8 10*3/uL (ref 4.0–10.5)
nRBC: 0 % (ref 0.0–0.2)

## 2020-07-14 LAB — COMPREHENSIVE METABOLIC PANEL
ALT: 36 U/L (ref 0–44)
AST: 21 U/L (ref 15–41)
Albumin: 3.8 g/dL (ref 3.5–5.0)
Alkaline Phosphatase: 81 U/L (ref 38–126)
Anion gap: 9 (ref 5–15)
BUN: 8 mg/dL (ref 6–20)
CO2: 27 mmol/L (ref 22–32)
Calcium: 9.5 mg/dL (ref 8.9–10.3)
Chloride: 103 mmol/L (ref 98–111)
Creatinine, Ser: 0.63 mg/dL (ref 0.44–1.00)
GFR, Estimated: >60 mL/min (ref >60)
Glucose, Bld: 81 mg/dL (ref 70–99)
Potassium: 3.9 mmol/L (ref 3.5–5.1)
Sodium: 139 mmol/L (ref 135–145)
Total Bilirubin: 0.9 mg/dL (ref 0.3–1.2)
Total Protein: 7.1 g/dL (ref 6.5–8.1)

## 2020-07-14 LAB — TROPONIN I (HIGH SENSITIVITY)
Troponin I (High Sensitivity): 3 ng/L (ref <18)
Troponin I (High Sensitivity): 4 ng/L (ref <18)

## 2020-07-14 LAB — LIPASE, BLOOD: Lipase: 22 U/L (ref 11–51)

## 2020-07-14 NOTE — ED Provider Notes (Signed)
[facility] EMERGENCY DEPARTMENT Provider Note   CSN: [id number] Arrival date & time: 07/14/20  1421     History Chief Complaint  Patient presents with  . Chest Pain  . Palpitations    Sylvia Taylor is a 50 y.o. female.  HPI   patient presents with chest pain abdominal pain.  Has had for the last few weeks.  It is in her left upper abdomen and does go to the back.  Worse with certain positions but worse with pressing on there.  May be worse with food.  Has had cholecystectomy around 3 months ago.  No shortness of breath.  In between the episodes of pain patient feels better.  Patient feels palpitations at x2 may or may not be associate with the pain.  Only last few seconds.     Past Medical History:  Diagnosis Date  . Allergy   . GERD (gastroesophageal reflux disease)   . Hypothyroidism   . PONV (postoperative nausea and vomiting)   . Thyroid disease     Patient Active Problem List   Diagnosis Date Noted  . Severe obesity (BMI >= 40) ([place]) 06/06/2014  . Fluid retention in legs 06/02/2013  . Hypothyroidism 11/03/2012  . Allergic rhinitis 11/03/2012    Past Surgical History:  Procedure Laterality Date  . BREAST BIOPSY Left 05/27/2013  . CESAREAN SECTION    . CHOLECYSTECTOMY N/A 05/11/2020   Procedure: LAPAROSCOPIC CHOLECYSTECTOMY;  Surgeon: Coralie Keens, MD;  Location: [facility];  Service: General;  Laterality: N/A;  . COLONOSCOPY WITH PROPOFOL N/A 03/24/2020   Procedure: COLONOSCOPY WITH PROPOFOL;  Surgeon: Carol Ada, MD;  Location: [facility];  Service: Endoscopy;  Laterality: N/A;  . KNEE ARTHROSCOPY Right 09/2014  . LIVER BIOPSY N/A 05/11/2020   Procedure: LIVER BIOPSY;  Surgeon: Coralie Keens, MD;  Location: [facility];  Service: General;  Laterality: N/A;  . POLYPECTOMY  03/24/2020   Procedure: POLYPECTOMY;  Surgeon: Carol Ada, MD;  Location: [facility];  Service: Endoscopy;;     OB History   No obstetric history on file.      Family History  Problem Relation Age of Onset  . Hypertension Father   . Sleep apnea Father   . Cancer Maternal Grandmother        ovarian and thyroid cancer    Social History   Tobacco Use  . Smoking status: Never Smoker  . Smokeless tobacco: Never Used  Vaping Use  . Vaping Use: Never used  Substance Use Topics  . Alcohol use: No  . Drug use: No    Home Medications Prior to Admission medications   Medication Sig Start Date End Date Taking? Authorizing Provider  cetirizine (ZYRTEC) 10 MG tablet Take 1 tablet (10 mg total) by mouth daily. Patient taking differently: Take 10 mg by mouth daily as needed for allergies. 05/05/15   Hassell Done, Mary-Margaret, FNP  fluticasone (FLONASE) 50 MCG/ACT nasal spray Place 2 sprays into both nostrils daily. Patient taking differently: Place 2 sprays into both nostrils daily as needed for allergies. 11/07/15   Hassell Done Mary-Margaret, FNP  levothyroxine (SYNTHROID) 75 MCG tablet TAKE 1 TABLET BY MOUTH EVERY DAY 06/19/20   Hassell Done, Mary-Margaret, FNP  omeprazole (PRILOSEC) 20 MG capsule Take 1 capsule (20 mg total) by mouth 2 (two) times daily before a meal. Patient not taking: Reported on 06/15/2020 10/11/19   Evelina Dun A, FNP  Probiotic Product (PROBIOTIC-10 PO) Take 1 capsule by mouth daily.    [provider]  sucralfate (CARAFATE) 1 g tablet Take 1 tablet (1 g total) by mouth 4 (four) times daily -  with meals and at bedtime. Patient not taking: Reported on 06/15/2020 10/12/19   Sharion Balloon, FNP    Allergies    Patient has no known allergies.  Review of Systems   Review of Systems  Constitutional: Negative for appetite change, fatigue and fever.  HENT: Negative for congestion.   Respiratory: Negative for shortness of breath.   Cardiovascular: Positive for chest pain. Negative for leg swelling.  Gastrointestinal: Positive for abdominal pain. Negative for nausea.  Genitourinary: Negative for flank pain, pelvic pain and vaginal  pain.  Musculoskeletal: Negative for back pain.  Skin: Negative for rash.  Neurological: Negative for weakness.  Psychiatric/Behavioral: Negative for confusion.    Physical Exam Updated Vital Signs BP 136/85   Pulse 69   Temp 98.7 F (37.1 C) (Oral)   Resp 16   LMP 09/13/2015   SpO2 99%   Physical Exam Vitals and nursing note reviewed.  Constitutional:      Appearance: She is obese.  HENT:     Head: Atraumatic.  Cardiovascular:     Rate and Rhythm: Normal rate and regular rhythm.  Pulmonary:     Breath sounds: No wheezing or rhonchi.  Chest:     Chest wall: No tenderness.     Comments: No tenderness over chest wall or CVA area. Abdominal:     Tenderness: There is abdominal tenderness.     Comments: Tenderness to left upper abdomen.  No rebound or guarding.  No hernia palpated.  No ecchymosis.  Musculoskeletal:     Right lower leg: No edema.     Left lower leg: No edema.  Skin:    General: Skin is warm.     Capillary Refill: Capillary refill takes less than 2 seconds.  Neurological:     Mental Status: She is alert and oriented to person, place, and time.     ED Results / Procedures / Treatments   Labs (all labs ordered are listed, but only abnormal results are displayed) Labs Reviewed  COMPREHENSIVE METABOLIC PANEL  CBC WITH DIFFERENTIAL/PLATELET  LIPASE, BLOOD  TROPONIN I (HIGH SENSITIVITY)  TROPONIN I (HIGH SENSITIVITY)    EKG EKG Interpretation  Date/Time:  Friday July 14 2020 14:58:33 EDT Ventricular Rate:  85 PR Interval:  160 QRS Duration: 76 QT Interval:  376 QTC Calculation: 447 R Axis:   29 Text Interpretation: Normal sinus rhythm Nonspecific T wave abnormality Abnormal ECG No old tracing to compare Confirmed by Davonna Belling [id number]) on 07/14/2020 9:03:58 PM   Radiology DG Chest 2 View  Result Date: 07/14/2020 CLINICAL DATA:  Chest pain and palpitations. EXAM: CHEST - 2 VIEW COMPARISON:  Report of chest x-ray 03/06/2017 reviewed. Images  are not available. FINDINGS: Lungs clear. Heart size is upper normal. No pneumothorax or pleural fluid. No acute or focal bony abnormality. IMPRESSION: No acute disease. Electronically Signed   By: Inge Rise M.D.   On: 07/14/2020 15:33    Procedures Procedures   Medications Ordered in ED Medications - No data to display  ED Course  I have reviewed the triage vital signs and the nursing notes.  Pertinent labs & imaging results that were available during my care of the patient were reviewed by me and considered in my medical decision making (see chart for details).    MDM Rules/Calculators/A&P  Patient with chest pain/abdominal pain.  It appears to be in the left upper abdomen where the tenderness is and goes to the back.  Has had recent cholecystectomy.  Lipase done and normal.  Doubt cardiac cause.  EKG reassuring.  Reviewed monitoring and no clear explanation for the palpitations she was feeling.  Doubt pulmonary embolism.  No swelling in her legs and the symptoms will completely resolve at times.  There is some reproducible pain.  No rash.  Will discharge home with outpatient follow-up with PCP as needed Final Clinical Impression(s) / ED Diagnoses Final diagnoses:  Palpitations  Left upper quadrant abdominal pain    Rx / DC Orders ED Discharge Orders    None       Davonna Belling, MD 07/14/20 2338

## 2020-07-14 NOTE — ED Notes (Signed)
Pt discharged and ambulated out of the ED without difficulty. 

## 2020-07-14 NOTE — ED Provider Notes (Signed)
Emergency Medicine Provider Triage Evaluation Note  Sylvia Taylor , a 50 y.o. female  was evaluated in triage.  Pt complains of chest pain, shortness of breath.  Reports having nausea as well.  No leg swelling that she is aware of.  Sent to the ER by PCP.  No history of MI or PE in the past.  Review of Systems  Positive: Chest pain, shortness of breath Negative: Leg swelling  Physical Exam  BP (!) 159/86 (BP Location: Left Arm)   Pulse 89   Temp 98.3 F (36.8 C) (Oral)   Resp 20   LMP 09/13/2015   SpO2 100%  Gen:   Awake, no distress Resp:  Normal effort MSK:   Moves extremities without difficulty Other:  No pitting edema noted  Medical Decision Making  Medically screening exam initiated at 3:02 PM.  Appropriate orders placed.  Sylvia Taylor was informed that the remainder of the evaluation will be completed by another provider, this initial triage assessment does not replace that evaluation, and the importance of remaining in the ED until their evaluation is complete.  Lab work and imaging ordered and Sylvia Pacini, PA-C 07/14/20 1503    Sylvia Boss, MD 07/17/20 1414

## 2020-07-14 NOTE — ED Triage Notes (Signed)
Pt here with reports of central chest pain, L sided rib pain and palpitations. Pt tearful in triage. States the pain is worse today.

## 2020-07-18 DIAGNOSIS — M792 Neuralgia and neuritis, unspecified: Secondary | ICD-10-CM | POA: Insufficient documentation

## 2020-07-18 DIAGNOSIS — M25512 Pain in left shoulder: Secondary | ICD-10-CM | POA: Insufficient documentation

## 2020-09-06 ENCOUNTER — Encounter: Payer: Self-pay | Admitting: Cardiology

## 2020-09-06 NOTE — Progress Notes (Signed)
Cardiology Office Note  Date: 09/07/2020   ID: Sylvia Taylor, DOB 13-Oct-1970, MRN [id number]  PCP:  Chevis Pretty, FNP  Cardiologist:  Rozann Lesches, MD Electrophysiologist:  None   Chief Complaint  Patient presents with   Palpitations     History of Present Illness: Sylvia Taylor is a 50 y.o. female referred for cardiology consultation by Ms. Hassell Done NP for the evaluation of palpitations.  She describes a sense of both forceful heartbeat at times and also a brief sensation like she is on a roller coaster.  There has been no obvious trigger for the symptoms, she has had no associated syncope.  Otherwise no exertional symptomatology.  I reviewed her recent ECG from June as noted below.  Medications are also listed below, her most recent TSH was normal in May.  She does not have any history of cardiac arrhythmia.  Past Medical History:  Diagnosis Date   Allergy    GERD (gastroesophageal reflux disease)    Hypothyroidism     Past Surgical History:  Procedure Laterality Date   BREAST BIOPSY Left 05/27/2013   CESAREAN SECTION     CHOLECYSTECTOMY N/A 05/11/2020   Procedure: LAPAROSCOPIC CHOLECYSTECTOMY;  Surgeon: Coralie Keens, MD;  Location: [facility];  Service: General;  Laterality: N/A;   COLONOSCOPY WITH PROPOFOL N/A 03/24/2020   Procedure: COLONOSCOPY WITH PROPOFOL;  Surgeon: Taylor Ada, MD;  [facility];  Service: Endoscopy;  Laterality: N/A;   KNEE ARTHROSCOPY Right 09/2014   LIVER BIOPSY N/A 05/11/2020   Procedure: LIVER BIOPSY;  Surgeon: Coralie Keens, MD;  Location: [facility];  Service: General;  Laterality: N/A;   POLYPECTOMY  03/24/2020   Procedure: POLYPECTOMY;  Surgeon: Taylor Ada, MD;  Location: [facility];  Service: Endoscopy;;    Current Outpatient Medications  Medication Sig Dispense Refill   cetirizine (ZYRTEC) 10 MG tablet Take 10 mg by mouth as needed for allergies.     fluticasone (FLONASE) 50 MCG/ACT nasal spray Place 1 spray  into both nostrils as needed for allergies or rhinitis.     levothyroxine (SYNTHROID) 75 MCG tablet TAKE 1 TABLET BY MOUTH EVERY DAY 90 tablet 3   omeprazole (PRILOSEC) 40 MG capsule Take 40 mg by mouth daily.     Probiotic Product (PROBIOTIC-10 PO) Take 1 capsule by mouth daily.     No current facility-administered medications for this visit.   Allergies:  Patient has no known allergies.   Social History: The patient  reports that she has never smoked. She has never used smokeless tobacco. She reports that she does not drink alcohol and does not use drugs.   Family History: The patient's family history includes Cancer in her maternal grandmother; Hypertension in her father; Sleep apnea in her father.   ROS: No orthopnea or PND.  No syncope.  Physical Exam: VS:  BP 128/82   Pulse 82   Ht '5\' 5"'$  (1.651 m)   Wt (!) 349 lb 9.6 oz (158.6 kg)   LMP 09/13/2015   SpO2 96%   BMI 58.18 kg/m , BMI Body mass index is 58.18 kg/m.  Wt Readings from Last 3 Encounters:  09/07/20 (!) 349 lb 9.6 oz (158.6 kg)  06/15/20 (!) 358 lb (162.4 kg)  05/11/20 (!) 370 lb (167.8 kg)    General: Patient appears comfortable at rest. HEENT: Conjunctiva and lids normal, wearing a mask. Neck: Supple, no elevated JVP or carotid bruits, no thyromegaly. Lungs: Clear to auscultation, nonlabored breathing at rest. Cardiac: Regular rate  and rhythm, no S3 or significant systolic murmur, no pericardial rub. Abdomen: Bowel sounds present. Extremities: No pitting edema. Skin: Warm and dry. Musculoskeletal: No kyphosis. Neuropsychiatric: Alert and oriented x3, affect grossly appropriate.  ECG:  An ECG dated 07/14/2020 was personally reviewed today and demonstrated:  Sinus rhythm with nonspecific T wave changes.  Recent Labwork: 06/15/2020: TSH 4.470 07/14/2020: ALT 36; AST 21; BUN 8; Creatinine, Ser 0.63; Hemoglobin 13.0; Platelets 219; Potassium 3.9; Sodium 139     Component Value Date/Time   CHOL 164 03/19/2019 1545    TRIG 91 03/19/2019 1545   TRIG 140 06/06/2014 1550   HDL 44 03/19/2019 1545   HDL 43 06/06/2014 1550   CHOLHDL 3.7 03/19/2019 1545   LDLCALC 103 (H) 03/19/2019 1545   LDLCALC 97 12/03/2013 1432    Other Studies Reviewed Today:  Chest x-ray 07/14/2020: FINDINGS: Lungs clear. Heart size is upper normal. No pneumothorax or pleural fluid. No acute or focal bony abnormality.   IMPRESSION: No acute disease.  Assessment and Plan:  1.  Palpitations as outlined above.  Could potentially be PVCs based on description.  Her ECG from June shows sinus rhythm with nonspecific T wave changes.  She does have hypothyroidism on Synthroid, but TSH was normal in May.  Plan to obtain a 7-day Zio patch for further investigation of cardiac rhythm.  2.  Hypothyroidism, on Synthroid.  TSH 4.47 in May.  Medication Adjustments/Labs and Tests Ordered: Current medicines are reviewed at length with the patient today.  Concerns regarding medicines are outlined above.   Tests Ordered: No orders of the defined types were placed in this encounter.   Medication Changes: No orders of the defined types were placed in this encounter.   Disposition:  Follow up  test results.  Signed, Satira Sark, MD, [facility] 09/07/2020 11:05 AM    [facility] [employer], [place], [place] [address] Phone: [phone]; Fax: [phone]

## 2020-09-07 ENCOUNTER — Ambulatory Visit: Payer: BC Managed Care – PPO | Admitting: Cardiology

## 2020-09-07 ENCOUNTER — Ambulatory Visit (INDEPENDENT_AMBULATORY_CARE_PROVIDER_SITE_OTHER): Payer: BC Managed Care – PPO

## 2020-09-07 ENCOUNTER — Other Ambulatory Visit: Payer: Self-pay | Admitting: Cardiology

## 2020-09-07 ENCOUNTER — Other Ambulatory Visit: Payer: Self-pay

## 2020-09-07 ENCOUNTER — Encounter: Payer: Self-pay | Admitting: Cardiology

## 2020-09-07 ENCOUNTER — Telehealth: Payer: Self-pay | Admitting: Cardiology

## 2020-09-07 VITALS — BP 128/82 | HR 82 | Ht 65.0 in | Wt 349.6 lb

## 2020-09-07 DIAGNOSIS — R002 Palpitations: Secondary | ICD-10-CM

## 2020-09-07 DIAGNOSIS — E039 Hypothyroidism, unspecified: Secondary | ICD-10-CM

## 2020-09-07 NOTE — Telephone Encounter (Signed)
Pre-cert Verification for the following procedure     7 Day ZIO XT dx: palpitations

## 2020-09-07 NOTE — Patient Instructions (Addendum)
Medication Instructions:  Your physician recommends that you continue on your current medications as directed. Please refer to the Current Medication list given to you today.  Labwork: none  Testing/Procedures: ZIO- Long Term Monitor Instructions   Your physician has requested you wear your ZIO patch monitor 7 days.   This is a single patch monitor.  [employer] supplies one patch monitor per enrollment.  Additional stickers are not available.   Please do not apply patch if you will be having a Nuclear Stress Test, Echocardiogram, Cardiac CT, MRI, or Chest Xray during the time frame you would be wearing the monitor. The patch cannot be worn during these tests.  You cannot remove and re-apply the ZIO XT patch monitor.     Once you have received you monitor, please review enclosed instructions.  Your monitor has already been registered assigning a specific monitor serial # to you.   Applying the monitor   Shave hair from upper left chest.   Hold abrader disc by orange tab.  Rub abrader in 40 strokes over left upper chest as indicated in your monitor instructions.   Clean area with 4 enclosed alcohol pads .  Use all pads to assure are is cleaned thoroughly.  Let dry.   Apply patch as indicated in monitor instructions.  Patch will be place under collarbone on left side of chest with arrow pointing upward.   Rub patch adhesive wings for 2 minutes.Remove white label marked "1".  Remove white label marked "2".  Rub patch adhesive wings for 2 additional minutes.   While looking in a mirror, press and release button in center of patch.  A small green light will flash 3-4 times .  This will be your only indicator the monitor has been turned on.     Do not shower for the first 24 hours.  You may shower after the first 24 hours.   Press button if you feel a symptom. You will hear a small click.  Record Date, Time and Symptom in the Patient Log Book.   When you are ready to remove patch, follow  instructions on last 2 pages of Patient Log Book.  Stick patch monitor onto last page of Patient Log Book.   Place Patient Log Book in [place] box.  Use locking tab on box and tape box closed securely.  The [employer] and [employer] has [employer] on it.  Please place in mailbox as soon as possible.  Your physician should have your test results approximately 7 days after the monitor has been mailed back to [facility]   Call [place] [facility] at [phone] if you have questions regarding your ZIO XT patch monitor.  Call them immediately if you see an orange light blinking on your monitor.   If your monitor falls off in less than 4 days contact our Monitor department at [phone].  If your monitor becomes loose or falls off after 4 days call [employer] at [phone] for suggestions on securing your monitor.  Follow-Up: Your physician recommends that you schedule a follow-up appointment in: pending  Any Other Special Instructions Will Be Listed Below (If Applicable).  If you need a refill on your cardiac medications before your next appointment, please call your pharmacy.

## 2020-09-25 ENCOUNTER — Other Ambulatory Visit: Payer: Self-pay | Admitting: Gastroenterology

## 2020-09-25 DIAGNOSIS — R1032 Left lower quadrant pain: Secondary | ICD-10-CM

## 2020-09-27 ENCOUNTER — Telehealth: Payer: Self-pay | Admitting: *Deleted

## 2020-09-27 NOTE — Telephone Encounter (Signed)
Patient informed. Copy sent to PCP °

## 2020-09-27 NOTE — Telephone Encounter (Signed)
-----   Message from Satira Sark, MD sent at 09/24/2020  1:17 PM EDT ----- Results reviewed.  Cardiac monitor shows overall normal sinus rhythm with rare PACs and PVCs as well as a few brief episodes of PSVT, none of which were sustained.  She could potentially be feeling this rare ectopy as a sense of palpitations, but these are not dangerous to her.  Basic lifestyle modification to improve this would include regular exercise, weight loss and avoiding stimulants such as caffeine.  Medical therapy would not necessarily be indicated unless symptoms are overly bothersome and do not improve.  At that point a low-dose beta-blocker could be considered.  Would keep follow-up with PCP unless the symptoms progress.

## 2020-10-02 ENCOUNTER — Other Ambulatory Visit: Payer: Self-pay | Admitting: Orthopaedic Surgery

## 2020-10-02 DIAGNOSIS — M47894 Other spondylosis, thoracic region: Secondary | ICD-10-CM

## 2020-10-03 ENCOUNTER — Other Ambulatory Visit: Payer: Self-pay

## 2020-10-03 ENCOUNTER — Encounter: Payer: Self-pay | Admitting: Physical Therapy

## 2020-10-03 ENCOUNTER — Ambulatory Visit: Payer: BC Managed Care – PPO | Attending: Orthopaedic Surgery | Admitting: Physical Therapy

## 2020-10-03 DIAGNOSIS — M545 Low back pain, unspecified: Secondary | ICD-10-CM | POA: Insufficient documentation

## 2020-10-03 DIAGNOSIS — G8929 Other chronic pain: Secondary | ICD-10-CM

## 2020-10-03 NOTE — Therapy (Signed)
[facility] [place], [place], [address] Phone: [phone]   Fax:  [phone]  Physical Therapy Evaluation  Patient Details  Name: SHANTIA FUNDERBURKE MRN: [id number] Date of Birth: 01-04-1971 Referring Provider (PT): Ignacia Bayley PA-C   Encounter Date: 10/03/2020   PT End of Session - 10/03/20 1640     Visit Number 1    Number of Visits 12    Date for PT Re-Evaluation 11/14/20    PT Start Time 0318    PT Stop Time 0409    PT Time Calculation (min) 51 min    Activity Tolerance Patient tolerated treatment well    Behavior During Therapy [facility] for tasks assessed/performed             Past Medical History:  Diagnosis Date   Allergy    GERD (gastroesophageal reflux disease)    Hypothyroidism     Past Surgical History:  Procedure Laterality Date   BREAST BIOPSY Left 05/27/2013   CESAREAN SECTION     CHOLECYSTECTOMY N/A 05/11/2020   Procedure: LAPAROSCOPIC CHOLECYSTECTOMY;  Surgeon: Coralie Keens, MD;  Location: [place];  Service: General;  Laterality: N/A;   COLONOSCOPY WITH PROPOFOL N/A 03/24/2020   Procedure: COLONOSCOPY WITH PROPOFOL;  Surgeon: Carol Ada, MD;  [facility];  Service: Endoscopy;  Laterality: N/A;   KNEE ARTHROSCOPY Right 09/2014   LIVER BIOPSY N/A 05/11/2020   Procedure: LIVER BIOPSY;  Surgeon: Coralie Keens, MD;  Location: [place];  Service: General;  Laterality: N/A;   POLYPECTOMY  03/24/2020   Procedure: POLYPECTOMY;  Surgeon: Carol Ada, MD;  [facility];  Service: Endoscopy;;    There were no vitals filed for this visit.    Subjective Assessment - 10/03/20 1642     Subjective COVID-19 screen performed prior to patient entering clinic.  The patient presents to the clinic today today with c/o chronic low back pain for about a year.  Her pain is worse on the left side.  Her pain is rated at a 5/10 today and can increase to higher levels with standing in  excess of 30 minutes.  She states sometimes heat and sitting decreases her pain.  She relates no specific even or injury.  She reports her sleep is also disturbed due to pain.    Pertinent History DM, hypothyroidism, right knee scope.                [facility] PT Assessment - 10/03/20 0001       Assessment   Medical Diagnosis Lumbar spondylosis    Referring Provider (PT) Ignacia Bayley PA-C    Onset Date/Surgical Date --   ~one year.     Precautions   Precautions None      Restrictions   Weight Bearing Restrictions No      Balance Screen   Has the patient fallen in the past 6 months No    Has the patient had a decrease in activity level because of a fear of falling?  No    Is the patient reluctant to leave their home because of a fear of falling?  No      Home [employer] residence      Prior Function   Level of Independence Independent      Posture/Postural Control   Posture/Postural Control Postural limitations    Postural Limitations Increased lumbar lordosis      Deep Tendon Reflexes   DTR Assessment Site Patella;Achilles  Patella DTR 2+    Achilles DTR 2+      ROM / Strength   AROM / PROM / Strength AROM;Strength      AROM   Overall AROM Comments Full active lumbar flexion and extension.  Flexion felt good and extension increased pain.      Strength   Overall Strength Comments Normal LE strength.      Palpation   Palpation comment Tender to palpation over left SIJ region and upper gluteal region.      Special Tests   Other special tests Equal leg lengths.  Negative SLR testing.      Ambulation/Gait   Gait Comments WNL.                        Objective measurements completed on examination: See above findings.       [facility] Adult PT Treatment/Exercise - 10/03/20 0001       Modalities   Modalities Electrical Stimulation;Moist Heat      Moist Heat Therapy   Number Minutes Moist Heat 20 Minutes     Moist Heat Location Lumbar Spine      Electrical Stimulation   Electrical Stimulation Location Left SIJ region.    Electrical Stimulation Action Pre-mod.    Electrical Stimulation Parameters 80-150 Hz x 20 minutes.    Electrical Stimulation Goals Pain                         PT Long Term Goals - 10/03/20 1755       PT LONG TERM GOAL #1   Title Independent with a HEP.    Time 6    Period Weeks    Status New      PT LONG TERM GOAL #2   Title Stand 45 minutes with pain not > 3/10.    Time 6    Period Weeks    Status New      PT LONG TERM GOAL #3   Title Sleep 6 hours undisturbed.    Time 6    Period Weeks    Status New                    Plan - 10/03/20 1749     Clinical Impression Statement The patient presents to [facility] with c/o chronic low back pain.  She is tender to palpation over her left SIJ.  Special testing is negative.  The patient reports her sleep is disturbed due to pain.  Her active lumbar flexion and extension is WNL with extension painful.  Her LE strength is normal.  Patient will benefit from skilled physical therapy intervention to address pain and deficits.    Personal Factors and Comorbidities Comorbidity 1;Other    Comorbidities DM, hypothyroidism, right knee scope.    Examination-Activity Limitations Stand;Other    Examination-Participation Restrictions Other    Stability/Clinical Decision Making Stable/Uncomplicated    Clinical Decision Making Low    Rehab Potential Excellent    PT Frequency 2x / week    PT Duration 6 weeks    PT Treatment/Interventions ADLs/Self Care Home Management;Cryotherapy;Electrical Stimulation;Ultrasound;Moist Heat;Functional mobility training;Therapeutic activities;Therapeutic exercise;Manual techniques;Patient/family education;Passive range of motion;Dry needling    PT Next Visit Plan Combo e'stim/US and STW/M to patient's left SIJ and upper gluteal region.  Core exercise progression with extension to  neutral.    Consulted and Agree with Plan of Care Patient  Patient will benefit from skilled therapeutic intervention in order to improve the following deficits and impairments:  Pain, Decreased activity tolerance, Increased muscle spasms  Visit Diagnosis: Chronic left-sided low back pain, unspecified whether sciatica present - Plan: PT plan of care cert/re-cert     Problem List Patient Active Problem List   Diagnosis Date Noted   Severe obesity (BMI >= 40) ([place]) 06/06/2014   Fluid retention in legs 06/02/2013   Hypothyroidism 11/03/2012   Allergic rhinitis 11/03/2012    Efraim Vanallen, [place] MPT 10/03/2020, 6:07 PM  [facility] [address] [place], [place], [address] Phone: [phone]   Fax:  [phone]  Name: TORANCE HEINLEN MRN: [id number] Date of Birth: 07/31/1970

## 2020-10-06 ENCOUNTER — Ambulatory Visit: Payer: BC Managed Care – PPO | Admitting: Physical Therapy

## 2020-10-06 ENCOUNTER — Other Ambulatory Visit: Payer: Self-pay

## 2020-10-06 DIAGNOSIS — M545 Low back pain, unspecified: Secondary | ICD-10-CM | POA: Diagnosis not present

## 2020-10-06 DIAGNOSIS — G8929 Other chronic pain: Secondary | ICD-10-CM

## 2020-10-06 NOTE — Therapy (Signed)
[facility] [place], [place], [address] Phone: [phone]   Fax:  [phone]  Physical Therapy Treatment  Patient Details  Name: RIQUEL ZOTTOLA MRN: [id number] Date of Birth: 1970-07-03 Referring Provider (PT): Ignacia Bayley PA-C   Encounter Date: 10/06/2020   PT End of Session - 10/06/20 1201     Visit Number 2    Number of Visits 12    Date for PT Re-Evaluation 11/14/20    PT Start Time 0900    PT Stop Time 0956    PT Time Calculation (min) 56 min    Activity Tolerance Patient tolerated treatment well             Past Medical History:  Diagnosis Date   Allergy    GERD (gastroesophageal reflux disease)    Hypothyroidism     Past Surgical History:  Procedure Laterality Date   BREAST BIOPSY Left 05/27/2013   CESAREAN SECTION     CHOLECYSTECTOMY N/A 05/11/2020   Procedure: LAPAROSCOPIC CHOLECYSTECTOMY;  Surgeon: Coralie Keens, MD;  Location: [place];  Service: General;  Laterality: N/A;   COLONOSCOPY WITH PROPOFOL N/A 03/24/2020   Procedure: COLONOSCOPY WITH PROPOFOL;  Surgeon: Carol Ada, MD;  [facility];  Service: Endoscopy;  Laterality: N/A;   KNEE ARTHROSCOPY Right 09/2014   LIVER BIOPSY N/A 05/11/2020   Procedure: LIVER BIOPSY;  Surgeon: Coralie Keens, MD;  Location: [facility];  Service: General;  Laterality: N/A;   POLYPECTOMY  03/24/2020   Procedure: POLYPECTOMY;  Surgeon: Carol Ada, MD;  [facility];  Service: Endoscopy;;    There were no vitals filed for this visit.   Subjective Assessment - 10/06/20 1024     Subjective COVID-19 screen performed prior to patient entering clinic.  No new complaints.    Pertinent History DM, hypothyroidism, right knee scope.    Currently in Pain? Yes    Pain Score 5     Pain Location Back    Pain Orientation Left    Pain Descriptors / Indicators Aching    Pain Relieving Factors rest.                                [facility] Adult PT Treatment/Exercise - 10/06/20 0001       Modalities   Modalities Electrical Stimulation;Ultrasound      Moist Heat Therapy   Number Minutes Moist Heat 20 Minutes    Moist Heat Location Lumbar Spine      Electrical Stimulation   Electrical Stimulation Location Left SIJ region    Electrical Stimulation Action Pre-mod.    Electrical Stimulation Parameters 80-150 Hz x 20 minutes.    Electrical Stimulation Goals Tone      Ultrasound   Ultrasound Location Combo e'stim/US at 1.50 W/CM2 to patient's left low back, SIJ and upper gluteal region x 12 minutes.      Manual Therapy   Manual Therapy Soft tissue mobilization    Soft tissue mobilization Right sdly position with pillow between knees for comfort:  STW/M x 12 minutes to patient's left low back, SIJ and upper gluteal region.                         PT Long Term Goals - 10/03/20 1755       PT LONG TERM GOAL #1   Title Independent with a HEP.    Time 6  Period Weeks    Status New      PT LONG TERM GOAL #2   Title Stand 45 minutes with pain not > 3/10.    Time 6    Period Weeks    Status New      PT LONG TERM GOAL #3   Title Sleep 6 hours undisturbed.    Time 6    Period Weeks    Status New                   Plan - 10/06/20 1204     Clinical Impression Statement The patient did very well today.  Her CC was palpable pain over her left SIJ region.  Plan to begin low-level core exercises next visit.    Personal Factors and Comorbidities Comorbidity 1;Other    Comorbidities DM, hypothyroidism, right knee scope.    Examination-Activity Limitations Stand;Other    Examination-Participation Restrictions Other    Stability/Clinical Decision Making Stable/Uncomplicated    Rehab Potential Excellent    PT Frequency 2x / week    PT Duration 6 weeks    PT Treatment/Interventions ADLs/Self Care Home Management;Cryotherapy;Electrical  Stimulation;Ultrasound;Moist Heat;Functional mobility training;Therapeutic activities;Therapeutic exercise;Manual techniques;Patient/family education;Passive range of motion;Dry needling    PT Next Visit Plan Combo e'stim/US and STW/M to patient's left SIJ and upper gluteal region.  Core exercise progression with extension to neutral.    Consulted and Agree with Plan of Care Patient             Patient will benefit from skilled therapeutic intervention in order to improve the following deficits and impairments:  Pain, Decreased activity tolerance, Increased muscle spasms  Visit Diagnosis: Chronic left-sided low back pain, unspecified whether sciatica present     Problem List Patient Active Problem List   Diagnosis Date Noted   Severe obesity (BMI >= 40) ([place]) 06/06/2014   Fluid retention in legs 06/02/2013   Hypothyroidism 11/03/2012   Allergic rhinitis 11/03/2012    Devaunte Gasparini, [place] MPT 10/06/2020, 12:06 PM  [facility] [address] [place], [place], [address] Phone: [phone]   Fax:  [phone]  Name: JENNILEE CULLOP MRN: [id number] Date of Birth: Jan 20, 1971

## 2020-10-12 ENCOUNTER — Ambulatory Visit: Payer: BC Managed Care – PPO | Attending: Orthopaedic Surgery | Admitting: Physical Therapy

## 2020-10-12 ENCOUNTER — Other Ambulatory Visit: Payer: Self-pay

## 2020-10-12 DIAGNOSIS — M545 Low back pain, unspecified: Secondary | ICD-10-CM | POA: Insufficient documentation

## 2020-10-12 DIAGNOSIS — G8929 Other chronic pain: Secondary | ICD-10-CM

## 2020-10-12 NOTE — Therapy (Signed)
[facility] [place], [place], [address] Phone: [phone]   Fax:  [phone]  Physical Therapy Treatment  Patient Details  Name: Sylvia Taylor MRN: [id number] Date of Birth: November 16, 1970 Referring Provider (PT): Ignacia Bayley PA-C   Encounter Date: 10/12/2020   PT End of Session - 10/12/20 1813     Visit Number 3    Number of Visits 12    Date for PT Re-Evaluation 11/14/20    PT Start Time 0446    PT Stop Time 0540    PT Time Calculation (min) 54 min    Activity Tolerance Patient tolerated treatment well    Behavior During Therapy [facility] for tasks assessed/performed             Past Medical History:  Diagnosis Date   Allergy    GERD (gastroesophageal reflux disease)    Hypothyroidism     Past Surgical History:  Procedure Laterality Date   BREAST BIOPSY Left 05/27/2013   CESAREAN SECTION     CHOLECYSTECTOMY N/A 05/11/2020   Procedure: LAPAROSCOPIC CHOLECYSTECTOMY;  Surgeon: Coralie Keens, MD;  Location: [place];  Service: General;  Laterality: N/A;   COLONOSCOPY WITH PROPOFOL N/A 03/24/2020   Procedure: COLONOSCOPY WITH PROPOFOL;  Surgeon: Carol Ada, MD;  [facility];  Service: Endoscopy;  Laterality: N/A;   KNEE ARTHROSCOPY Right 09/2014   LIVER BIOPSY N/A 05/11/2020   Procedure: LIVER BIOPSY;  Surgeon: Coralie Keens, MD;  Location: [place];  Service: General;  Laterality: N/A;   POLYPECTOMY  03/24/2020   Procedure: POLYPECTOMY;  Surgeon: Carol Ada, MD;  [facility];  Service: Endoscopy;;    There were no vitals filed for this visit.   Subjective Assessment - 10/12/20 1814     Subjective COVID-19 screen performed prior to patient entering clinic.  Patient states last treatment helped.    Pertinent History DM, hypothyroidism, right knee scope.    Currently in Pain? Yes    Pain Score 4     Pain Location Back    Pain Orientation Left    Pain Descriptors / Indicators  Aching                               [facility] Adult PT Treatment/Exercise - 10/12/20 0001       Modalities   Modalities Electrical Stimulation;Moist Heat;Ultrasound      Moist Heat Therapy   Number Minutes Moist Heat 20 Minutes    Moist Heat Location Lumbar Spine      Electrical Stimulation   Electrical Stimulation Location Left SIJ region.    Electrical Stimulation Action IFC at 80-150 Hz.    Electrical Stimulation Parameters 40% scan x 20 minutes.    Electrical Stimulation Goals Tone      Ultrasound   Ultrasound Location Right sdly position with pillow between knees for comfort:  Combo e'stim/US at 1.50 W/CM2 x 12 minutes.      Manual Therapy   Manual Therapy Soft tissue mobilization    Soft tissue mobilization STW/M x 11 minutes to patient's left low back, SIJ and upper gluteal region.                    PT Education - 10/12/20 1813     Education Details SKTC and hip bridges.    Person(s) Educated Patient    Methods Explanation    Comprehension Verbalized understanding  PT Long Term Goals - 10/03/20 1755       PT LONG TERM GOAL #1   Title Independent with a HEP.    Time 6    Period Weeks    Status New      PT LONG TERM GOAL #2   Title Stand 45 minutes with pain not > 3/10.    Time 6    Period Weeks    Status New      PT LONG TERM GOAL #3   Title Sleep 6 hours undisturbed.    Time 6    Period Weeks    Status New                   Plan - 10/12/20 1820     Clinical Impression Statement Patient did well with last treatment and today's.  She had less palpable pain over her left SIJ today.  We discussed beginning single knee to chest and hip bridges as part of a home exercise program.    Personal Factors and Comorbidities Comorbidity 1;Other    Comorbidities DM, hypothyroidism, right knee scope.    Examination-Activity Limitations Stand;Other    Examination-Participation Restrictions Other     Stability/Clinical Decision Making Stable/Uncomplicated    Rehab Potential Excellent    PT Frequency 2x / week    PT Duration 6 weeks    PT Treatment/Interventions ADLs/Self Care Home Management;Cryotherapy;Electrical Stimulation;Ultrasound;Moist Heat;Functional mobility training;Therapeutic activities;Therapeutic exercise;Manual techniques;Patient/family education;Passive range of motion;Dry needling    PT Next Visit Plan Combo e'stim/US and STW/M to patient's left SIJ and upper gluteal region.  Core exercise progression with extension to neutral.    Consulted and Agree with Plan of Care Patient             Patient will benefit from skilled therapeutic intervention in order to improve the following deficits and impairments:  Pain, Decreased activity tolerance, Increased muscle spasms  Visit Diagnosis: Chronic left-sided low back pain, unspecified whether sciatica present     Problem List Patient Active Problem List   Diagnosis Date Noted   Severe obesity (BMI >= 40) ([place]) 06/06/2014   Fluid retention in legs 06/02/2013   Hypothyroidism 11/03/2012   Allergic rhinitis 11/03/2012    Sylvia Taylor, Sylvia Taylor 10/12/2020, 6:23 PM  [facility] [address] [place], [place], [address] Phone: [phone]   Fax:  [phone]  Name: Sylvia Taylor MRN: [id number] Date of Birth: [birth year]

## 2020-10-13 ENCOUNTER — Ambulatory Visit
Admission: RE | Admit: 2020-10-13 | Discharge: 2020-10-13 | Disposition: A | Discharge status: Home / Self Care | Payer: BC Managed Care – PPO | Source: Ambulatory Visit | Attending: Gastroenterology | Admitting: Gastroenterology

## 2020-10-13 DIAGNOSIS — R1032 Left lower quadrant pain: Secondary | ICD-10-CM

## 2020-10-13 MED ORDER — IOPAMIDOL (ISOVUE-300) INJECTION 61%
100.0000 mL | Freq: Once | INTRAVENOUS | Status: AC | PRN
  Administered 2020-10-13: 100 mL via INTRAVENOUS

## 2020-10-17 ENCOUNTER — Ambulatory Visit: Payer: BC Managed Care – PPO | Admitting: Physical Therapy

## 2020-10-17 ENCOUNTER — Other Ambulatory Visit: Payer: Self-pay

## 2020-10-17 ENCOUNTER — Other Ambulatory Visit: Payer: Self-pay | Admitting: Gastroenterology

## 2020-10-17 DIAGNOSIS — M545 Low back pain, unspecified: Secondary | ICD-10-CM

## 2020-10-17 DIAGNOSIS — G8929 Other chronic pain: Secondary | ICD-10-CM

## 2020-10-17 NOTE — Therapy (Signed)
[facility] [place], [place], [address] Phone: [phone]   Fax:  [phone]  Physical Therapy Treatment  Patient Details  Name: Sylvia Taylor MRN: [id number] Date of Birth: 06/06/1970 Referring Provider (PT): Ignacia Bayley PA-C   Encounter Date: 10/17/2020   PT End of Session - 10/17/20 1817     Visit Number 4    Number of Visits 12    Date for PT Re-Evaluation 11/14/20    PT Start Time 0445    PT Stop Time 0543    PT Time Calculation (min) 58 min    Activity Tolerance Patient tolerated treatment well    Behavior During Therapy [facility] for tasks assessed/performed             Past Medical History:  Diagnosis Date   Allergy    GERD (gastroesophageal reflux disease)    Hypothyroidism     Past Surgical History:  Procedure Laterality Date   BREAST BIOPSY Left 05/27/2013   CESAREAN SECTION     CHOLECYSTECTOMY N/A 05/11/2020   Procedure: LAPAROSCOPIC CHOLECYSTECTOMY;  Surgeon: Coralie Keens, MD;  Location: [place];  Service: General;  Laterality: N/A;   COLONOSCOPY WITH PROPOFOL N/A 03/24/2020   Procedure: COLONOSCOPY WITH PROPOFOL;  Surgeon: Carol Ada, MD;  [facility];  Service: Endoscopy;  Laterality: N/A;   KNEE ARTHROSCOPY Right 09/2014   LIVER BIOPSY N/A 05/11/2020   Procedure: LIVER BIOPSY;  Surgeon: Coralie Keens, MD;  Location: [place];  Service: General;  Laterality: N/A;   POLYPECTOMY  03/24/2020   Procedure: POLYPECTOMY;  Surgeon: Carol Ada, MD;  [facility];  Service: Endoscopy;;    There were no vitals filed for this visit.   Subjective Assessment - 10/17/20 1817     Subjective COVID-19 screen performed prior to patient entering clinic.  Better.  Feels stiff today.    Pertinent History DM, hypothyroidism, right knee scope.    Currently in Pain? Yes    Pain Score 2     Pain Location Back    Pain Orientation Left    Pain Descriptors / Indicators --    "Stiff."                              [facility] Adult PT Treatment/Exercise - 10/17/20 0001       Modalities   Modalities Electrical Stimulation;Moist Heat;Ultrasound      Moist Heat Therapy   Number Minutes Moist Heat 20 Minutes    Moist Heat Location Lumbar Spine      Electrical Stimulation   Electrical Stimulation Location Left SIJ region.    Electrical Stimulation Action IFC at 80-150 Hz.    Electrical Stimulation Parameters 405 scan x 20 minutes.    Electrical Stimulation Goals Tone      Ultrasound   Ultrasound Location Left SIJ region.    Ultrasound Parameters Combo e'stim/US at 1.50 W/CM2 x 12 minutes.      Manual Therapy   Manual Therapy Soft tissue mobilization    Soft tissue mobilization STW/M x 14 minutes to patient's left SIJ/upper gluteal region.                  Upper Extremity Functional Index Score :   /80        PT Long Term Goals - 10/03/20 1755       PT LONG TERM GOAL #1   Title Independent with a HEP.  Time 6    Period Weeks    Status New      PT LONG TERM GOAL #2   Title Stand 45 minutes with pain not > 3/10.    Time 6    Period Weeks    Status New      PT LONG TERM GOAL #3   Title Sleep 6 hours undisturbed.    Time 6    Period Weeks    Status New                   Plan - 10/17/20 1819     Clinical Impression Statement Patient responding well to treatments.  She reports more of a stiffness in the left SIJ region vs pain.  She is sleeping wiht a pillow between her knees now which is helping.    Personal Factors and Comorbidities Comorbidity 1;Other    Comorbidities DM, hypothyroidism, right knee scope.    Stability/Clinical Decision Making Stable/Uncomplicated    Rehab Potential Excellent    PT Frequency 2x / week    PT Duration 6 weeks    PT Treatment/Interventions ADLs/Self Care Home Management;Cryotherapy;Electrical Stimulation;Ultrasound;Moist Heat;Functional mobility training;Therapeutic  activities;Therapeutic exercise;Manual techniques;Patient/family education;Passive range of motion;Dry needling    PT Next Visit Plan Combo e'stim/US and STW/M to patient's left SIJ and upper gluteal region.  Core exercise progression with extension to neutral.    Consulted and Agree with Plan of Care Patient             Patient will benefit from skilled therapeutic intervention in order to improve the following deficits and impairments:  Pain, Decreased activity tolerance, Increased muscle spasms  Visit Diagnosis: Chronic left-sided low back pain, unspecified whether sciatica present     Problem List Patient Active Problem List   Diagnosis Date Noted   Severe obesity (BMI >= 40) ([place]) 06/06/2014   Fluid retention in legs 06/02/2013   Hypothyroidism 11/03/2012   Allergic rhinitis 11/03/2012    Sylvia Taylor, [place], PT 10/17/2020, 6:22 PM  [facility] [address] [place], [place], [address] Phone: [phone]   Fax:  [phone]  Name: Sylvia Taylor MRN: [id number] Date of Birth: [birth year]

## 2020-10-20 ENCOUNTER — Encounter (HOSPITAL_COMMUNITY): Payer: Self-pay | Admitting: Gastroenterology

## 2020-10-24 ENCOUNTER — Other Ambulatory Visit: Payer: Self-pay

## 2020-10-24 ENCOUNTER — Ambulatory Visit: Payer: BC Managed Care – PPO | Admitting: Physical Therapy

## 2020-10-24 DIAGNOSIS — M545 Low back pain, unspecified: Secondary | ICD-10-CM

## 2020-10-24 DIAGNOSIS — G8929 Other chronic pain: Secondary | ICD-10-CM

## 2020-10-24 NOTE — Therapy (Signed)
[facility] [place], [place], [address] Phone: [phone]   Fax:  [phone]  Physical Therapy Treatment  Patient Details  Name: Sylvia Taylor MRN: [id number] Date of Birth: 1970/05/18 Referring Provider (PT): Ignacia Bayley PA-C   Encounter Date: 10/24/2020   PT End of Session - 10/24/20 1802     Visit Number 5    Number of Visits 12    Date for PT Re-Evaluation 11/14/20    PT Start Time 0445    PT Stop Time 0540    PT Time Calculation (min) 55 min    Activity Tolerance Patient tolerated treatment well    Behavior During Therapy [facility] for tasks assessed/performed             Past Medical History:  Diagnosis Date   Allergy    GERD (gastroesophageal reflux disease)    Hypothyroidism    PONV (postoperative nausea and vomiting)     Past Surgical History:  Procedure Laterality Date   BREAST BIOPSY Left 05/27/2013   CESAREAN SECTION     CHOLECYSTECTOMY N/A 05/11/2020   Procedure: LAPAROSCOPIC CHOLECYSTECTOMY;  Surgeon: Coralie Keens, MD;  Location: [place];  Service: General;  Laterality: N/A;   COLONOSCOPY WITH PROPOFOL N/A 03/24/2020   Procedure: COLONOSCOPY WITH PROPOFOL;  Surgeon: Carol Ada, MD;  Location[facility];  Service: Endoscopy;  Laterality: N/A;   KNEE ARTHROSCOPY Right 09/2014   LIVER BIOPSY N/A 05/11/2020   Procedure: LIVER BIOPSY;  Surgeon: Coralie Keens, MD;  Location: [place];  Service: General;  Laterality: N/A;   POLYPECTOMY  03/24/2020   Procedure: POLYPECTOMY;  Surgeon: Carol Ada, MD;  [facility];  Service: Endoscopy;;    There were no vitals filed for this visit.   Subjective Assessment - 10/24/20 1802     Subjective COVID-19 screen performed prior to patient entering clinic.  Doing good.    Pertinent History DM, hypothyroidism, right knee scope.    Currently in Pain? Yes    Pain Score 2     Pain Location Back    Pain Orientation Left                                [facility] Adult PT Treatment/Exercise - 10/24/20 0001       Exercises   Exercises Knee/Hip;Lumbar      Lumbar Exercises: Aerobic   Nustep Level 3 x 13 minutes.      Lumbar Exercises: Machines for Strengthening   Cybex Lumbar Extension 60# x 3 minutes.      Modalities   Modalities Electrical Stimulation;Moist Heat      Moist Heat Therapy   Number Minutes Moist Heat 20 Minutes    Moist Heat Location Lumbar Spine      Electrical Stimulation   Electrical Stimulation Location Left SIJ region.    Electrical Stimulation Action IFC at 80-150 Hz.    Electrical Stimulation Parameters 40% scan x 20 minutes.    Electrical Stimulation Goals Tone      Manual Therapy   Manual Therapy Soft tissue mobilization    Soft tissue mobilization STW/M x 10 minutes to patient's left SIJ region.                          PT Long Term Goals - 10/03/20 1755       PT LONG TERM GOAL #1   Title Independent with  a HEP.    Time 6    Period Weeks    Status New      PT LONG TERM GOAL #2   Title Stand 45 minutes with pain not > 3/10.    Time 6    Period Weeks    Status New      PT LONG TERM GOAL #3   Title Sleep 6 hours undisturbed.    Time 6    Period Weeks    Status New                   Plan - 10/24/20 1806     Clinical Impression Statement The patient is making very good progress with a 2/10 pain-level upon prsentation to the clinic today.  She did great with Nustep and back extension machine without complaint.    Personal Factors and Comorbidities Comorbidity 1;Other    Comorbidities DM, hypothyroidism, right knee scope.    Examination-Activity Limitations Stand;Other    Examination-Participation Restrictions Other    Stability/Clinical Decision Making Stable/Uncomplicated    Rehab Potential Excellent    PT Frequency 2x / week    PT Duration 6 weeks    PT Treatment/Interventions ADLs/Self Care Home  Management;Cryotherapy;Electrical Stimulation;Ultrasound;Moist Heat;Functional mobility training;Therapeutic activities;Therapeutic exercise;Manual techniques;Patient/family education;Passive range of motion;Dry needling    PT Next Visit Plan Combo e'stim/US and STW/M to patient's left SIJ and upper gluteal region.  Core exercise progression with extension to neutral.    Consulted and Agree with Plan of Care Patient             Patient will benefit from skilled therapeutic intervention in order to improve the following deficits and impairments:  Pain, Decreased activity tolerance, Increased muscle spasms  Visit Diagnosis: Chronic left-sided low back pain, unspecified whether sciatica present     Problem List Patient Active Problem List   Diagnosis Date Noted   Severe obesity (BMI >= 40) ([place]) 06/06/2014   Fluid retention in legs 06/02/2013   Hypothyroidism 11/03/2012   Allergic rhinitis 11/03/2012    Quintrell Baze, [place], PT 10/24/2020, 6:08 PM  [facility] [address] [place], [place], [address] Phone: [phone]   Fax:  [phone]  Name: Sylvia Taylor MRN: [id number] Date of Birth: 10/27/1970

## 2020-10-26 ENCOUNTER — Encounter: Payer: BC Managed Care – PPO | Admitting: Physical Therapy

## 2020-10-27 ENCOUNTER — Ambulatory Visit (HOSPITAL_COMMUNITY): Payer: BC Managed Care – PPO | Admitting: Anesthesiology

## 2020-10-27 ENCOUNTER — Other Ambulatory Visit: Payer: Self-pay

## 2020-10-27 ENCOUNTER — Encounter (HOSPITAL_COMMUNITY): Payer: Self-pay | Admitting: Gastroenterology

## 2020-10-27 ENCOUNTER — Ambulatory Visit (HOSPITAL_COMMUNITY)
Admission: RE | Admit: 2020-10-27 | Discharge: 2020-10-27 | Disposition: A | Discharge status: Home / Self Care | Payer: BC Managed Care – PPO | Attending: Gastroenterology | Admitting: Gastroenterology

## 2020-10-27 ENCOUNTER — Encounter (HOSPITAL_COMMUNITY)
Admission: RE | Disposition: A | Discharge status: Home / Self Care | Payer: Self-pay | Source: Home / Self Care | Attending: Gastroenterology

## 2020-10-27 DIAGNOSIS — Z79899 Other long term (current) drug therapy: Secondary | ICD-10-CM | POA: Diagnosis not present

## 2020-10-27 DIAGNOSIS — Z8249 Family history of ischemic heart disease and other diseases of the circulatory system: Secondary | ICD-10-CM | POA: Insufficient documentation

## 2020-10-27 DIAGNOSIS — R1012 Left upper quadrant pain: Secondary | ICD-10-CM | POA: Insufficient documentation

## 2020-10-27 DIAGNOSIS — K5730 Diverticulosis of large intestine without perforation or abscess without bleeding: Secondary | ICD-10-CM | POA: Insufficient documentation

## 2020-10-27 DIAGNOSIS — Z86010 Personal history of colonic polyps: Secondary | ICD-10-CM | POA: Insufficient documentation

## 2020-10-27 DIAGNOSIS — Z809 Family history of malignant neoplasm, unspecified: Secondary | ICD-10-CM | POA: Insufficient documentation

## 2020-10-27 DIAGNOSIS — Z836 Family history of other diseases of the respiratory system: Secondary | ICD-10-CM | POA: Diagnosis not present

## 2020-10-27 DIAGNOSIS — K319 Disease of stomach and duodenum, unspecified: Secondary | ICD-10-CM | POA: Insufficient documentation

## 2020-10-27 DIAGNOSIS — Z6841 Body Mass Index (BMI) 40.0 and over, adult: Secondary | ICD-10-CM | POA: Diagnosis not present

## 2020-10-27 DIAGNOSIS — Z9049 Acquired absence of other specified parts of digestive tract: Secondary | ICD-10-CM | POA: Diagnosis not present

## 2020-10-27 DIAGNOSIS — K219 Gastro-esophageal reflux disease without esophagitis: Secondary | ICD-10-CM | POA: Diagnosis not present

## 2020-10-27 HISTORY — PX: BIOPSY: SHX5522

## 2020-10-27 HISTORY — PX: ESOPHAGOGASTRODUODENOSCOPY (EGD) WITH PROPOFOL: SHX5813

## 2020-10-27 SURGERY — ESOPHAGOGASTRODUODENOSCOPY (EGD) WITH PROPOFOL
Anesthesia: Monitor Anesthesia Care

## 2020-10-27 MED ORDER — PROPOFOL 500 MG/50ML IV EMUL
INTRAVENOUS | Status: AC
  Filled 2020-10-27: qty 50

## 2020-10-27 MED ORDER — PROPOFOL 10 MG/ML IV BOLUS
INTRAVENOUS | Status: AC
  Filled 2020-10-27: qty 20

## 2020-10-27 MED ORDER — PROPOFOL 500 MG/50ML IV EMUL
INTRAVENOUS | Status: DC | PRN
  Administered 2020-10-27: 125 ug/kg/min via INTRAVENOUS

## 2020-10-27 MED ORDER — SODIUM CHLORIDE 0.9 % IV SOLN: INTRAVENOUS | Status: DC

## 2020-10-27 MED ORDER — LACTATED RINGERS IV SOLN: INTRAVENOUS | Status: DC

## 2020-10-27 MED ORDER — LIDOCAINE 2% (20 MG/ML) 5 ML SYRINGE
INTRAMUSCULAR | Status: DC | PRN
  Administered 2020-10-27: 100 mg via INTRAVENOUS

## 2020-10-27 MED ORDER — PROPOFOL 10 MG/ML IV BOLUS
INTRAVENOUS | Status: DC | PRN
  Administered 2020-10-27 (×4): 20 mg via INTRAVENOUS

## 2020-10-27 MED ORDER — LACTATED RINGERS IV SOLN
INTRAVENOUS | Status: AC | PRN
  Administered 2020-10-27: 1000 mL via INTRAVENOUS

## 2020-10-27 SURGICAL SUPPLY — 14 items

## 2020-10-27 NOTE — Transfer of Care (Signed)
Immediate Anesthesia Transfer of Care Note  Patient: SHALIKA ARNTZ  Procedure(s) Performed: ESOPHAGOGASTRODUODENOSCOPY (EGD) WITH PROPOFOL BIOPSY  Patient Location: Endoscopy Unit  Anesthesia Type:MAC  Level of Consciousness: awake, alert  and oriented  Airway & Oxygen Therapy: Patient Spontanous Breathing and Patient connected to face mask oxygen  Post-op Assessment: Report given to RN and Post -op Vital signs reviewed and stable  Post vital signs: Reviewed and stable  Last Vitals:  Vitals Value Taken Time  BP    Temp    Pulse 89 10/27/20 0740  Resp 15 10/27/20 0740  SpO2 100 % 10/27/20 0740  Vitals shown include unvalidated device data.  Last Pain:  Vitals:   10/27/20 0701  TempSrc: Oral  PainSc: 0-No pain         Complications: No notable events documented.

## 2020-10-27 NOTE — Discharge Instructions (Signed)

## 2020-10-27 NOTE — Progress Notes (Signed)
Handoff in recovery to [place].

## 2020-10-27 NOTE — Anesthesia Preprocedure Evaluation (Addendum)
Anesthesia Evaluation  Patient identified by MRN, date of birth, ID band Patient awake    Reviewed: Allergy & Precautions, NPO status , Patient's Chart, lab work & pertinent test results  Airway Mallampati: III  TM Distance: >3 FB Neck ROM: Full    Dental   Pulmonary neg pulmonary ROS,    breath sounds clear to auscultation       Cardiovascular negative cardio ROS   Rhythm:Regular Rate:Normal     Neuro/Psych negative neurological ROS     GI/Hepatic Neg liver ROS, GERD  ,  Endo/Other  Hypothyroidism Morbid obesity  Renal/GU negative Renal ROS     Musculoskeletal   Abdominal   Peds  Hematology negative hematology ROS (+)   Anesthesia Other Findings   Reproductive/Obstetrics                            Anesthesia Physical Anesthesia Plan  ASA: 3  Anesthesia Plan: MAC   Post-op Pain Management:    Induction:   PONV Risk Score and Plan: 2 and Propofol infusion, Ondansetron and Treatment may vary due to age or medical condition  Airway Management Planned: Natural Airway and Nasal Cannula  Additional Equipment:   Intra-op Plan:   Post-operative Plan:   Informed Consent: I have reviewed the patients History and Physical, chart, labs and discussed the procedure including the risks, benefits and alternatives for the proposed anesthesia with the patient or authorized representative who has indicated his/her understanding and acceptance.       Plan Discussed with:   Anesthesia Plan Comments:         Anesthesia Quick Evaluation

## 2020-10-27 NOTE — Anesthesia Postprocedure Evaluation (Signed)
Anesthesia Post Note  Patient: Sylvia Taylor  Procedure(s) Performed: ESOPHAGOGASTRODUODENOSCOPY (EGD) WITH PROPOFOL BIOPSY     Patient location during evaluation: PACU Anesthesia Type: MAC Level of consciousness: awake and alert Pain management: pain level controlled Vital Signs Assessment: post-procedure vital signs reviewed and stable Respiratory status: spontaneous breathing, nonlabored ventilation, respiratory function stable and patient connected to nasal cannula oxygen Cardiovascular status: stable and blood pressure returned to baseline Postop Assessment: no apparent nausea or vomiting Anesthetic complications: no   No notable events documented.  Last Vitals:  Vitals:   10/27/20 0800 10/27/20 0815  BP: 117/72 113/71  Pulse: 76 75  Resp: 15 13  Temp:    SpO2: 97% 97%    Last Pain:  Vitals:   10/27/20 0815  TempSrc:   PainSc: 0-No pain                 Tiajuana Amass

## 2020-10-27 NOTE — H&P (Signed)
Sylvia Taylor HPI: This 50 year old white female presents to the office for evaluation of LUQ pain which started 2 moths ago. The pain radiates to the left upper back at times. She has a laparoscopic cholecystectomy done on 05/11/2020 for biliary dyskinesia. She is taking Omeprazole 20 mg per day for acid reflux. She usually has 1-2 BM's per day but can go 1-2 days without a BM or have diarrhea. There is no obvious blood or mucus in the stool. She has good appetite and but has lost 60 pounds in the last 4 years, she claims intentionally. This was due to dietary changes. She denies any complaints of  nausea, vomiting, dysphagia or odynophagia. She denies having a family history of colon cancer, celiac sprue, or IBD. Her last colonoscopy was done on 03/24/2020 which revealed scattered diverticula in the sigmoid and descending colon, a tubular adenoma was removed from the cecum.  Past Medical History:  Diagnosis Date   Allergy    GERD (gastroesophageal reflux disease)    Hypothyroidism    PONV (postoperative nausea and vomiting)     Past Surgical History:  Procedure Laterality Date   BREAST BIOPSY Left 05/27/2013   CESAREAN SECTION     CHOLECYSTECTOMY N/A 05/11/2020   Procedure: LAPAROSCOPIC CHOLECYSTECTOMY;  Surgeon: Coralie Keens, MD;  Location: [place];  Service: General;  Laterality: N/A;   COLONOSCOPY WITH PROPOFOL N/A 03/24/2020   Procedure: COLONOSCOPY WITH PROPOFOL;  Surgeon: Carol Ada, MD;  [facility];  Service: Endoscopy;  Laterality: N/A;   KNEE ARTHROSCOPY Right 09/2014   LIVER BIOPSY N/A 05/11/2020   Procedure: LIVER BIOPSY;  Surgeon: Coralie Keens, MD;  Location: [place];  Service: General;  Laterality: N/A;   POLYPECTOMY  03/24/2020   Procedure: POLYPECTOMY;  Surgeon: Carol Ada, MD;  [facility];  Service: Endoscopy;;    Family History  Problem Relation Age of Onset   Hypertension Father    Sleep apnea Father    Cancer Maternal Grandmother         ovarian and thyroid cancer    Social History:  reports that she has never smoked. She has never used smokeless tobacco. She reports that she does not drink alcohol and does not use drugs.  Allergies: No Known Allergies  Medications: Scheduled: Continuous:  sodium chloride     lactated ringers     lactated ringers 1,000 mL (10/27/20 0704)    No results found for this or any previous visit (from the past 24 hour(s)).   No results found.  ROS:  As stated above in the HPI otherwise negative.  Blood pressure (!) 153/92, pulse 80, temperature 98 F (36.7 C), temperature source Oral, resp. rate 20, height '5\' 5"'$  (1.651 m), weight (!) 156.5 kg, last menstrual period 09/13/2015, SpO2 98 %.    PE: Gen: NAD, Alert and Oriented HEENT:  [place]/AT, EOMI Neck: Supple, no LAD Lungs: CTA Bilaterally CV: RRR without M/G/R ABD: Soft, NTND, +BS Ext: No C/C/E  Assessment/Plan: 1) LUQ pain - EGD.  Sylvia Taylor D 10/27/2020, 7:14 AM

## 2020-10-27 NOTE — Anesthesia Procedure Notes (Signed)
Date/Time: 10/27/2020 7:24 AM Performed by: Sharlette Dense, CRNA Oxygen Delivery Method: Simple face mask

## 2020-10-27 NOTE — Op Note (Signed)
[facility] Patient Name: Sylvia Taylor Procedure Date: 10/27/2020 MRN: [id number] Attending MD: Carol Ada , MD Date of Birth: 27-Mar-1970 CSN: [id number] Age: 50 Admit Type: Outpatient Procedure:                Upper GI endoscopy Indications:              Abdominal pain in the left upper quadrant Providers:                Carol Ada, MD, Doristine Johns, RN, Tyna Jaksch Technician Referring MD:              Medicines:                Propofol per Anesthesia Complications:            No immediate complications. Estimated Blood Loss:     Estimated blood loss: none. Procedure:                Pre-Anesthesia Assessment:                           - Prior to the procedure, a History and Physical                            was performed, and patient medications and                            allergies were reviewed. The patient's tolerance of                            previous anesthesia was also reviewed. The risks                            and benefits of the procedure and the sedation                            options and risks were discussed with the patient.                            All questions were answered, and informed consent                            was obtained. Prior Anticoagulants: The patient has                            taken no previous anticoagulant or antiplatelet                            agents. ASA Grade Assessment: III - A patient with                            severe systemic disease. After reviewing the risks  and benefits, the patient was deemed in                            satisfactory condition to undergo the procedure.                           - Sedation was administered by an anesthesia                            professional. Deep sedation was attained.                           After obtaining informed consent, the endoscope was                            passed under  direct vision. Throughout the                            procedure, the patient's blood pressure, pulse, and                            oxygen saturations were monitored continuously. The                            [id number] Olympus endoscope was introduced                            through the mouth, and advanced to the second part                            of duodenum. The upper GI endoscopy was                            accomplished without difficulty. The patient                            tolerated the procedure well. Scope In: Scope Out: Findings:      The esophagus was normal.      The entire examined stomach was normal. Biopsies were taken with a cold       forceps for Helicobacter pylori testing.      The examined duodenum was normal. Impression:               - Normal esophagus.                           - Normal stomach. Biopsied.                           - Normal examined duodenum. Moderate Sedation:      Not Applicable - Patient had care per Anesthesia. Recommendation:           - Patient has a contact number available for                            emergencies. The signs and  symptoms of potential                            delayed complications were discussed with the                            patient. Return to normal activities tomorrow.                            Written discharge instructions were provided to the                            patient.                           - Resume previous diet.                           - Continue present medications.                           - Await pathology results.                           - Trial of a Medrol Dose Pack.                           - Follow up with Dr. Collene Mares in 4 weeks. Procedure Code(s):        --- Professional ---                           [phone], Esophagogastroduodenoscopy, flexible,                            transoral; with biopsy, single or multiple Diagnosis Code(s):        --- Professional  ---                           R10.12, Left upper quadrant pain CPT copyright 2019 American Medical Association. All rights reserved. The codes documented in this report are preliminary and upon coder review may  be revised to meet current compliance requirements. Carol Ada, MD Carol Ada, MD 10/27/2020 7:43:43 AM This report has been signed electronically. Number of Addenda: 0

## 2020-10-29 ENCOUNTER — Ambulatory Visit
Admission: RE | Admit: 2020-10-29 | Discharge: 2020-10-29 | Disposition: A | Discharge status: Home / Self Care | Payer: BC Managed Care – PPO | Source: Ambulatory Visit | Attending: Orthopaedic Surgery | Admitting: Orthopaedic Surgery

## 2020-10-29 DIAGNOSIS — M47894 Other spondylosis, thoracic region: Secondary | ICD-10-CM

## 2020-10-30 ENCOUNTER — Encounter (HOSPITAL_COMMUNITY): Payer: Self-pay | Admitting: Gastroenterology

## 2020-10-30 LAB — SURGICAL PATHOLOGY

## 2020-10-31 ENCOUNTER — Other Ambulatory Visit: Payer: Self-pay

## 2020-10-31 ENCOUNTER — Encounter: Payer: Self-pay | Admitting: Physical Therapy

## 2020-10-31 ENCOUNTER — Ambulatory Visit: Payer: BC Managed Care – PPO | Admitting: Physical Therapy

## 2020-10-31 DIAGNOSIS — M545 Low back pain, unspecified: Secondary | ICD-10-CM | POA: Diagnosis not present

## 2020-10-31 DIAGNOSIS — G8929 Other chronic pain: Secondary | ICD-10-CM

## 2020-10-31 NOTE — Therapy (Signed)
[facility] [place], [place], [address] Phone: [phone]   Fax:  [phone]  Physical Therapy Treatment  Patient Details  Name: Sylvia Taylor MRN: [id number] Date of Birth: [birth year] Referring Provider (PT): Ignacia Bayley PA-C   Encounter Date: 10/31/2020   PT End of Session - 10/31/20 1744     Visit Number 6    Number of Visits 12    Date for PT Re-Evaluation 11/14/20    PT Start Time 0445    PT Stop Time 0543    PT Time Calculation (min) 58 min    Activity Tolerance Patient tolerated treatment well    Behavior During Therapy [facility] for tasks assessed/performed             Past Medical History:  Diagnosis Date   Allergy    GERD (gastroesophageal reflux disease)    Hypothyroidism    PONV (postoperative nausea and vomiting)     Past Surgical History:  Procedure Laterality Date   BIOPSY  10/27/2020   Procedure: BIOPSY;  Surgeon: Carol Ada, MD;  [facility];  Service: Endoscopy;;   BREAST BIOPSY Left 05/27/2013   CESAREAN SECTION     CHOLECYSTECTOMY N/A 05/11/2020   Procedure: LAPAROSCOPIC CHOLECYSTECTOMY;  Surgeon: Coralie Keens, MD;  Location: [place];  Service: General;  Laterality: N/A;   COLONOSCOPY WITH PROPOFOL N/A 03/24/2020   Procedure: COLONOSCOPY WITH PROPOFOL;  Surgeon: Carol Ada, MD;  [facility];  Service: Endoscopy;  Laterality: N/A;   ESOPHAGOGASTRODUODENOSCOPY (EGD) WITH PROPOFOL N/A 10/27/2020   Procedure: ESOPHAGOGASTRODUODENOSCOPY (EGD) WITH PROPOFOL;  Surgeon: Carol Ada, MD;  [facility];  Service: Endoscopy;  Laterality: N/A;   KNEE ARTHROSCOPY Right 09/2014   LIVER BIOPSY N/A 05/11/2020   Procedure: LIVER BIOPSY;  Surgeon: Coralie Keens, MD;  Location: [place];  Service: General;  Laterality: N/A;   POLYPECTOMY  03/24/2020   Procedure: POLYPECTOMY;  Surgeon: Carol Ada, MD;  [facility];  Service: Endoscopy;;     There were no vitals filed for this visit.   Subjective Assessment - 10/31/20 1731     Subjective COVID-19 screen performed prior to patient entering clinic.  Flare-up this weekend.    Pertinent History DM, hypothyroidism, right knee scope.    Currently in Pain? Yes    Pain Score 4     Pain Location Back    Pain Orientation Left    Pain Descriptors / Indicators Aching                               [facility] Adult PT Treatment/Exercise - 10/31/20 0001       Exercises   Exercises Knee/Hip;Lumbar      Lumbar Exercises: Aerobic   Nustep Level 4 x 12 minutes.      Lumbar Exercises: Machines for Strengthening   Cybex Lumbar Extension 60# x 3 minutes.      Modalities   Modalities Electrical Stimulation;Moist Heat      Moist Heat Therapy   Number Minutes Moist Heat 20 Minutes    Moist Heat Location Lumbar Spine      Electrical Stimulation   Electrical Stimulation Location Left SIJ region    Electrical Stimulation Action IFC at 80-150 Hz.    Electrical Stimulation Parameters 40% scan x 20 minutes.    Electrical Stimulation Goals Pain;Tone      Manual Therapy   Manual Therapy Soft tissue mobilization  Soft tissue mobilization STW/M x 17 minutes to patient's left low back/SIJ/upper gluteal region.                     PT Education - 10/31/20 1736     Education Details Discussed standing hip extension and wall slides.                 PT Long Term Goals - 10/03/20 1755       PT LONG TERM GOAL #1   Title Independent with a HEP.    Time 6    Period Weeks    Status New      PT LONG TERM GOAL #2   Title Stand 45 minutes with pain not > 3/10.    Time 6    Period Weeks    Status New      PT LONG TERM GOAL #3   Title Sleep 6 hours undisturbed.    Time 6    Period Weeks    Status New                   Plan - 10/31/20 1741     Clinical Impression Statement The patient had a flare-up over the weekend and presented  to the clinic today with a 4/10 pain-level.  Her pain was reduced significantly following ther ex.  We discussed adding standing hip extension and wall slides to her HEP.    Personal Factors and Comorbidities Comorbidity 1;Other    Comorbidities DM, hypothyroidism, right knee scope.    Examination-Participation Restrictions Other    Stability/Clinical Decision Making Stable/Uncomplicated    Rehab Potential Excellent    PT Frequency 2x / week    PT Duration 6 weeks    PT Treatment/Interventions ADLs/Self Care Home Management;Cryotherapy;Electrical Stimulation;Ultrasound;Moist Heat;Functional mobility training;Therapeutic activities;Therapeutic exercise;Manual techniques;Patient/family education;Passive range of motion;Dry needling    PT Next Visit Plan Combo e'stim/US and STW/M to patient's left SIJ and upper gluteal region.  Core exercise progression with extension to neutral.    Consulted and Agree with Plan of Care Patient             Patient will benefit from skilled therapeutic intervention in order to improve the following deficits and impairments:  Pain, Decreased activity tolerance, Increased muscle spasms  Visit Diagnosis: Chronic left-sided low back pain, unspecified whether sciatica present     Problem List Patient Active Problem List   Diagnosis Date Noted   Severe obesity (BMI >= 40) ([place]) 06/06/2014   Fluid retention in legs 06/02/2013   Hypothyroidism 11/03/2012   Allergic rhinitis 11/03/2012    Izell Labat, [place], PT 10/31/2020, 5:51 PM  [facility] [address] [place], [place], [address] Phone: [phone]   Fax:  [phone]  Name: Sylvia Taylor MRN: [id number] Date of Birth: [birth year]

## 2020-11-07 ENCOUNTER — Other Ambulatory Visit: Payer: Self-pay

## 2020-11-07 ENCOUNTER — Ambulatory Visit: Payer: BC Managed Care – PPO | Admitting: *Deleted

## 2020-11-07 DIAGNOSIS — M545 Low back pain, unspecified: Secondary | ICD-10-CM

## 2020-11-07 DIAGNOSIS — G8929 Other chronic pain: Secondary | ICD-10-CM

## 2020-11-07 NOTE — Therapy (Signed)
[facility] [place], [place], [address] Phone: [phone]   Fax:  [phone]  Physical Therapy Treatment  Patient Details  Name: Sylvia Taylor MRN: [id number] Date of Birth: 09-Feb-1971 Referring Provider (PT): Ignacia Bayley PA-C   Encounter Date: 11/07/2020   PT End of Session - 11/07/20 1650     Visit Number 7    Number of Visits 12    Date for PT Re-Evaluation 11/14/20    PT Start Time 6761    PT Stop Time 9509    PT Time Calculation (min) 50 min             Past Medical History:  Diagnosis Date   Allergy    GERD (gastroesophageal reflux disease)    Hypothyroidism    PONV (postoperative nausea and vomiting)     Past Surgical History:  Procedure Laterality Date   BIOPSY  10/27/2020   Procedure: BIOPSY;  Surgeon: Carol Ada, MD;  [facility];  Service: Endoscopy;;   BREAST BIOPSY Left 05/27/2013   CESAREAN SECTION     CHOLECYSTECTOMY N/A 05/11/2020   Procedure: LAPAROSCOPIC CHOLECYSTECTOMY;  Surgeon: Coralie Keens, MD;  Location: [place];  Service: General;  Laterality: N/A;   COLONOSCOPY WITH PROPOFOL N/A 03/24/2020   Procedure: COLONOSCOPY WITH PROPOFOL;  Surgeon: Carol Ada, MD;  [facility];  Service: Endoscopy;  Laterality: N/A;   ESOPHAGOGASTRODUODENOSCOPY (EGD) WITH PROPOFOL N/A 10/27/2020   Procedure: ESOPHAGOGASTRODUODENOSCOPY (EGD) WITH PROPOFOL;  Surgeon: Carol Ada, MD;  [facility];  Service: Endoscopy;  Laterality: N/A;   KNEE ARTHROSCOPY Right 09/2014   LIVER BIOPSY N/A 05/11/2020   Procedure: LIVER BIOPSY;  Surgeon: Coralie Keens, MD;  Location: [place];  Service: General;  Laterality: N/A;   POLYPECTOMY  03/24/2020   Procedure: POLYPECTOMY;  Surgeon: Carol Ada, MD;  [facility];  Service: Endoscopy;;    There were no vitals filed for this visit.   Subjective Assessment - 11/07/20 1646     Subjective COVID-19 screen  performed prior to patient entering clinic.  Doing good today 1-2/10    Pertinent History DM, hypothyroidism, right knee scope.    Currently in Pain? Yes    Pain Score 2     Pain Location Back    Pain Orientation Left    Pain Descriptors / Indicators Aching;Sore                               [facility] Adult PT Treatment/Exercise - 11/07/20 0001       Exercises   Exercises Knee/Hip;Lumbar      Lumbar Exercises: Aerobic   Nustep Level 4 x 15 minutes.      Lumbar Exercises: Machines for Strengthening   Cybex Lumbar Extension 60# x 3 minutes.      Lumbar Exercises: Standing   Other Standing Lumbar Exercises Standing modified bird-dog with LE's only x 6 on each side hold 5-10 seconds      Modalities   Modalities Electrical Stimulation;Moist Heat      Moist Heat Therapy   Number Minutes Moist Heat 15 Minutes    Moist Heat Location Lumbar Spine      Electrical Stimulation   Electrical Stimulation Location Left SIJ region    Electrical Stimulation Action IFC x15 mins    Electrical Stimulation Parameters 80-150 hz    Electrical Stimulation Goals Pain;Tone  PT Long Term Goals - 10/03/20 1755       PT LONG TERM GOAL #1   Title Independent with a HEP.    Time 6    Period Weeks    Status New      PT LONG TERM GOAL #2   Title Stand 45 minutes with pain not > 3/10.    Time 6    Period Weeks    Status New      PT LONG TERM GOAL #3   Title Sleep 6 hours undisturbed.    Time 6    Period Weeks    Status New                   Plan - 11/07/20 1651     Personal Factors and Comorbidities Comorbidity 1;Other    Comorbidities DM, hypothyroidism, right knee scope.    Examination-Activity Limitations Stand;Other    Examination-Participation Restrictions Other    Stability/Clinical Decision Making Stable/Uncomplicated    Rehab Potential Excellent    PT Frequency 2x / week    PT Duration 6 weeks    PT  Treatment/Interventions ADLs/Self Care Home Management;Cryotherapy;Electrical Stimulation;Ultrasound;Moist Heat;Functional mobility training;Therapeutic activities;Therapeutic exercise;Manual techniques;Patient/family education;Passive range of motion;Dry needling    PT Next Visit Plan Combo e'stim/US and STW/M to patient's left SIJ and upper gluteal region.  Core exercise progression with extension to neutral.             Patient will benefit from skilled therapeutic intervention in order to improve the following deficits and impairments:  Pain, Decreased activity tolerance, Increased muscle spasms  Visit Diagnosis: Chronic left-sided low back pain, unspecified whether sciatica present     Problem List Patient Active Problem List   Diagnosis Date Noted   Severe obesity (BMI >= 40) ([place]) 06/06/2014   Fluid retention in legs 06/02/2013   Hypothyroidism 11/03/2012   Allergic rhinitis 11/03/2012    Sylvia Taylor,[name], PTA 11/07/2020, 6:00 PM  [facility] [address] [place], [place], [address] Phone: [phone]   Fax:  [phone]  Name: Sylvia Taylor MRN: [id number] Date of Birth: Nov 21, 1970

## 2020-11-16 ENCOUNTER — Other Ambulatory Visit: Payer: Self-pay

## 2020-11-16 ENCOUNTER — Ambulatory Visit: Payer: BC Managed Care – PPO | Attending: Orthopaedic Surgery | Admitting: Physical Therapy

## 2020-11-16 ENCOUNTER — Encounter: Payer: Self-pay | Admitting: Physical Therapy

## 2020-11-16 DIAGNOSIS — G8929 Other chronic pain: Secondary | ICD-10-CM | POA: Diagnosis present

## 2020-11-16 DIAGNOSIS — M545 Low back pain, unspecified: Secondary | ICD-10-CM | POA: Diagnosis present

## 2020-11-16 NOTE — Patient Instructions (Signed)
[facility]   DRY NEEDLING CONSENT FORM   Trigger point dry needling is a physical therapy approach to treat Myofascial Pain and Dysfunction.  Dry Needling (DN) is a valuable and effective way to deactivate myofascial trigger points (muscle knots/pain). It is skilled intervention that uses a thin filiform needle to penetrate the skin and stimulate underlying myofascial trigger points, muscular, and connective tissues for the management of neuromusculoskeletal pain and movement impairments.  A local twitch response (LTR) will be elicited.  This can sometimes feel like a deep ache in the muscle during the procedure. Multiple trigger points in multiple muscles can be treated during each treatment.  No medication of any kind is injected.   As with any medical treatment and procedure, there are possible adverse events.  While significant adverse events are uncommon, they do sometimes occur and must be considered prior to giving consent.  Dry needling often causes a "post needling soreness".  There can be an increase in pain from a couple of hours to 2-3 days, followed by an improvement in the overall pain state. Any time a needle is used there is a risk of infection.  However, we are using new, sterile, and disposable needles; infections are extremely rare. There is a possibility that you may bleed or bruise.  You may feel tired and some nausea following treatment. There is a rare possibility of a pneumothorax (air in the chest cavity). Allergic reaction to nickel in the stainless steel needle. If a nerve is touched, it may cause paresthesia (a prickling/shock sensation) which is usually brief, but may continue for a couple of days.  Following treatment stay hydrated.  Continue regular activities but not too vigorous initially after treatment for 24-48 hours.  Dry Needling is best when combined with other physical therapy interventions such as strengthening, stretching  and other therapeutic modalities.   PLEASE ANSWER THE FOLLOWING QUESTIONS:  Do you have a lack of sensation?   Y/N  Do you have a phobia or fear of needles  Y/N  Are you pregnant?    Y/N If yes:  How many weeks? __________ Do you have any implanted devices?  Y/N If yes:  Pacemaker/Spinal Cord Stimulator/Deep Brain Stimulator/Insulin Pump/Other: ________________ Do you have any implants?  Y/N If yes: Breast/Facial/Pecs/Buttocks/Calves/Hip  Replacement/ Knee Replacement/Other: _________ Do you take any blood thinners?   Y/N If yes: Coumadin (Warfarin)/Other: ___________________ Do you have a bleeding disorder?   Y/N If yes: What kind: _________________________________ Do you take any immunosuppressants?  Y/N If yes:   What kind: _________________________________ Do you take anti-inflammatories?   Y/N If yes: What kind: Advil/Aspirin/Other: ________________ Have you ever been diagnosed with Scoliosis? Y/N Have you had back surgery?   Y/N If yes:  Laminectomy/Fusion/Other: ___________________   I have read, or had read to me, the above.  I have had the opportunity to ask any questions.  All of my questions have been answered to my satisfaction and I understand the risks involved with dry needling.  I consent to examination and treatment at [facility], including dry needling, of any and all of my involved and affected muscles.     Signature: __________________________________     Date:___________________________________________________     

## 2020-11-16 NOTE — Therapy (Signed)
[facility] [place], [place], [address] Phone: [phone]   Fax:  [phone]  Physical Therapy Treatment  Patient Details  Name: Sylvia Taylor MRN: [id number] Date of Birth: Nov 16, 1970 Referring Provider (PT): Ignacia Bayley PA-C   Encounter Date: 11/16/2020   PT End of Session - 11/16/20 1650     Visit Number 8    Number of Visits 12    Date for PT Re-Evaluation 11/14/20    PT Start Time 0400    PT Stop Time 6962    PT Time Calculation (min) 52 min    Activity Tolerance Patient tolerated treatment well    Behavior During Therapy [facility] for tasks assessed/performed             Past Medical History:  Diagnosis Date   Allergy    GERD (gastroesophageal reflux disease)    Hypothyroidism    PONV (postoperative nausea and vomiting)     Past Surgical History:  Procedure Laterality Date   BIOPSY  10/27/2020   Procedure: BIOPSY;  Surgeon: Carol Ada, MD;  [facility];  Service: Endoscopy;;   BREAST BIOPSY Left 05/27/2013   CESAREAN SECTION     CHOLECYSTECTOMY N/A 05/11/2020   Procedure: LAPAROSCOPIC CHOLECYSTECTOMY;  Surgeon: Coralie Keens, MD;  Location: [place];  Service: General;  Laterality: N/A;   COLONOSCOPY WITH PROPOFOL N/A 03/24/2020   Procedure: COLONOSCOPY WITH PROPOFOL;  Surgeon: Carol Ada, MD;  [facility];  Service: Endoscopy;  Laterality: N/A;   ESOPHAGOGASTRODUODENOSCOPY (EGD) WITH PROPOFOL N/A 10/27/2020   Procedure: ESOPHAGOGASTRODUODENOSCOPY (EGD) WITH PROPOFOL;  Surgeon: Carol Ada, MD;  [facility];  Service: Endoscopy;  Laterality: N/A;   KNEE ARTHROSCOPY Right 09/2014   LIVER BIOPSY N/A 05/11/2020   Procedure: LIVER BIOPSY;  Surgeon: Coralie Keens, MD;  Location: [place];  Service: General;  Laterality: N/A;   POLYPECTOMY  03/24/2020   Procedure: POLYPECTOMY;  Surgeon: Carol Ada, MD;  [facility];  Service: Endoscopy;;     There were no vitals filed for this visit.   Subjective Assessment - 11/16/20 1648     Subjective COVID-19 screen performed prior to patient entering clinic.  SI flared up to a 6 recently for no apparent reason.    Pertinent History DM, hypothyroidism, right knee scope.    Currently in Pain? Yes    Pain Score 6     Pain Location Back    Pain Orientation Left    Pain Descriptors / Indicators Sharp;Stabbing                               [facility] Adult PT Treatment/Exercise - 11/16/20 0001       Exercises   Exercises Knee/Hip      Lumbar Exercises: Aerobic   Nustep Level 4 x 10 minutes.      Electrical Stimulation   Electrical Stimulation Location Left upper gluteal region    Electrical Stimulation Action IFC at 80-150 Hz.    Electrical Stimulation Parameters 405 scanx 20 minutes.    Electrical Stimulation Goals Tone;Pain      Manual Therapy   Manual Therapy Soft tissue mobilization    Soft tissue mobilization STW/M x 13 minutes to patient's left upper gluteal musculature              Trigger Point Dry Needling - 11/16/20 0001     Consent Given? Yes    Education Handout Provided Yes  Muscles Treated Back/Hip Gluteus medius;Tensor fascia lata    Gluteus Medius Response Twitch response elicited    Tensor Fascia Lata Response Twitch response elicited                        PT Long Term Goals - 10/03/20 1755       PT LONG TERM GOAL #1   Title Independent with a HEP.    Time 6    Period Weeks    Status New      PT LONG TERM GOAL #2   Title Stand 45 minutes with pain not > 3/10.    Time 6    Period Weeks    Status New      PT LONG TERM GOAL #3   Title Sleep 6 hours undisturbed.    Time 6    Period Weeks    Status New                   Plan - 11/16/20 1712     Clinical Impression Statement Patient presented to the clinic today with an inexplicable flared-up of her left SIJ pain.  Performed dry needling  today to left upper gluteal musculature which patient tolerated without complaint.    Personal Factors and Comorbidities Comorbidity 1;Other    Comorbidities DM, hypothyroidism, right knee scope.    Examination-Activity Limitations Stand;Other    Examination-Participation Restrictions Other    Stability/Clinical Decision Making Stable/Uncomplicated    Rehab Potential Excellent    PT Frequency 2x / week    PT Duration 6 weeks    PT Treatment/Interventions ADLs/Self Care Home Management;Cryotherapy;Electrical Stimulation;Ultrasound;Moist Heat;Functional mobility training;Therapeutic activities;Therapeutic exercise;Manual techniques;Patient/family education;Passive range of motion;Dry needling    PT Next Visit Plan Combo e'stim/US and STW/M to patient's left SIJ and upper gluteal region.  Core exercise progression with extension to neutral.    Consulted and Agree with Plan of Care Patient             Patient will benefit from skilled therapeutic intervention in order to improve the following deficits and impairments:  Pain, Decreased activity tolerance, Increased muscle spasms  Visit Diagnosis: Chronic left-sided low back pain, unspecified whether sciatica present     Problem List Patient Active Problem List   Diagnosis Date Noted   Severe obesity (BMI >= 40) ([place]) 06/06/2014   Fluid retention in legs 06/02/2013   Hypothyroidism 11/03/2012   Allergic rhinitis 11/03/2012    Daquan Crapps, [place], PT 11/16/2020, 5:14 PM  [facility] [address] [place], [place], [address] Phone: [phone]   Fax:  [phone]  Name: KYREN VAUX MRN: [id number] Date of Birth: [birth year]

## 2020-11-23 ENCOUNTER — Ambulatory Visit: Payer: BC Managed Care – PPO | Admitting: Physical Therapy

## 2020-11-23 ENCOUNTER — Other Ambulatory Visit: Payer: Self-pay

## 2020-11-23 DIAGNOSIS — G8929 Other chronic pain: Secondary | ICD-10-CM

## 2020-11-23 DIAGNOSIS — M545 Low back pain, unspecified: Secondary | ICD-10-CM | POA: Diagnosis not present

## 2020-11-23 NOTE — Therapy (Signed)
[facility] [place], [place], [address] Phone: [phone]   Fax:  [phone]  Physical Therapy Treatment  Patient Details  Name: Sylvia Taylor MRN: [id number] Date of Birth: September 28, 1970 Referring Provider (PT): Ignacia Bayley PA-C   Encounter Date: 11/23/2020    Past Medical History:  Diagnosis Date   Allergy    GERD (gastroesophageal reflux disease)    Hypothyroidism    PONV (postoperative nausea and vomiting)     Past Surgical History:  Procedure Laterality Date   BIOPSY  10/27/2020   Procedure: BIOPSY;  Surgeon: Carol Ada, MD;  [facility];  Service: Endoscopy;;   BREAST BIOPSY Left 05/27/2013   CESAREAN SECTION     CHOLECYSTECTOMY N/A 05/11/2020   Procedure: LAPAROSCOPIC CHOLECYSTECTOMY;  Surgeon: Coralie Keens, MD;  Location: [place];  Service: General;  Laterality: N/A;   COLONOSCOPY WITH PROPOFOL N/A 03/24/2020   Procedure: COLONOSCOPY WITH PROPOFOL;  Surgeon: Carol Ada, MD;  [facility];  Service: Endoscopy;  Laterality: N/A;   ESOPHAGOGASTRODUODENOSCOPY (EGD) WITH PROPOFOL N/A 10/27/2020   Procedure: ESOPHAGOGASTRODUODENOSCOPY (EGD) WITH PROPOFOL;  Surgeon: Carol Ada, MD;  [facility];  Service: Endoscopy;  Laterality: N/A;   KNEE ARTHROSCOPY Right 09/2014   LIVER BIOPSY N/A 05/11/2020   Procedure: LIVER BIOPSY;  Surgeon: Coralie Keens, MD;  Location: [place];  Service: General;  Laterality: N/A;   POLYPECTOMY  03/24/2020   Procedure: POLYPECTOMY;  Surgeon: Carol Ada, MD;  [facility];  Service: Endoscopy;;    There were no vitals filed for this visit.   Subjective Assessment - 11/23/20 1740     Subjective COVID-19 screen performed prior to patient entering clinic.  Patient stating she is experiencing more stiffness than actually pain.  She did well with dry needling.    Pertinent History DM, hypothyroidism, right knee scope.     Currently in Pain? --   No number provided today.Marland Kitchen'Stiffness" reported.                              [facility] Adult PT Treatment/Exercise - 11/23/20 0001       Modalities   Modalities Electrical Stimulation;Moist Heat      Moist Heat Therapy   Number Minutes Moist Heat 20 Minutes    Moist Heat Location Lumbar Spine      Electrical Stimulation   Electrical Stimulation Location Left LB/upper glut    Electrical Stimulation Action IFC at 80-150 Hz.    Electrical Stimulation Parameters 40% scan x 20 minutes.    Electrical Stimulation Goals Tone;Pain      Manual Therapy   Manual Therapy Joint mobilization;Soft tissue mobilization    Joint Mobilization In supine;  Sustain left femoral traction and oscilliations f/b left belt mobs to hip (8 minutes total)    Soft tissue mobilization STW/M x 15 minutes to patient left low back/QL and SIJ region              Trigger Point Dry Needling - 11/23/20 0001     Consent Given? Yes    Muscles Treated Back/Hip Gluteus medius    Gluteus Medius Response Twitch response elicited                        PT Long Term Goals - 10/03/20 1755       PT LONG TERM GOAL #1   Title Independent with a HEP.  Time 6    Period Weeks    Status New      PT LONG TERM GOAL #2   Title Stand 45 minutes with pain not > 3/10.    Time 6    Period Weeks    Status New      PT LONG TERM GOAL #3   Title Sleep 6 hours undisturbed.    Time 6    Period Weeks    Status New                   Plan - 11/23/20 1754     Clinical Impression Statement Patient presented to the clinic today with more of a "stiffness" vs pain.  She feels the treatments have been helpful.  She has done well with dry needling.  Added left femoral and hip mobs which she tolerated well.    Personal Factors and Comorbidities Comorbidity 1;Other    Comorbidities DM, hypothyroidism, right knee scope.    Examination-Activity Limitations Stand;Other     Examination-Participation Restrictions Other    Stability/Clinical Decision Making Stable/Uncomplicated    Rehab Potential Excellent    PT Frequency 2x / week    PT Duration 6 weeks    PT Treatment/Interventions ADLs/Self Care Home Management;Cryotherapy;Electrical Stimulation;Ultrasound;Moist Heat;Functional mobility training;Therapeutic activities;Therapeutic exercise;Manual techniques;Patient/family education;Passive range of motion;Dry needling    PT Next Visit Plan Dry needling and left femoral traction.    Consulted and Agree with Plan of Care Patient             Patient will benefit from skilled therapeutic intervention in order to improve the following deficits and impairments:  Pain, Decreased activity tolerance, Increased muscle spasms  Visit Diagnosis: Chronic left-sided low back pain, unspecified whether sciatica present     Problem List Patient Active Problem List   Diagnosis Date Noted   Severe obesity (BMI >= 40) ([place]) 06/06/2014   Fluid retention in legs 06/02/2013   Hypothyroidism 11/03/2012   Allergic rhinitis 11/03/2012    Daiden Coltrane, [place], PT 11/23/2020, 5:57 PM  [facility] [address] [place], [place], [address] Phone: [phone]   Fax:  [phone]  Name: Sylvia Taylor MRN: [id number] Date of Birth: 03/17/1970

## 2020-11-30 ENCOUNTER — Ambulatory Visit: Payer: BC Managed Care – PPO | Admitting: Physical Therapy

## 2020-11-30 DIAGNOSIS — M545 Low back pain, unspecified: Secondary | ICD-10-CM

## 2020-11-30 DIAGNOSIS — G8929 Other chronic pain: Secondary | ICD-10-CM

## 2020-11-30 NOTE — Therapy (Signed)
[facility] [place], [place], [address] Phone: [phone]   Fax:  [phone]  Physical Therapy Treatment  Patient Details  Name: Sylvia Taylor MRN: [id number] Date of Birth: 21-Jun-1970 Referring Provider (PT): Ignacia Bayley PA-C   Encounter Date: 11/30/2020   PT End of Session - 11/30/20 1803     Visit Number 10    Number of Visits 12    Date for PT Re-Evaluation 12/05/20    PT Start Time 0445    PT Stop Time 0530    PT Time Calculation (min) 45 min    Activity Tolerance Patient tolerated treatment well    Behavior During Therapy [facility] for tasks assessed/performed             Past Medical History:  Diagnosis Date   Allergy    GERD (gastroesophageal reflux disease)    Hypothyroidism    PONV (postoperative nausea and vomiting)     Past Surgical History:  Procedure Laterality Date   BIOPSY  10/27/2020   Procedure: BIOPSY;  Surgeon: Carol Ada, MD;  [facility];  Service: Endoscopy;;   BREAST BIOPSY Left 05/27/2013   CESAREAN SECTION     CHOLECYSTECTOMY N/A 05/11/2020   Procedure: LAPAROSCOPIC CHOLECYSTECTOMY;  Surgeon: Coralie Keens, MD;  Location: [place];  Service: General;  Laterality: N/A;   COLONOSCOPY WITH PROPOFOL N/A 03/24/2020   Procedure: COLONOSCOPY WITH PROPOFOL;  Surgeon: Carol Ada, MD;  [facility];  Service: Endoscopy;  Laterality: N/A;   ESOPHAGOGASTRODUODENOSCOPY (EGD) WITH PROPOFOL N/A 10/27/2020   Procedure: ESOPHAGOGASTRODUODENOSCOPY (EGD) WITH PROPOFOL;  Surgeon: Carol Ada, MD;  [facility];  Service: Endoscopy;  Laterality: N/A;   KNEE ARTHROSCOPY Right 09/2014   LIVER BIOPSY N/A 05/11/2020   Procedure: LIVER BIOPSY;  Surgeon: Coralie Keens, MD;  Location: [place];  Service: General;  Laterality: N/A;   POLYPECTOMY  03/24/2020   Procedure: POLYPECTOMY;  Surgeon: Carol Ada, MD;  [facility];  Service: Endoscopy;;     There were no vitals filed for this visit.   Subjective Assessment - 11/30/20 1803     Subjective COVID-19 screen performed prior to patient entering clinic.  No pain.    Pertinent History DM, hypothyroidism, right knee scope.    Currently in Pain? No/denies    Pain Score 0-No pain                               [facility] Adult PT Treatment/Exercise - 11/30/20 0001       Exercises   Exercises Knee/Hip      Lumbar Exercises: Aerobic   Nustep Level 6 x 10 minutes.      Moist Heat Therapy   Number Minutes Moist Heat 10 Minutes    Moist Heat Location Lumbar Spine      Manual Therapy   Manual Therapy Joint mobilization;Soft tissue mobilization    Soft tissue mobilization In supine: Right femoral traction with sustained holds and oscilliations as well as belt mobs to right hip f/b STW/M to left low back, SIJ and upper gluteal region (20 minutes total).                          PT Long Term Goals - 10/03/20 1755       PT LONG TERM GOAL #1   Title Independent with a HEP.    Time 6    Period  Weeks    Status New      PT LONG TERM GOAL #2   Title Stand 45 minutes with pain not > 3/10.    Time 6    Period Weeks    Status New      PT LONG TERM GOAL #3   Title Sleep 6 hours undisturbed.    Time 6    Period Weeks    Status New                   Plan - 11/30/20 1806     Clinical Impression Statement Very good response to last treatment and today's.  No pain complaints pre and post treatment.    Personal Factors and Comorbidities Comorbidity 1;Other    Comorbidities DM, hypothyroidism, right knee scope.    Examination-Activity Limitations Stand;Other    Examination-Participation Restrictions Other    Stability/Clinical Decision Making Stable/Uncomplicated    Rehab Potential Excellent    PT Frequency 2x / week    PT Duration 6 weeks    PT Treatment/Interventions ADLs/Self Care Home Management;Cryotherapy;Electrical  Stimulation;Ultrasound;Moist Heat;Functional mobility training;Therapeutic activities;Therapeutic exercise;Manual techniques;Patient/family education;Passive range of motion;Dry needling    PT Next Visit Plan Dry needling and left femoral traction.    Consulted and Agree with Plan of Care Patient             Patient will benefit from skilled therapeutic intervention in order to improve the following deficits and impairments:  Pain, Decreased activity tolerance, Increased muscle spasms  Visit Diagnosis: Chronic left-sided low back pain, unspecified whether sciatica present     Problem List Patient Active Problem List   Diagnosis Date Noted   Severe obesity (BMI >= 40) ([place]) 06/06/2014   Fluid retention in legs 06/02/2013   Hypothyroidism 11/03/2012   Allergic rhinitis 11/03/2012    Sylvia Taylor, PT 11/30/2020, 6:09 PM  [facility] [address] [place], [place], [address] Phone: [phone]   Fax:  [phone]  Name: RENNEE Taylor MRN: [id number] Date of Birth: Jun 17, 1970

## 2020-12-07 ENCOUNTER — Ambulatory Visit: Payer: BC Managed Care – PPO | Admitting: Physical Therapy

## 2020-12-07 ENCOUNTER — Other Ambulatory Visit: Payer: Self-pay

## 2020-12-07 DIAGNOSIS — G8929 Other chronic pain: Secondary | ICD-10-CM

## 2020-12-07 DIAGNOSIS — M545 Low back pain, unspecified: Secondary | ICD-10-CM | POA: Diagnosis not present

## 2020-12-07 NOTE — Therapy (Signed)
[facility] [place], [place], [address] Phone: [phone]   Fax:  [phone]  Physical Therapy Treatment  Patient Details  Name: Sylvia Taylor MRN: [id number] Date of Birth: April 04, 1970 Referring Provider (PT): Ignacia Bayley PA-C   Encounter Date: 12/07/2020   PT End of Session - 12/07/20 1728     Visit Number 11    Number of Visits 12    Date for PT Re-Evaluation 12/05/20    PT Start Time 0400    PT Stop Time 2585    PT Time Calculation (min) 50 min    Activity Tolerance Patient tolerated treatment well    Behavior During Therapy [facility] for tasks assessed/performed             Past Medical History:  Diagnosis Date   Allergy    GERD (gastroesophageal reflux disease)    Hypothyroidism    PONV (postoperative nausea and vomiting)     Past Surgical History:  Procedure Laterality Date   BIOPSY  10/27/2020   Procedure: BIOPSY;  Surgeon: Carol Ada, MD;  [facility];  Service: Endoscopy;;   BREAST BIOPSY Left 05/27/2013   CESAREAN SECTION     CHOLECYSTECTOMY N/A 05/11/2020   Procedure: LAPAROSCOPIC CHOLECYSTECTOMY;  Surgeon: Coralie Keens, MD;  Location: [place];  Service: General;  Laterality: N/A;   COLONOSCOPY WITH PROPOFOL N/A 03/24/2020   Procedure: COLONOSCOPY WITH PROPOFOL;  Surgeon: Carol Ada, MD;  [facility];  Service: Endoscopy;  Laterality: N/A;   ESOPHAGOGASTRODUODENOSCOPY (EGD) WITH PROPOFOL N/A 10/27/2020   Procedure: ESOPHAGOGASTRODUODENOSCOPY (EGD) WITH PROPOFOL;  Surgeon: Carol Ada, MD;  [facility];  Service: Endoscopy;  Laterality: N/A;   KNEE ARTHROSCOPY Right 09/2014   LIVER BIOPSY N/A 05/11/2020   Procedure: LIVER BIOPSY;  Surgeon: Coralie Keens, MD;  Location: [place];  Service: General;  Laterality: N/A;   POLYPECTOMY  03/24/2020   Procedure: POLYPECTOMY;  Surgeon: Carol Ada, MD;  [facility];  Service: Endoscopy;;     There were no vitals filed for this visit.   Subjective Assessment - 12/07/20 1729     Subjective COVID-19 screen performed prior to patient entering clinic.  No pain since last visit.    Pertinent History DM, hypothyroidism, right knee scope.    Currently in Pain? No/denies                               [facility] Adult PT Treatment/Exercise - 12/07/20 0001       Exercises   Exercises Knee/Hip      Lumbar Exercises: Aerobic   Nustep Level 6 x 12 minutes.      Modalities   Modalities Moist Heat      Moist Heat Therapy   Number Minutes Moist Heat 10 Minutes    Moist Heat Location Lumbar Spine      Manual Therapy   Soft tissue mobilization In supine:  Left femoral traction and oscilliations and gentle belt mobs to patient's left hip (8 minutes) and STW/M x 11 minutes to patient's left SIJ region (19 minutes total).                          PT Long Term Goals - 10/03/20 1755       PT LONG TERM GOAL #1   Title Independent with a HEP.    Time 6    Period Weeks  Status New      PT LONG TERM GOAL #2   Title Stand 45 minutes with pain not > 3/10.    Time 6    Period Weeks    Status New      PT LONG TERM GOAL #3   Title Sleep 6 hours undisturbed.    Time 6    Period Weeks    Status New                   Plan - 12/07/20 1732     Clinical Impression Statement No pain since last treatment.  Patient has one reamining treatment.  She has responded very well to femoral traction and gentle mobs to her left hip.    Personal Factors and Comorbidities Comorbidity 1;Other    Comorbidities DM, hypothyroidism, right knee scope.    Examination-Activity Limitations Stand;Other    Stability/Clinical Decision Making Stable/Uncomplicated    Rehab Potential Excellent    PT Frequency 2x / week    PT Duration 6 weeks    PT Treatment/Interventions ADLs/Self Care Home Management;Cryotherapy;Electrical Stimulation;Ultrasound;Moist  Heat;Functional mobility training;Therapeutic activities;Therapeutic exercise;Manual techniques;Patient/family education;Passive range of motion;Dry needling    PT Next Visit Plan Left femoral traction and gentle belt mobs to left hip.    Consulted and Agree with Plan of Care Patient             Patient will benefit from skilled therapeutic intervention in order to improve the following deficits and impairments:  Pain, Decreased activity tolerance, Increased muscle spasms  Visit Diagnosis: Chronic left-sided low back pain, unspecified whether sciatica present     Problem List Patient Active Problem List   Diagnosis Date Noted   Severe obesity (BMI >= 40) ([place]) 06/06/2014   Fluid retention in legs 06/02/2013   Hypothyroidism 11/03/2012   Allergic rhinitis 11/03/2012    Sylvia Taylor, [place], PT 12/07/2020, 5:36 PM  [facility] [address] [place], [place], [address] Phone: [phone]   Fax:  [phone]  Name: Sylvia Taylor MRN: [id number] Date of Birth: 1970-06-13

## 2020-12-14 ENCOUNTER — Other Ambulatory Visit: Payer: Self-pay

## 2020-12-14 ENCOUNTER — Ambulatory Visit: Payer: BC Managed Care – PPO | Attending: Orthopaedic Surgery | Admitting: Physical Therapy

## 2020-12-14 DIAGNOSIS — G8929 Other chronic pain: Secondary | ICD-10-CM | POA: Insufficient documentation

## 2020-12-14 DIAGNOSIS — M545 Low back pain, unspecified: Secondary | ICD-10-CM | POA: Diagnosis present

## 2020-12-14 NOTE — Therapy (Addendum)
[facility] [place], [place], [address] Phone: [phone]   Fax:  [phone]  Physical Therapy Treatment  Patient Details  Name: Sylvia Taylor MRN: [id number] Date of Birth: 1970-04-19 Referring Provider (PT): Ignacia Bayley PA-C   Encounter Date: 12/14/2020   PT End of Session - 12/14/20 1725     Visit Number 12    Number of Visits 12    Date for PT Re-Evaluation 12/14/20    PT Start Time 0443    PT Stop Time 0523    PT Time Calculation (min) 40 min    Activity Tolerance Patient tolerated treatment well    Behavior During Therapy [facility] for tasks assessed/performed             Past Medical History:  Diagnosis Date   Allergy    GERD (gastroesophageal reflux disease)    Hypothyroidism    PONV (postoperative nausea and vomiting)     Past Surgical History:  Procedure Laterality Date   BIOPSY  10/27/2020   Procedure: BIOPSY;  Surgeon: Carol Ada, MD;  [facility];  Service: Endoscopy;;   BREAST BIOPSY Left 05/27/2013   CESAREAN SECTION     CHOLECYSTECTOMY N/A 05/11/2020   Procedure: LAPAROSCOPIC CHOLECYSTECTOMY;  Surgeon: Coralie Keens, MD;  Location: [place];  Service: General;  Laterality: N/A;   COLONOSCOPY WITH PROPOFOL N/A 03/24/2020   Procedure: COLONOSCOPY WITH PROPOFOL;  Surgeon: Carol Ada, MD;  [facility];  Service: Endoscopy;  Laterality: N/A;   ESOPHAGOGASTRODUODENOSCOPY (EGD) WITH PROPOFOL N/A 10/27/2020   Procedure: ESOPHAGOGASTRODUODENOSCOPY (EGD) WITH PROPOFOL;  Surgeon: Carol Ada, MD;  [facility];  Service: Endoscopy;  Laterality: N/A;   KNEE ARTHROSCOPY Right 09/2014   LIVER BIOPSY N/A 05/11/2020   Procedure: LIVER BIOPSY;  Surgeon: Coralie Keens, MD;  Location: [place];  Service: General;  Laterality: N/A;   POLYPECTOMY  03/24/2020   Procedure: POLYPECTOMY;  Surgeon: Carol Ada, MD;  [facility];  Service: Endoscopy;;     There were no vitals filed for this visit.   Subjective Assessment - 12/14/20 1723     Subjective COVID-19 screen performed prior to patient entering clinic.  Continued no pain.  Felt a mild twinge after doing a lot of stairs but good now.    Pertinent History DM, hypothyroidism, right knee scope.    Currently in Pain? Other (Comment)                               [facility] Adult PT Treatment/Exercise - 12/14/20 0001       Exercises   Exercises Knee/Hip      Lumbar Exercises: Aerobic   Nustep Level 5 x 10 minutes.      Manual Therapy   Soft tissue mobilization In supine:  Left femoral traction with oscilliations and belt mobs to right hip (7 minutes) f/b STW/M x 14 minutes to patient's left low back and SIJ.                          PT Long Term Goals - 12/14/20 1727       PT LONG TERM GOAL #1   Title Independent with a HEP.    Time 6    Period Weeks    Status Achieved      PT LONG TERM GOAL #2   Title Stand 45 minutes with pain not > 3/10.  Time 6    Period Weeks    Status Achieved      PT LONG TERM GOAL #3   Title Sleep 6 hours undisturbed.    Time 6    Period Weeks    Status Achieved                   Plan - 12/14/20 1726     Clinical Impression Statement See Discharge Summary.    Personal Factors and Comorbidities Comorbidity 1;Other    Comorbidities DM, hypothyroidism, right knee scope.    Examination-Activity Limitations Stand;Other    Examination-Participation Restrictions Other    Stability/Clinical Decision Making Stable/Uncomplicated    Rehab Potential Excellent    PT Frequency 2x / week    PT Duration 6 weeks    PT Treatment/Interventions ADLs/Self Care Home Management;Cryotherapy;Electrical Stimulation;Ultrasound;Moist Heat;Functional mobility training;Therapeutic activities;Therapeutic exercise;Manual techniques;Patient/family education;Passive range of motion;Dry needling    PT Next Visit Plan  Left femoral traction and gentle belt mobs to left hip.    Consulted and Agree with Plan of Care Patient             Patient will benefit from skilled therapeutic intervention in order to improve the following deficits and impairments:  Pain, Decreased activity tolerance, Increased muscle spasms  Visit Diagnosis: Chronic left-sided low back pain, unspecified whether sciatica present     Problem List Patient Active Problem List   Diagnosis Date Noted   Severe obesity (BMI >= 40) ([name]) 06/06/2014   Fluid retention in legs 06/02/2013   Hypothyroidism 11/03/2012   Allergic rhinitis 11/03/2012    PHYSICAL THERAPY DISCHARGE SUMMARY  Visits from Start of Care: 12.  Current functional level related to goals / functional outcomes: See above.   Remaining deficits: All goals met.   Education / Equipment: HEP.   Patient agrees to discharge. Patient goals were met. Patient is being discharged due to meeting the stated rehab goals.    Sylvia Taylor, Sylvia Taylor, PT 12/14/2020, 5:29 PM  [facility] [address] [place], [place], [address] Phone: [phone]   Fax:  [phone]  Name: Sylvia Taylor MRN: [id number] Date of Birth: 1970/03/02

## 2021-03-08 ENCOUNTER — Ambulatory Visit: Payer: BC Managed Care – PPO | Admitting: Nurse Practitioner

## 2021-03-13 ENCOUNTER — Encounter: Payer: Self-pay | Admitting: Nurse Practitioner

## 2021-03-13 ENCOUNTER — Ambulatory Visit: Payer: BC Managed Care – PPO | Admitting: Nurse Practitioner

## 2021-03-13 VITALS — BP 114/78 | HR 79 | Temp 97.5°F | Resp 20 | Ht 65.0 in | Wt 354.0 lb

## 2021-03-13 DIAGNOSIS — M5432 Sciatica, left side: Secondary | ICD-10-CM

## 2021-03-13 MED ORDER — PREDNISONE 20 MG PO TABS: 40.0000 mg | ORAL_TABLET | Freq: Every day | ORAL | 0 refills | Status: AC

## 2021-03-13 MED ORDER — METHYLPREDNISOLONE ACETATE 40 MG/ML IJ SUSP
80.0000 mg | Freq: Once | INTRAMUSCULAR | Status: AC
  Administered 2021-03-13: 80 mg via INTRAMUSCULAR

## 2021-03-13 MED ORDER — KETOROLAC TROMETHAMINE 60 MG/2ML IM SOLN
60.0000 mg | Freq: Once | INTRAMUSCULAR | Status: AC
  Administered 2021-03-13: 60 mg via INTRAMUSCULAR

## 2021-03-13 MED ORDER — METHYLPREDNISOLONE ACETATE 80 MG/ML IJ SUSP: 80.0000 mg | Freq: Once | INTRAMUSCULAR | Status: DC

## 2021-03-13 NOTE — Patient Instructions (Signed)

## 2021-03-13 NOTE — Addendum Note (Signed)
Addended by: Chevis Pretty on: 03/13/2021 09:01 AM   Modules accepted: Orders

## 2021-03-13 NOTE — Progress Notes (Signed)
Subjective:    Patient ID: Sylvia Taylor, female    DOB: April 20, 1970, 51 y.o.   MRN: [id number]   Chief Complaint: Left side thigh pain (Shooting down calf and muscle is twitching/)   HPI Patient comes in today c/o left leg pain. Started about 3 weeks ago. Is on left side of thigh and radiates down her leg. Standing makes it better. She has occasional twitching on left lower leg muscle. Rates pain 1/10. Says herleft leg feels heavy  with burring sensation down lateral side of left calf. She has tried heating pad, biofreeze and an occasional aleve, with not much relief.    Review of Systems  Constitutional:  Negative for diaphoresis.  Eyes:  Negative for pain.  Respiratory:  Negative for shortness of breath.   Cardiovascular:  Negative for chest pain, palpitations and leg swelling.  Gastrointestinal:  Negative for abdominal pain.  Endocrine: Negative for polydipsia.  Skin:  Negative for rash.  Neurological:  Negative for dizziness, weakness and headaches.  Hematological:  Does not bruise/bleed easily.  All other systems reviewed and are negative.     Objective:   Physical Exam Vitals and nursing note reviewed.  Constitutional:      General: She is not in acute distress.    Appearance: Normal appearance. She is well-developed.  Neck:     Vascular: No carotid bruit or JVD.  Cardiovascular:     Rate and Rhythm: Normal rate and regular rhythm.     Heart sounds: Normal heart sounds.  Pulmonary:     Effort: Pulmonary effort is normal. No respiratory distress.     Breath sounds: Normal breath sounds. No wheezing or rales.  Chest:     Chest wall: No tenderness.  Abdominal:     General: Bowel sounds are normal. There is no distension or abdominal bruit.     Palpations: Abdomen is soft. There is no hepatomegaly, splenomegaly, mass or pulsatile mass.     Tenderness: There is no abdominal tenderness.  Musculoskeletal:        General: Normal range of motion.     Cervical back:  Normal range of motion and neck supple.     Comments: O left lower ext edema No point tenderness left leg FROM of left knee and hiip   Lymphadenopathy:     Cervical: No cervical adenopathy.  Skin:    General: Skin is warm and dry.  Neurological:     Mental Status: She is alert and oriented to person, place, and time.     Deep Tendon Reflexes: Reflexes are normal and symmetric.  Psychiatric:        Behavior: Behavior normal.        Thought Content: Thought content normal.        Judgment: Judgment normal.   BP 114/78    Pulse 79    Temp (!) 97.5 F (36.4 C) (Temporal)    Resp 20    Ht 5\' 5"  (1.651 m)    Wt (!) 354 lb (160.6 kg)    LMP 09/13/2015    SpO2 100%    BMI 58.91 kg/m         Assessment & Plan:   [name] in today with chief complaint of Left side thigh pain (Shooting down calf and muscle is twitching/)   1. Sciatica of left side Rest exercise - ketorolac (TORADOL) injection 60 mg - methylPREDNISolone acetate (DEPO-MEDROL) injection 80 mg - predniSONE (DELTASONE) 20 MG tablet; Take 2 tablets (40  mg total) by mouth daily with breakfast for 5 days. 2 po daily for 5 days  Dispense: 10 tablet; Refill: 0    The above assessment and management plan was discussed with the patient. The patient verbalized understanding of and has agreed to the management plan. Patient is aware to call the clinic if symptoms persist or worsen. Patient is aware when to return to the clinic for a follow-up visit. Patient educated on when it is appropriate to go to the emergency department.   Mary-Margaret Hassell Done, FNP

## 2021-04-02 ENCOUNTER — Telehealth: Payer: BC Managed Care – PPO | Admitting: Physician Assistant

## 2021-04-02 DIAGNOSIS — J019 Acute sinusitis, unspecified: Secondary | ICD-10-CM

## 2021-04-02 DIAGNOSIS — B9689 Other specified bacterial agents as the cause of diseases classified elsewhere: Secondary | ICD-10-CM

## 2021-04-02 MED ORDER — AMOXICILLIN-POT CLAVULANATE 875-125 MG PO TABS: 1.0000 | ORAL_TABLET | Freq: Two times a day (BID) | ORAL | 0 refills | Status: DC

## 2021-04-02 NOTE — Progress Notes (Signed)

## 2021-04-30 ENCOUNTER — Ambulatory Visit: Payer: BC Managed Care – PPO | Admitting: Family Medicine

## 2021-04-30 ENCOUNTER — Encounter: Payer: Self-pay | Admitting: Family Medicine

## 2021-04-30 DIAGNOSIS — H66009 Acute suppurative otitis media without spontaneous rupture of ear drum, unspecified ear: Secondary | ICD-10-CM | POA: Diagnosis not present

## 2021-04-30 DIAGNOSIS — J020 Streptococcal pharyngitis: Secondary | ICD-10-CM | POA: Diagnosis not present

## 2021-04-30 MED ORDER — AMOXICILLIN-POT CLAVULANATE 875-125 MG PO TABS: 1.0000 | ORAL_TABLET | Freq: Two times a day (BID) | ORAL | 0 refills | Status: DC

## 2021-04-30 NOTE — Progress Notes (Signed)
? ? ?Subjective:  ? ? Patient ID: Sylvia Taylor, female    DOB: 23-Sep-1970, 51 y.o.   MRN: [id number] ? ? ?HPI: ?Sylvia Taylor is a 51 y.o. female presenting for Congestion starting 3 days ago. Lethargic yesterday. Pain in throat  really raw. Can swallow liquid & food. Right ear pain radiating down the neck. Fever, feels hot. Chills yesterday. 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  ? ? ? ?Depression screen [facility] 2/9 03/13/2021 06/15/2020 10/11/2019 05/21/2019 03/19/2019  ?Decreased Interest 0 0 0 0 0  ?Down, Depressed, Hopeless 0 0 0 0 0  ?PHQ - 2 Score 0 0 0 0 0  ?Altered sleeping 0 - - - -  ?Tired, decreased energy 0 - - - -  ?Change in appetite 0 - - - -  ?Feeling bad or failure about yourself  0 - - - -  ?Trouble concentrating 0 - - - -  ?Moving slowly or fidgety/restless 0 - - - -  ?Suicidal thoughts 0 - - - -  ?PHQ-9 Score 0 - - - -  ?Difficult doing work/chores Not difficult at all - - - -  ? ? ? ?Relevant past medical, surgical, family and social history reviewed and updated as indicated.  ?Interim medical history since our last visit reviewed. ?Allergies and medications reviewed and updated. ? ?ROS:  ?Review of Systems  ?Constitutional:  Negative for activity change, appetite change, chills and fever.  ?HENT:  Positive for congestion, postnasal drip, rhinorrhea and sinus pressure. Negative for ear discharge, ear pain, hearing loss, nosebleeds, sneezing and trouble swallowing.   ?Respiratory:  Negative for chest tightness and shortness of breath.   ?Cardiovascular:  Negative for chest pain and palpitations.  ?Skin:  Negative for rash.   ? ?Social History  ? ?Tobacco Use  ?Smoking Status Never  ?Smokeless Tobacco Never  ? ? ?   ?Objective:  ?  ? ?Wt Readings from Last 3 Encounters:  ?03/13/21 (!) 354 lb (160.6 kg)  ?10/27/20 (!) 345 lb 0.3 oz (156.5 kg)  ?09/07/20 (!) 349 lb 9.6 oz (158.6 kg)  ?  ? ?Exam deferred. Pt. Harbor[place] due to COVID 19. Phone visit performed.  ? ?Assessment & Plan:  ? ?1. Acute suppurative  otitis media without spontaneous rupture of ear drum, recurrence not specified, unspecified laterality   ?2. Strep pharyngitis   ? ? ?Meds ordered this encounter  ?Medications  ? amoxicillin-clavulanate (AUGMENTIN) 875-125 MG tablet  ?  Sig: Take 1 tablet by mouth 2 (two) times daily. Take all of this medication  ?  Dispense:  20 tablet  ?  Refill:  0  ? ? ?No orders of the defined types were placed in this encounter. ? ?  ? ?Diagnoses and all orders for this visit: ? ?Acute suppurative otitis media without spontaneous rupture of ear drum, recurrence not specified, unspecified laterality ? ?Strep pharyngitis ? ?Other orders ?-     amoxicillin-clavulanate (AUGMENTIN) 875-125 MG tablet; Take 1 tablet by mouth 2 (two) times daily. Take all of this medication ? ? ? ?Virtual Visit via telephone Note ? ?I discussed the limitations, risks, security and privacy concerns of performing an evaluation and management service by telephone and the availability of in person appointments. The patient was identified with two identifiers. Pt.expressed understanding and agreed to proceed. Pt. Is at home. Dr. Livia Snellen is in his office. ? ?Follow Up Instructions: ?  ?I discussed the assessment and treatment plan with the patient. The patient was provided an  opportunity to ask questions and all were answered. The patient agreed with the plan and demonstrated an understanding of the instructions. ?  ?The patient was advised to call back or seek an in-person evaluation if the symptoms worsen or if the condition fails to improve as anticipated. ? ? ?Total minutes including chart review and phone contact time: 13 ? ? ?Follow up plan: ?Return if symptoms worsen or fail to improve. ? ?Claretta Fraise, MD ?[facility] ? ?

## 2021-06-12 ENCOUNTER — Other Ambulatory Visit: Payer: Self-pay | Admitting: Nurse Practitioner

## 2021-06-15 ENCOUNTER — Ambulatory Visit: Payer: BC Managed Care – PPO | Admitting: Nurse Practitioner

## 2021-06-15 ENCOUNTER — Encounter: Payer: Self-pay | Admitting: Nurse Practitioner

## 2021-06-15 VITALS — BP 144/93 | HR 78 | Temp 97.1°F | Resp 20 | Ht 65.0 in | Wt 369.0 lb

## 2021-06-15 DIAGNOSIS — E034 Atrophy of thyroid (acquired): Secondary | ICD-10-CM

## 2021-06-15 NOTE — Progress Notes (Addendum)
[place]   Subjective:    Patient ID: Sylvia Taylor, female    DOB: 1970-06-16, 51 y.o.   MRN: [id number]   Chief Complaint: medical management of chronic issues     HPI:  Sylvia Taylor is a 51 y.o. who identifies as a female who was assigned female at birth.   Social history: Lives with: husband  Work history: Education officer, museum   Comes in today for follow up of the following chronic medical issues:  1. Hypothyroidism due to acquired atrophy of thyroid No problems that she is aware of. Lab Results  Component Value Date   TSH 4.470 06/15/2020     2. Severe obesity (BMI >= 40) (HCC) Weight is up 15lbs. Par=tinet has been on many fad diets as well as weight watchers. They all seem to work for a short period of time then she stops losing and wil gain weight. This has been a struggle her entire life. I am worried that she is going  to get diabetes. Wt Readings from Last 3 Encounters:  06/15/21 (!) 369 lb (167.4 kg)  03/13/21 (!) 354 lb (160.6 kg)  10/27/20 (!) 345 lb 0.3 oz (156.5 kg)   BMI Readings from Last 3 Encounters:  06/15/21 61.40 kg/m  03/13/21 58.91 kg/m  10/27/20 57.41 kg/m      New complaints: None today  No Known Allergies Outpatient Encounter Medications as of 06/15/2021  Medication Sig   amoxicillin-clavulanate (AUGMENTIN) 875-125 MG tablet Take 1 tablet by mouth 2 (two) times daily. Take all of this medication   cetirizine (ZYRTEC) 10 MG tablet Take 10 mg by mouth daily as needed for allergies.   fluticasone (FLONASE) 50 MCG/ACT nasal spray Place 1 spray into both nostrils as needed for allergies or rhinitis.   levothyroxine (SYNTHROID) 75 MCG tablet Take 1 tablet (75 mcg total) by mouth daily. (NEEDS TO BE SEEN BEFORE NEXT REFILL)   omeprazole (PRILOSEC) 40 MG capsule Take 40 mg by mouth every morning.   Probiotic Product (PROBIOTIC-10 PO) Take 1 capsule by mouth daily.   No facility-administered encounter medications on file as of 06/15/2021.     Past Surgical History:  Procedure Laterality Date   BIOPSY  10/27/2020   Procedure: BIOPSY;  Surgeon: Carol Ada, MD;  [facility];  Service: Endoscopy;;   BREAST BIOPSY Left 05/27/2013   CESAREAN SECTION     CHOLECYSTECTOMY N/A 05/11/2020   Procedure: LAPAROSCOPIC CHOLECYSTECTOMY;  Surgeon: Coralie Keens, MD;  Location: [place];  Service: General;  Laterality: N/A;   COLONOSCOPY WITH PROPOFOL N/A 03/24/2020   Procedure: COLONOSCOPY WITH PROPOFOL;  Surgeon: Carol Ada, MD;  [facility];  Service: Endoscopy;  Laterality: N/A;   ESOPHAGOGASTRODUODENOSCOPY (EGD) WITH PROPOFOL N/A 10/27/2020   Procedure: ESOPHAGOGASTRODUODENOSCOPY (EGD) WITH PROPOFOL;  Surgeon: Carol Ada, MD;  [facility];  Service: Endoscopy;  Laterality: N/A;   KNEE ARTHROSCOPY Right 09/2014   LIVER BIOPSY N/A 05/11/2020   Procedure: LIVER BIOPSY;  Surgeon: Coralie Keens, MD;  Location: [place];  Service: General;  Laterality: N/A;   POLYPECTOMY  03/24/2020   Procedure: POLYPECTOMY;  Surgeon: Carol Ada, MD;  [facility];  Service: Endoscopy;;    Family History  Problem Relation Age of Onset   Hypertension Father    Sleep apnea Father    Cancer Maternal Grandmother        ovarian and thyroid cancer      Controlled substance contract: n/a     Review of Systems  Constitutional:  Negative for diaphoresis.  Eyes:  Negative for pain.  Respiratory:  Negative for shortness of breath.   Cardiovascular:  Negative for chest pain, palpitations and leg swelling.  Gastrointestinal:  Negative for abdominal pain.  Endocrine: Negative for polydipsia.  Skin:  Negative for rash.  Neurological:  Negative for dizziness, weakness and headaches.  Hematological:  Does not bruise/bleed easily.  All other systems reviewed and are negative.     Objective:   Physical Exam Vitals and nursing note reviewed.  Constitutional:      General: She is not in acute  distress.    Appearance: Normal appearance. She is well-developed.  HENT:     Head: Normocephalic.     Right Ear: Tympanic membrane normal.     Left Ear: Tympanic membrane normal.     Nose: Nose normal.     Mouth/Throat:     Mouth: Mucous membranes are moist.  Eyes:     Pupils: Pupils are equal, round, and reactive to light.  Neck:     Vascular: No carotid bruit or JVD.  Cardiovascular:     Rate and Rhythm: Normal rate and regular rhythm.     Heart sounds: Normal heart sounds.  Pulmonary:     Effort: Pulmonary effort is normal. No respiratory distress.     Breath sounds: Normal breath sounds. No wheezing or rales.  Chest:     Chest wall: No tenderness.  Abdominal:     General: Bowel sounds are normal. There is no distension or abdominal bruit.     Palpations: Abdomen is soft. There is no hepatomegaly, splenomegaly, mass or pulsatile mass.     Tenderness: There is no abdominal tenderness.  Musculoskeletal:        General: Normal range of motion.     Cervical back: Normal range of motion and neck supple.  Lymphadenopathy:     Cervical: No cervical adenopathy.  Skin:    General: Skin is warm and dry.  Neurological:     Mental Status: She is alert and oriented to person, place, and time.     Deep Tendon Reflexes: Reflexes are normal and symmetric.  Psychiatric:        Behavior: Behavior normal.        Thought Content: Thought content normal.        Judgment: Judgment normal.    BP (!) 144/93   Pulse 78   Temp (!) 97.1 F (36.2 C) (Temporal)   Resp 20   Ht '5\' 5"'  (1.651 m)   Wt (!) 369 lb (167.4 kg)   LMP 09/13/2015   SpO2 96%   BMI 61.40 kg/m        Assessment & Plan:   Sylvia Taylor comes in today with chief complaint of Recheck thyroid   Diagnosis and orders addressed:  1. Hypothyroidism due to acquired atrophy of thyroid Labs pending - CBC with Differential/Platelet - CMP14+EGFR - Thyroid Panel With TSH  2. Severe obesity (BMI >= 40) (HCC) Exercise  to lose weight Drink lots of water Discussed weight loss and wegovy   Labs pending Health Maintenance reviewed Diet and exercise encouraged  Follow up plan: 1 month   [place], FNP

## 2021-06-15 NOTE — Patient Instructions (Signed)
Semaglutide Injection (Weight Management) ?What is this medication? ?SEMAGLUTIDE (SEM a GLOO tide) promotes weight loss. It may also be used to maintain weight loss. It works by decreasing appetite. Changes to diet and exercise are often combined with this medication. ?This medicine may be used for other purposes; ask your health care provider or pharmacist if you have questions. ?COMMON BRAND NAME(S): Wegovy ?What should I tell my care team before I take this medication? ?They need to know if you have any of these conditions: ?Endocrine tumors (MEN 2) or if someone in your family had these tumors ?Eye disease, vision problems ?Gallbladder disease ?History of depression or mental health disease ?History of pancreatitis ?Kidney disease ?Stomach or intestine problems ?Suicidal thoughts, plans, or attempt; a previous suicide attempt by you or a family member ?Thyroid cancer or if someone in your family had thyroid cancer ?An unusual or allergic reaction to semaglutide, other medications, foods, dyes, or preservatives ?Pregnant or trying to get pregnant ?Breast-feeding ?How should I use this medication? ?This medication is injected under the skin. You will be taught how to prepare and give it. Take it as directed on the prescription label. It is given once every week (every 7 days). Keep taking it unless your care team tells you to stop. ?It is important that you put your used needles and pens in a special sharps container. Do not put them in a trash can. If you do not have a sharps container, call your pharmacist or care team to get one. ?A special MedGuide will be given to you by the pharmacist with each prescription and refill. Be sure to read this information carefully each time. ?This medication comes with INSTRUCTIONS FOR USE. Ask your pharmacist for directions on how to use this medication. Read the information carefully. Talk to your pharmacist or care team if you have questions. ?Talk to your care team about  the use of this medication in children. While it may be prescribed for children as young as 12 years for selected conditions, precautions do apply. ?Overdosage: If you think you have taken too much of this medicine contact a poison control center or emergency room at once. ?NOTE: This medicine is only for you. Do not share this medicine with others. ?What if I miss a dose? ?If you miss a dose and the next scheduled dose is more than 2 days away, take the missed dose as soon as possible. If you miss a dose and the next scheduled dose is less than 2 days away, do not take the missed dose. Take the next dose at your regular time. Do not take double or extra doses. If you miss your dose for 2 weeks or more, take the next dose at your regular time or call your care team to talk about how to restart this medication. ?What may interact with this medication? ?Insulin and other medications for diabetes ?This list may not describe all possible interactions. Give your health care provider a list of all the medicines, herbs, non-prescription drugs, or dietary supplements you use. Also tell them if you smoke, drink alcohol, or use illegal drugs. Some items may interact with your medicine. ?What should I watch for while using this medication? ?Visit your care team for regular checks on your progress. It may be some time before you see the benefit from this medication. ?Drink plenty of fluids while taking this medication. Check with your care team if you have severe diarrhea, nausea, and vomiting, or if you sweat a   lot. The loss of too much body fluid may make it dangerous for you to take this medication. ?This medication may affect blood sugar levels. Ask your care team if changes in diet or medications are needed if you have diabetes. ?If you or your family notice any changes in your behavior, such as new or worsening depression, thoughts of harming yourself, anxiety, other unusual or disturbing thoughts, or memory loss, call  your care team right away. ?Women should inform their care team if they wish to become pregnant or think they might be pregnant. Losing weight while pregnant is not advised and may cause harm to the unborn child. Talk to your care team for more information. ?What side effects may I notice from receiving this medication? ?Side effects that you should report to your care team as soon as possible: ?Allergic reactions--skin rash, itching, hives, swelling of the face, lips, tongue, or throat ?Change in vision ?Dehydration--increased thirst, dry mouth, feeling faint or lightheaded, headache, dark yellow or brown urine ?Gallbladder problems--severe stomach pain, nausea, vomiting, fever ?Heart palpitations--rapid, pounding, or irregular heartbeat ?Kidney injury--decrease in the amount of urine, swelling of the ankles, hands, or feet ?Pancreatitis--severe stomach pain that spreads to your back or gets worse after eating or when touched, fever, nausea, vomiting ?Thoughts of suicide or self-harm, worsening mood, feelings of depression ?Thyroid cancer--new mass or lump in the neck, pain or trouble swallowing, trouble breathing, hoarseness ?Side effects that usually do not require medical attention (report to your care team if they continue or are bothersome): ?Diarrhea ?Loss of appetite ?Nausea ?Stomach pain ?Vomiting ?This list may not describe all possible side effects. Call your doctor for medical advice about side effects. You may report side effects to FDA at 1-800-FDA-1088. ?Where should I keep my medication? ?Keep out of the reach of children and pets. ?Refrigeration (preferred): Store in the refrigerator. Do not freeze. Keep this medication in the original container until you are ready to take it. Get rid of any unused medication after the expiration date. ?Room temperature: If needed, prior to cap removal, the pen can be stored at room temperature for up to 28 days. Protect from light. If it is stored at room  temperature, get rid of any unused medication after 28 days or after it expires, whichever is first. ?It is important to get rid of the medication as soon as you no longer need it or it is expired. You can do this in two ways: ?Take the medication to a medication take-back program. Check with your pharmacy or law enforcement to find a location. ?If you cannot return the medication, follow the directions in the MedGuide. ?NOTE: This sheet is a summary. It may not cover all possible information. If you have questions about this medicine, talk to your doctor, pharmacist, or health care provider. ?? 2023 Elsevier/Gold Standard (2021-02-14 00:00:00) ? ?

## 2021-06-16 LAB — CBC WITH DIFFERENTIAL/PLATELET
Basophils Absolute: 0.1 10*3/uL (ref 0.0–0.2)
Basos: 1 %
EOS (ABSOLUTE): 0.2 10*3/uL (ref 0.0–0.4)
Eos: 2 %
Hematocrit: 41.1 % (ref 34.0–46.6)
Hemoglobin: 13.7 g/dL (ref 11.1–15.9)
Immature Grans (Abs): 0 10*3/uL (ref 0.0–0.1)
Immature Granulocytes: 0 %
Lymphocytes Absolute: 2.5 10*3/uL (ref 0.7–3.1)
Lymphs: 28 %
MCH: 27.7 pg (ref 26.6–33.0)
MCHC: 33.3 g/dL (ref 31.5–35.7)
MCV: 83 fL (ref 79–97)
Monocytes Absolute: 0.5 10*3/uL (ref 0.1–0.9)
Monocytes: 6 %
Neutrophils Absolute: 5.6 10*3/uL (ref 1.4–7.0)
Neutrophils: 63 %
Platelets: 233 10*3/uL (ref 150–450)
RBC: 4.95 x10E6/uL (ref 3.77–5.28)
RDW: 12.9 % (ref 11.7–15.4)
WBC: 8.9 10*3/uL (ref 3.4–10.8)

## 2021-06-16 LAB — CMP14+EGFR
ALT: 37 IU/L — ABNORMAL HIGH (ref 0–32)
AST: 23 IU/L (ref 0–40)
Albumin/Globulin Ratio: 1.5 (ref 1.2–2.2)
Albumin: 4.5 g/dL (ref 3.8–4.8)
Alkaline Phosphatase: 96 IU/L (ref 44–121)
BUN/Creatinine Ratio: 20 (ref 9–23)
BUN: 13 mg/dL (ref 6–24)
Bilirubin Total: 0.3 mg/dL (ref 0.0–1.2)
CO2: 22 mmol/L (ref 20–29)
Calcium: 9.5 mg/dL (ref 8.7–10.2)
Chloride: 102 mmol/L (ref 96–106)
Creatinine, Ser: 0.65 mg/dL (ref 0.57–1.00)
Globulin, Total: 3.1 g/dL (ref 1.5–4.5)
Glucose: 96 mg/dL (ref 70–99)
Potassium: 4.3 mmol/L (ref 3.5–5.2)
Sodium: 140 mmol/L (ref 134–144)
Total Protein: 7.6 g/dL (ref 6.0–8.5)
eGFR: 109 mL/min/{1.73_m2} (ref >59)

## 2021-06-16 LAB — THYROID PANEL WITH TSH
Free Thyroxine Index: 2.7 (ref 1.2–4.9)
T3 Uptake Ratio: 29 % (ref 24–39)
T4, Total: 9.2 ug/dL (ref 4.5–12.0)
TSH: 3.45 u[IU]/mL (ref 0.450–4.500)

## 2021-07-06 MED ORDER — WEGOVY 0.25 MG/0.5ML ~~LOC~~ SOAJ: 0.2500 mg | SUBCUTANEOUS | 1 refills | Status: DC

## 2021-07-10 ENCOUNTER — Other Ambulatory Visit: Payer: Self-pay | Admitting: Nurse Practitioner

## 2021-07-10 ENCOUNTER — Telehealth: Payer: Self-pay | Admitting: *Deleted

## 2021-07-10 NOTE — Telephone Encounter (Signed)
Ardyce Meeuwsen (Key[employer]y Seller) Rx #: [id number] AESLPN 0.'25MG'$ /0.5ML auto-injectors  Sent to plan

## 2021-07-10 NOTE — Telephone Encounter (Signed)
Jheri Winger ([employer] Seller) - [phone] HALPFX 0.'25MG'$ /0.5ML auto-injectors Status: PA Response - Denied Created: May 26th, 2023 [phone] Sent: May 30th, 2023  You do not meet the requirements of your plan. Your plan covers this drug when you meet the following conditions: - You have participated in a comprehensive weight management program - You did this for 6 months prior to using drug therapy Your request has been denied based on the information we have

## 2021-07-10 NOTE — Telephone Encounter (Signed)
Help please. She has the state teachers plan and they usually pay for wegovy- she really needs it.

## 2021-07-11 ENCOUNTER — Other Ambulatory Visit: Payer: Self-pay | Admitting: Nurse Practitioner

## 2021-07-11 NOTE — Telephone Encounter (Signed)
The newer requirements are that she must do lifestyle modifications 6 months prior to starting wegovy and have this documented:  - You have participated in a comprehensive weight management program - You did this for 6 months prior to using drug therapy  If you we can prove this, it will be an easy fix.  I will resubmit PA---just let me know!

## 2021-07-13 ENCOUNTER — Ambulatory Visit: Payer: BC Managed Care – PPO | Admitting: Nurse Practitioner

## 2021-07-16 ENCOUNTER — Other Ambulatory Visit: Payer: Self-pay | Admitting: *Deleted

## 2021-07-16 ENCOUNTER — Encounter: Payer: Self-pay | Admitting: *Deleted

## 2021-07-25 ENCOUNTER — Ambulatory Visit: Payer: BC Managed Care – PPO | Admitting: Nurse Practitioner

## 2021-07-25 ENCOUNTER — Encounter: Payer: Self-pay | Admitting: Nurse Practitioner

## 2021-07-25 VITALS — BP 131/83 | HR 81 | Temp 97.2°F | Resp 20 | Ht 65.0 in | Wt 362.0 lb

## 2021-07-25 DIAGNOSIS — R202 Paresthesia of skin: Secondary | ICD-10-CM

## 2021-07-25 MED ORDER — PREDNISONE 20 MG PO TABS: 40.0000 mg | ORAL_TABLET | Freq: Every day | ORAL | 0 refills | Status: AC

## 2021-07-25 MED ORDER — SAXENDA 18 MG/3ML ~~LOC~~ SOPN: 0.6000 mg | PEN_INJECTOR | Freq: Every day | SUBCUTANEOUS | 1 refills | Status: DC

## 2021-07-25 NOTE — Telephone Encounter (Signed)
Appeal process for Sylvia Taylor has dragged on without any confirmation of success or failure.  The point is mute as low dose Sylvia Taylor is on back order until September. Perhaps an alternative can be prescribed.

## 2021-07-25 NOTE — Progress Notes (Signed)
Subjective:    Patient ID: Sylvia Taylor, female    DOB: [birth year], 51 y.o.   MRN: [id number]   Chief Complaint: pressure to sinus area and Sinus area is numb and tingly   HPI Patient says that she has had a lot of pressure in her face for several weeks. Has been treated for sinus infection 2 x in the last few months. She has a triangle shaped area around nose and face that feels numb. Almost like it has been injected with lidocaine. The episodes are interment lasting about 10 minutes. Feels like skin is crawling  in scalp at times. Started about 2 weeks ago and occurs daily.    Trying to get insurance approval for wegovy- it is backorder right now anyway. Will try saxenda    Review of Systems  Constitutional:  Negative for diaphoresis.  Eyes:  Negative for pain.  Respiratory:  Negative for shortness of breath.   Cardiovascular:  Negative for chest pain, palpitations and leg swelling.  Gastrointestinal:  Negative for abdominal pain.  Endocrine: Negative for polydipsia.  Skin:  Negative for rash.  Neurological:  Negative for dizziness, weakness and headaches.  Hematological:  Does not bruise/bleed easily.  All other systems reviewed and are negative.      Objective:   Physical Exam Vitals reviewed.  Constitutional:      Appearance: Normal appearance. She is obese.  HENT:     Right Ear: Tympanic membrane normal.     Left Ear: Tympanic membrane normal.     Nose: Nose normal.     Mouth/Throat:     Mouth: Mucous membranes are moist.  Cardiovascular:     Rate and Rhythm: Normal rate and regular rhythm.     Heart sounds: Normal heart sounds.  Pulmonary:     Effort: Pulmonary effort is normal.     Breath sounds: Normal breath sounds.  Skin:    General: Skin is warm.  Neurological:     General: No focal deficit present.     Mental Status: She is alert.  Psychiatric:        Mood and Affect: Mood normal.        Behavior: Behavior normal.     BP 131/83   Pulse 81    Temp (!) 97.2 F (36.2 C) (Temporal)   Resp 20   Ht '5\' 5"'$  (1.651 m)   Wt (!) 362 lb (164.2 kg)   LMP 09/13/2015   SpO2 96%   BMI 60.24 kg/m        Assessment & Plan:   Sylvia Taylor in today with chief complaint of pressure to sinus area and Sinus area is numb and tingly   1. Facial tingling sensation Flonase daily Report any worsening - predniSONE (DELTASONE) 20 MG tablet; Take 2 tablets (40 mg total) by mouth daily with breakfast for 5 days. 2 po daily for 5 days  Dispense: 10 tablet; Refill: 0  2. Severe obesity (BMI >= 40) (HCC) Discussed diet and exercise for person with BMI >25 Will recheck weight in 3-6 months  - Liraglutide -Weight Management (SAXENDA) 18 MG/3ML SOPN; Inject 0.6 mg into the skin daily.  Dispense: 3 mL; Refill: 1    The above assessment and management plan was discussed with the patient. The patient verbalized understanding of and has agreed to the management plan. Patient is aware to call the clinic if symptoms persist or worsen. Patient is aware when to return to the clinic for a follow-up visit.  Patient educated on when it is appropriate to go to the emergency department.   Mary-Margaret Hassell Done, FNP

## 2021-07-26 ENCOUNTER — Telehealth: Payer: Self-pay | Admitting: *Deleted

## 2021-07-26 NOTE — Telephone Encounter (Signed)
Approved  Faxed approval letter to pharmacy  Contacted and notified patient

## 2021-07-26 NOTE — Telephone Encounter (Signed)
OLINDA NOLA (Key: [id number]) PA - [phone] Saxenda '18MG'$ Fayne Mediate pen-injectors Status: Sent: June 15th, 2023

## 2021-08-06 ENCOUNTER — Other Ambulatory Visit: Payer: Self-pay | Admitting: Nurse Practitioner

## 2021-08-06 DIAGNOSIS — Z1231 Encounter for screening mammogram for malignant neoplasm of breast: Secondary | ICD-10-CM

## 2021-08-08 ENCOUNTER — Ambulatory Visit
Admission: RE | Admit: 2021-08-08 | Discharge: 2021-08-08 | Disposition: A | Discharge status: Home / Self Care | Payer: BC Managed Care – PPO | Source: Ambulatory Visit | Attending: Nurse Practitioner | Admitting: Nurse Practitioner

## 2021-08-08 DIAGNOSIS — Z1231 Encounter for screening mammogram for malignant neoplasm of breast: Secondary | ICD-10-CM

## 2021-08-09 ENCOUNTER — Other Ambulatory Visit: Payer: Self-pay | Admitting: Nurse Practitioner

## 2021-08-13 ENCOUNTER — Ambulatory Visit: Payer: BC Managed Care – PPO | Admitting: Orthopedic Surgery

## 2021-08-13 ENCOUNTER — Encounter: Payer: Self-pay | Admitting: Orthopedic Surgery

## 2021-08-13 DIAGNOSIS — G5602 Carpal tunnel syndrome, left upper limb: Secondary | ICD-10-CM

## 2021-08-13 DIAGNOSIS — M7712 Lateral epicondylitis, left elbow: Secondary | ICD-10-CM

## 2021-08-13 MED ORDER — METHYLPREDNISOLONE ACETATE 40 MG/ML IJ SUSP
40.0000 mg | INTRAMUSCULAR | Status: AC | PRN
  Administered 2021-08-13: 40 mg via INTRA_ARTICULAR

## 2021-08-13 MED ORDER — LIDOCAINE HCL 1 % IJ SOLN
2.0000 mL | INTRAMUSCULAR | Status: AC | PRN
  Administered 2021-08-13: 2 mL

## 2021-08-13 NOTE — Progress Notes (Signed)
Office Visit Note   Patient: Sylvia Taylor           Date of Birth: [birth year]           MRN: [id number] Visit Date: 08/13/2021              Requested by: Chevis Pretty, [place],   [id number] PCP: Chevis Pretty, FNP  Chief Complaint  Patient presents with   Left Forearm - Pain, Numbness      HPI: Patient is a 51 year old woman who presents with 2 problems.  Patient states she has had numbness in the left long finger.  Patient states this started last month.  Patient also has pain over the lateral epicondyle on the left.  Pain with resisted extension.  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 Lateral epicondylitis, left elbow   2. Carpal tunnel syndrome of left wrist     Plan: The lateral condyle was injected.  Discussed that if the long finger becomes more symptomatic we would need to get a nerve conduction study to evaluate the median nerve at the wrist.  Follow-Up Instructions: Return if symptoms worsen or fail to improve.   Ortho Exam  Patient is alert, oriented, no adenopathy, well-dressed, normal affect, normal respiratory effort. Examination of the left hand patient has a negative Phalen's negative Tinel's sign her motor strength is symmetric in both upper extremities patient has subjective tingling on the palmar aspect of the long finger.  Examination of the left elbow patient has pain to palpation over the origin of the extensor muscles at the lateral condyle.  Resisted extension of the wrist reproduces her pain.  There is no triggering of her fingers.  Imaging: No results found. No images are attached to the encounter.  Labs: Lab Results  Component Value Date   HGBA1C 5.7 05/15/2012     Lab Results  Component Value Date   ALBUMIN 4.5 06/15/2021   ALBUMIN 3.8 07/14/2020   ALBUMIN 4.4 03/19/2019    No results found for:  "MG" No results found for: "VD25OH"  No results found for: "PREALBUMIN"    Latest Ref Rng & Units 06/15/2021    3:56 PM 07/14/2020    3:03 PM 10/11/2019    3:22 PM  CBC EXTENDED  WBC 3.4 - 10.8 x10E3/uL 8.9  9.8  10.1   RBC 3.77 - 5.28 x10E6/uL 4.95  4.77  4.51   Hemoglobin 11.1 - 15.9 g/dL 13.7  13.0  12.7   HCT 34.0 - 46.6 % 41.1  40.7  38.3   Platelets 150 - 450 x10E3/uL 233  219  214   NEUT# 1.4 - 7.0 x10E3/uL 5.6  6.7  7.2   Lymph# 0.7 - 3.1 x10E3/uL 2.5  2.4  2.0      There is no height or weight on file to calculate BMI.  Orders:  No orders of the defined types were placed in this encounter.  No orders of the defined types were placed in this encounter.    Procedures: Medium Joint Inj: L lateral epicondyle on 08/13/2021 12:49 PM Indications: pain and diagnostic evaluation Details: 22 G 1.5 in needle, dorsal approach Medications: 2 mL lidocaine 1 %; 40 mg methylPREDNISolone acetate 40 MG/ML Outcome: tolerated well, no immediate complications Procedure, treatment alternatives, risks and benefits explained, specific risks discussed. Consent was given by the patient. Immediately prior  to procedure a time out was called to verify the correct patient, procedure, equipment, support staff and site/side marked as required. Patient was prepped and draped in the usual sterile fashion.      Clinical Data: No additional findings.  ROS:  All other systems negative, except as noted in the HPI. Review of Systems  Objective: Vital Signs: LMP 09/13/2015   Specialty Comments:  No specialty comments available.  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   Left knee pain 02/13/2018   Primary localized osteoarthritis of left knee  02/13/2018   Pneumonia due to influenza A virus 04/20/2017   Obstructive sleep apnea 04/20/2017   Acute hypoxemic respiratory failure ([place]) 03/06/2017   Primary osteoarthritis of right knee 02/27/2015   Status post arthroscopy of right knee 09/26/2014   Tear of medial meniscus of right knee, subsequent encounter 09/16/2014   Sprain of knee/leg, right, initial encounter 09/05/2014   Overweight 07/08/2014   Right knee pain 07/08/2014   Thyroid disorder 07/08/2014   Severe obesity (BMI >= 40) ([place]) 06/06/2014   Fluid retention in legs 06/02/2013   Hypothyroidism 11/03/2012   Allergic rhinitis 11/03/2012   Past Medical History:  Diagnosis Date   Allergy    GERD (gastroesophageal reflux disease)    Hypothyroidism    PONV (postoperative nausea and vomiting)     Family History  Problem Relation Age of Onset   Hypertension Father    Sleep apnea Father    Breast cancer Maternal Grandmother        29s   Cancer Maternal Grandmother        ovarian and thyroid cancer    Past Surgical History:  Procedure Laterality Date   BIOPSY  10/27/2020   Procedure: BIOPSY;  Surgeon: Carol Ada, MD;  Location: [facility];  Service: Endoscopy;;   BREAST BIOPSY Left 05/27/2013   CESAREAN SECTION     CHOLECYSTECTOMY N/A 05/11/2020   Procedure: LAPAROSCOPIC CHOLECYSTECTOMY;  Surgeon: Coralie Keens, MD;  Location: [facility];  Service: General;  Laterality: N/A;   COLONOSCOPY WITH PROPOFOL N/A 03/24/2020   Procedure: COLONOSCOPY WITH PROPOFOL;  Surgeon: Carol Ada, MD;  Location: [facility];  Service: Endoscopy;  Laterality: N/A;   ESOPHAGOGASTRODUODENOSCOPY (EGD) WITH PROPOFOL N/A 10/27/2020   Procedure: ESOPHAGOGASTRODUODENOSCOPY (EGD) WITH PROPOFOL;  Surgeon: Carol Ada, MD;  Location: [facility];  Service: Endoscopy;  Laterality: N/A;   KNEE ARTHROSCOPY Right 09/2014   LIVER BIOPSY N/A 05/11/2020   Procedure: LIVER BIOPSY;  Surgeon: Coralie Keens, MD;  Location: [facility];  Service:  General;  Laterality: N/A;   POLYPECTOMY  03/24/2020   Procedure: POLYPECTOMY;  Surgeon: Carol Ada, MD;  Location: [facility];  Service: Endoscopy;;   Social History   Occupational History   Not on file  Tobacco Use   Smoking status: Never   Smokeless tobacco: Never  Vaping Use   Vaping Use: Never used  Substance and Sexual Activity   Alcohol use: No   Drug use: No   Sexual activity: Yes    Birth control/protection: Post-menopausal

## 2021-10-05 ENCOUNTER — Ambulatory Visit: Payer: BC Managed Care – PPO | Admitting: Nurse Practitioner

## 2021-10-05 ENCOUNTER — Encounter: Payer: Self-pay | Admitting: Nurse Practitioner

## 2021-10-05 VITALS — BP 136/93 | HR 75 | Temp 96.5°F | Resp 20 | Ht 65.0 in | Wt 347.0 lb

## 2021-10-05 DIAGNOSIS — R519 Headache, unspecified: Secondary | ICD-10-CM

## 2021-10-05 DIAGNOSIS — F419 Anxiety disorder, unspecified: Secondary | ICD-10-CM

## 2021-10-05 DIAGNOSIS — M5432 Sciatica, left side: Secondary | ICD-10-CM

## 2021-10-05 MED ORDER — METHYLPREDNISOLONE ACETATE 80 MG/ML IJ SUSP
80.0000 mg | Freq: Once | INTRAMUSCULAR | Status: AC
  Administered 2021-10-05: 80 mg via INTRAMUSCULAR

## 2021-10-05 MED ORDER — ESCITALOPRAM OXALATE 10 MG PO TABS: 10.0000 mg | ORAL_TABLET | Freq: Every day | ORAL | 1 refills | Status: DC

## 2021-10-05 NOTE — Progress Notes (Signed)
Subjective:    Patient ID: Sylvia Taylor, female    DOB: 1970-06-12, 51 y.o.   MRN: [id number]   Chief Complaint: Headache, Stress, and Leg Pain (Left lower leg/)   Headache  Pertinent negatives include no abdominal pain, dizziness, eye pain or weakness.  Leg Pain    Patient come sin with several complaints: - Anxiety- feels anxious and on edge. She has felt this way for several months. Has never been on any meds for this.    10/05/2021    8:46 AM 07/25/2021    1:31 PM 06/15/2021    3:31 PM 03/13/2021    8:37 AM  GAD 7 : Generalized Anxiety Score  Nervous, Anxious, on Edge '3 1 2 1  '$ Control/stop worrying 0 0 1 0  Worry too much - different things 0 1 0 0  Trouble relaxing '3 1 1 '$ 0  Restless 0 0 0 0  Easily annoyed or irritable 3 0 1 1  Afraid - awful might happen 0 0 0 0  Total GAD 7 Score '9 3 5 2  '$ Anxiety Difficulty Somewhat difficult Not difficult at all Not difficult at all Not difficult at all    -headache- frequent and she feels it is coming from her anxiety. Had eye exam recently and it is not coming from that.  - left lower leg pain- mainly in back of thigh. Is worse when sitting. Rates pain 5/10. Feels funny mor so then pain.     Review of Systems  Constitutional:  Negative for diaphoresis.  Eyes:  Negative for pain.  Respiratory:  Negative for shortness of breath.   Cardiovascular:  Negative for chest pain, palpitations and leg swelling.  Gastrointestinal:  Negative for abdominal pain.  Endocrine: Negative for polydipsia.  Skin:  Negative for rash.  Neurological:  Positive for headaches. Negative for dizziness and weakness.  Hematological:  Does not bruise/bleed easily.  All other systems reviewed and are negative.      Objective:   Physical Exam Constitutional:      Appearance: She is well-developed. She is obese.  Cardiovascular:     Rate and Rhythm: Normal rate and regular rhythm.     Pulses: Normal pulses.     Heart sounds: Normal heart sounds.   Pulmonary:     Breath sounds: Normal breath sounds.  Skin:    General: Skin is warm.  Neurological:     General: No focal deficit present.     Mental Status: She is alert and oriented to person, place, and time.  Psychiatric:        Mood and Affect: Mood normal.        Behavior: Behavior normal.    BP (!) 136/93   Pulse 75   Temp (!) 96.5 F (35.8 C) (Temporal)   Resp 20   Ht '5\' 5"'$  (1.651 m)   Wt (!) 347 lb (157.4 kg)   LMP 09/13/2015   SpO2 98%   BMI 57.74 kg/m         Assessment & Plan:   GABRIANA WILMOTT in today with chief complaint of Headache, Stress, and Leg Pain (Left lower leg/)   1. Sciatica of left side Moist heat rest - methylPREDNISolone acetate (DEPO-MEDROL) injection 80 mg  2. Anxiety Stress management - escitalopram (LEXAPRO) 10 MG tablet; Take 1 tablet (10 mg total) by mouth daily.  Dispense: 90 tablet; Refill: 1  3. Nonintractable episodic headache, unspecified headache type Keep diary of headaches. If do not improve  with treating anxiety we will then readdress. Avoid caffeine    The above assessment and management plan was discussed with the patient. The patient verbalized understanding of and has agreed to the management plan. Patient is aware to call the clinic if symptoms persist or worsen. Patient is aware when to return to the clinic for a follow-up visit. Patient educated on when it is appropriate to go to the emergency department.   Mary-Margaret Hassell Done, FNP

## 2021-10-05 NOTE — Patient Instructions (Signed)
Sciatica  Sciatica is pain, weakness, tingling, or loss of feeling (numbness) along the sciatic nerve. The sciatic nerve starts in the lower back and goes down the back of each leg. Sciatica usually affects one side of the body. Sciatica usually goes away on its own or with treatment. Sometimes, sciatica may come back. What are the causes? This condition happens when the sciatic nerve is pinched or has pressure put on it. This may be caused by: A disk in between the bones of the spine bulging out too far (herniated disk). Changes in the spinal disks due to aging. A condition that affects a muscle in the butt. Extra bone growth near the sciatic nerve. A break (fracture) of the area between your hip bones (pelvis). Pregnancy. Tumor. This is rare. What increases the risk? You are more likely to develop this condition if you: Play sports that put pressure or stress on the spine. Have poor strength and ease of movement (flexibility). Have had a back injury or back surgery. Sit for long periods of time. Do activities that involve bending or lifting over and over again. Are very overweight (obese). What are the signs or symptoms? Symptoms can vary from mild to very bad. They may include: Any of these problems in the lower back, leg, hip, or butt: Mild tingling, loss of feeling, or dull aches. A burning feeling. Sharp pains. Loss of feeling in the back of the calf or the sole of the foot. Leg weakness. Very bad back pain that makes it hard to move. These symptoms may get worse when you cough, sneeze, or laugh. They may also get worse when you sit or stand for long periods of time. How is this treated? This condition often gets better without any treatment. However, treatment may include: Changing or cutting back on physical activity when you have pain. Exercising, including strengthening and stretching. Putting ice or heat on the affected area. Shots of medicines to relieve pain and  swelling or to relax your muscles. Surgery. Follow these instructions at home: Medicines Take over-the-counter and prescription medicines only as told by your doctor. Ask your doctor if you should avoid driving or using machines while you are taking your medicine. Managing pain     If told, put ice on the affected area. To do this: Put ice in a plastic bag. Place a towel between your skin and the bag. Leave the ice on for 20 minutes, 2-3 times a day. If your skin turns bright red, take off the ice right away to prevent skin damage. The risk of skin damage is higher if you cannot feel pain, heat, or cold. If told, put heat on the affected area. Do this as often as told by your doctor. Use the heat source that your doctor tells you to use, such as a moist heat pack or a heating pad. Place a towel between your skin and the heat source. Leave the heat on for 20-30 minutes. If your skin turns bright red, take off the heat right away to prevent burns. The risk of burns is higher if you cannot feel pain, heat, or cold. Activity  Return to your normal activities when your doctor says that it is safe. Avoid activities that make your symptoms worse. Take short rests during the day. When you rest for a long time, do some physical activity or stretching between periods of rest. Avoid sitting for a long time without moving. Get up and move around at least one time each   hour. Do exercises and stretches as told by your doctor. Do not lift anything that is heavier than 10 lb (4.5 kg). Avoid lifting heavy things even when you do not have symptoms. Avoid lifting heavy things over and over. When you lift objects, always lift in a way that is safe for your body. To do this, you should: Bend your knees. Keep the object close to your body. Avoid twisting. General instructions Stay at a healthy weight. Wear comfortable shoes that support your feet. Avoid wearing high heels. Avoid sleeping on a mattress  that is too soft or too hard. You might have less pain if you sleep on a mattress that is firm enough to support your back. Contact a doctor if: Your pain is not controlled by medicine. Your pain does not get better. Your pain gets worse. Your pain lasts longer than 4 weeks. You lose weight without trying. Get help right away if: You cannot control when you pee (urinate) or poop (have a bowel movement). You have weakness in any of these areas and it gets worse: Lower back. The area between your hip bones. Butt. Legs. You have redness or swelling of your back. You have a burning feeling when you pee. Summary Sciatica is pain, weakness, tingling, or loss of feeling (numbness) along the sciatic nerve. This may include the lower back, legs, hips, and butt. This condition happens when the sciatic nerve is pinched or has pressure put on it. Treatment often includes rest, exercise, medicines, and putting ice or heat on the affected area. This information is not intended to replace advice given to you by your health care provider. Make sure you discuss any questions you have with your health care provider. Document Revised: 05/07/2021 Document Reviewed: 05/07/2021 Elsevier Patient Education  2023 Elsevier Inc.  

## 2021-10-16 ENCOUNTER — Encounter: Payer: Self-pay | Admitting: Family

## 2021-10-16 ENCOUNTER — Ambulatory Visit: Payer: BC Managed Care – PPO | Admitting: Family

## 2021-10-16 DIAGNOSIS — M7712 Lateral epicondylitis, left elbow: Secondary | ICD-10-CM | POA: Diagnosis not present

## 2021-10-17 DIAGNOSIS — M7712 Lateral epicondylitis, left elbow: Secondary | ICD-10-CM

## 2021-10-17 MED ORDER — METHYLPREDNISOLONE ACETATE 40 MG/ML IJ SUSP
40.0000 mg | INTRAMUSCULAR | Status: AC | PRN
  Administered 2021-10-17: 40 mg via INTRA_ARTICULAR

## 2021-10-17 MED ORDER — LIDOCAINE HCL 1 % IJ SOLN
1.0000 mL | INTRAMUSCULAR | Status: AC | PRN
  Administered 2021-10-17: 1 mL

## 2021-10-17 NOTE — Progress Notes (Signed)
Office Visit Note   Patient: Sylvia Taylor           Date of Birth: 1970-10-21           MRN: [id number] Visit Date: 10/16/2021              Requested by: Chevis Pretty, [place],  [place] [address] PCP: Chevis Pretty, FNP  Chief Complaint  Patient presents with   Left Elbow - Pain, Follow-up      HPI: Patient is a 51 year old woman who presents today in follow-up primarily for the left elbow pain.  She does continue to have numbness in the left long finger however this has been ongoing for many years and is not troublesome to her today.  The elbow pain is laterally over the lateral epicondyles.  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   Plan: The lateral condyle was again injected.   Referred to physical therapy for lateral epicondylitis.  Also given a counterforce brace discussed use.  Follow-Up Instructions: Return in about 4 weeks (around 11/13/2021).   Ortho Exam  Patient is alert, oriented, no adenopathy, well-dressed, normal affect, normal respiratory effort. Examination of the left hand patient has a negative Phalen's negative Tinel's sign her motor strength is symmetric in both upper extremities patient has subjective tingling on the palmar aspect of the long finger.  Examination of the left elbow patient has pain to palpation over the origin of the extensor muscles at the lateral condyle.  Resisted extension of the wrist reproduces her pain.  There is no triggering of her fingers.  Imaging: No results found. No images are attached to the encounter.  Labs: Lab Results  Component Value Date   HGBA1C 5.7 05/15/2012     Lab Results  Component Value Date    ALBUMIN 4.5 06/15/2021   ALBUMIN 3.8 07/14/2020   ALBUMIN 4.4 03/19/2019    No results found for: "MG" No results found for: "VD25OH"  No results found for: "PREALBUMIN"    Latest Ref Rng & Units 06/15/2021    3:56 PM 07/14/2020    3:03 PM 10/11/2019    3:22 PM  CBC EXTENDED  WBC 3.4 - 10.8 x10E3/uL 8.9  9.8  10.1   RBC 3.77 - 5.28 x10E6/uL 4.95  4.77  4.51   Hemoglobin 11.1 - 15.9 g/dL 13.7  13.0  12.7   HCT 34.0 - 46.6 % 41.1  40.7  38.3   Platelets 150 - 450 x10E3/uL 233  219  214   NEUT# 1.4 - 7.0 x10E3/uL 5.6  6.7  7.2   Lymph# 0.7 - 3.1 x10E3/uL 2.5  2.4  2.0      There is no height or weight on file to calculate BMI.  Orders:  No orders of the defined types were placed in this encounter.  No orders of the defined types were placed in this encounter.    Procedures: Medium Joint Inj: L lateral epicondyle on 10/17/2021 6:04 AM Details: 1.5 in Medications: 1 mL lidocaine 1 %; 40 mg methylPREDNISolone acetate 40 MG/ML Outcome: tolerated well, no immediate complications     Clinical Data: No additional findings.  ROS:  All  other systems negative, except as noted in the HPI. Review of Systems  Objective: Vital Signs: LMP 09/13/2015   Specialty Comments:  No specialty comments available.  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   Left knee pain 02/13/2018   Primary localized osteoarthritis of left knee 02/13/2018   Pneumonia due to influenza A virus 04/20/2017   Obstructive sleep apnea 04/20/2017   Acute hypoxemic respiratory failure ([place]) 03/06/2017   Primary osteoarthritis of right knee 02/27/2015   Status post arthroscopy of right knee 09/26/2014   Tear of medial meniscus of right knee, subsequent encounter  09/16/2014   Sprain of knee/leg, right, initial encounter 09/05/2014   Overweight 07/08/2014   Right knee pain 07/08/2014   Thyroid disorder 07/08/2014   Severe obesity (BMI >= 40) ([place]) 06/06/2014   Fluid retention in legs 06/02/2013   Hypothyroidism 11/03/2012   Allergic rhinitis 11/03/2012   Past Medical History:  Diagnosis Date   Allergy    GERD (gastroesophageal reflux disease)    Hypothyroidism    PONV (postoperative nausea and vomiting)     Family History  Problem Relation Age of Onset   Hypertension Father    Sleep apnea Father    Breast cancer Maternal Grandmother        68s   Cancer Maternal Grandmother        ovarian and thyroid cancer    Past Surgical History:  Procedure Laterality Date   BIOPSY  10/27/2020   Procedure: BIOPSY;  Surgeon: Carol Ada, MD;  Location: [facility];  Service: Endoscopy;;   BREAST BIOPSY Left 05/27/2013   CESAREAN SECTION     CHOLECYSTECTOMY N/A 05/11/2020   Procedure: LAPAROSCOPIC CHOLECYSTECTOMY;  Surgeon: Coralie Keens, MD;  Location: [facility];  Service: General;  Laterality: N/A;   COLONOSCOPY WITH PROPOFOL N/A 03/24/2020   Procedure: COLONOSCOPY WITH PROPOFOL;  Surgeon: Carol Ada, MD;  Location: [facility];  Service: Endoscopy;  Laterality: N/A;   ESOPHAGOGASTRODUODENOSCOPY (EGD) WITH PROPOFOL N/A 10/27/2020   Procedure: ESOPHAGOGASTRODUODENOSCOPY (EGD) WITH PROPOFOL;  Surgeon: Carol Ada, MD;  Location: [facility];  Service: Endoscopy;  Laterality: N/A;   KNEE ARTHROSCOPY Right 09/2014   LIVER BIOPSY N/A 05/11/2020   Procedure: LIVER BIOPSY;  Surgeon: Coralie Keens, MD;  Location: [facility];  Service: General;  Laterality: N/A;   POLYPECTOMY  03/24/2020   Procedure: POLYPECTOMY;  Surgeon: Carol Ada, MD;  Location: [facility];  Service: Endoscopy;;   Social History   Occupational History   Not on file  Tobacco Use   Smoking status: Never   Smokeless tobacco: Never  Vaping Use   Vaping Use: Never used   Substance and Sexual Activity   Alcohol use: No   Drug use: No   Sexual activity: Yes    Birth control/protection: Post-menopausal

## 2021-10-19 ENCOUNTER — Other Ambulatory Visit: Payer: Self-pay

## 2021-10-19 ENCOUNTER — Emergency Department (HOSPITAL_BASED_OUTPATIENT_CLINIC_OR_DEPARTMENT_OTHER): Payer: BC Managed Care – PPO

## 2021-10-19 ENCOUNTER — Emergency Department (HOSPITAL_BASED_OUTPATIENT_CLINIC_OR_DEPARTMENT_OTHER)
Admission: EM | Admit: 2021-10-19 | Discharge: 2021-10-19 | Disposition: A | Discharge status: Home / Self Care | Payer: BC Managed Care – PPO | Attending: Emergency Medicine | Admitting: Emergency Medicine

## 2021-10-19 ENCOUNTER — Encounter (HOSPITAL_BASED_OUTPATIENT_CLINIC_OR_DEPARTMENT_OTHER): Payer: Self-pay | Admitting: Emergency Medicine

## 2021-10-19 DIAGNOSIS — R519 Headache, unspecified: Secondary | ICD-10-CM | POA: Insufficient documentation

## 2021-10-19 DIAGNOSIS — R079 Chest pain, unspecified: Secondary | ICD-10-CM | POA: Insufficient documentation

## 2021-10-19 DIAGNOSIS — R202 Paresthesia of skin: Secondary | ICD-10-CM | POA: Insufficient documentation

## 2021-10-19 LAB — CBC WITH DIFFERENTIAL/PLATELET
Abs Immature Granulocytes: 0.04 10*3/uL (ref 0.00–0.07)
Basophils Absolute: 0.1 10*3/uL (ref 0.0–0.1)
Basophils Relative: 1 %
Eosinophils Absolute: 0.1 10*3/uL (ref 0.0–0.5)
Eosinophils Relative: 1 %
HCT: 39.2 % (ref 36.0–46.0)
Hemoglobin: 12.7 g/dL (ref 12.0–15.0)
Immature Granulocytes: 0 %
Lymphocytes Relative: 17 %
Lymphs Abs: 1.8 10*3/uL (ref 0.7–4.0)
MCH: 27 pg (ref 26.0–34.0)
MCHC: 32.4 g/dL (ref 30.0–36.0)
MCV: 83.4 fL (ref 80.0–100.0)
Monocytes Absolute: 0.6 10*3/uL (ref 0.1–1.0)
Monocytes Relative: 6 %
Neutro Abs: 8 10*3/uL — ABNORMAL HIGH (ref 1.7–7.7)
Neutrophils Relative %: 75 %
Platelets: 204 10*3/uL (ref 150–400)
RBC: 4.7 MIL/uL (ref 3.87–5.11)
RDW: 13.2 % (ref 11.5–15.5)
WBC: 10.6 10*3/uL — ABNORMAL HIGH (ref 4.0–10.5)
nRBC: 0 % (ref 0.0–0.2)

## 2021-10-19 LAB — BASIC METABOLIC PANEL
Anion gap: 9 (ref 5–15)
BUN: 15 mg/dL (ref 6–20)
CO2: 28 mmol/L (ref 22–32)
Calcium: 9.2 mg/dL (ref 8.9–10.3)
Chloride: 102 mmol/L (ref 98–111)
Creatinine, Ser: 0.53 mg/dL (ref 0.44–1.00)
GFR, Estimated: >60 mL/min (ref >60)
Glucose, Bld: 102 mg/dL — ABNORMAL HIGH (ref 70–99)
Potassium: 3.9 mmol/L (ref 3.5–5.1)
Sodium: 139 mmol/L (ref 135–145)

## 2021-10-19 LAB — TROPONIN I (HIGH SENSITIVITY)
Troponin I (High Sensitivity): <2 ng/L (ref <18)
Troponin I (High Sensitivity): <2 ng/L (ref <18)

## 2021-10-19 LAB — CBG MONITORING, ED: Glucose-Capillary: 94 mg/dL (ref 70–99)

## 2021-10-19 MED ORDER — ONDANSETRON HCL 4 MG/2ML IJ SOLN
4.0000 mg | Freq: Once | INTRAMUSCULAR | Status: AC
  Administered 2021-10-19: 4 mg via INTRAVENOUS
  Filled 2021-10-19: qty 2

## 2021-10-19 NOTE — ED Provider Notes (Signed)
[facility] EMERGENCY DEPT Provider Note   CSN: [id number] Arrival date & time: 10/19/21  1222     History  Chief Complaint  Patient presents with   Chest Pain    JENAYA SAAR is a 51 y.o. female.  She is here with a complaint of some hot sensation in her chest that radiated up to her head leading to some tingling that occurred while she was at work today.  EMS was called and checked her vital signs and found her blood pressure to be elevated.  She said the tingling recurred again in the back of her head on the drive over here.  She denies any chest pain or shortness of breath.  She had nausea no vomiting.  No blurry vision double vision.  No weakness no gait difficulties.  She started Lexapro few weeks ago.  Non-smoker no history of cardiac disease.  Postmenopausal.  The history is provided by the patient.  Chest Pain Pain location:  Substernal area Pain quality: hot   Radiates to: Head. Pain severity:  Moderate Onset quality:  Sudden Progression:  Resolved Chronicity:  New Context: at rest   Relieved by:  None tried Worsened by:  Nothing Ineffective treatments:  None tried Associated symptoms: headache, nausea and numbness   Associated symptoms: no abdominal pain, no cough, no diaphoresis, no fever, no shortness of breath, no vomiting and no weakness   Risk factors: no coronary artery disease        Home Medications Prior to Admission medications   Medication Sig Start Date End Date Taking? Authorizing Provider  cetirizine (ZYRTEC) 10 MG tablet Take 10 mg by mouth daily as needed for allergies.    [provider]  escitalopram (LEXAPRO) 10 MG tablet Take 1 tablet (10 mg total) by mouth daily. 10/05/21   Hassell Done, Mary-Margaret, FNP  fluticasone (FLONASE) 50 MCG/ACT nasal spray Place 1 spray into both nostrils as needed for allergies or rhinitis.    [provider]  levothyroxine (SYNTHROID) 75 MCG tablet TAKE 1 TABLET BY MOUTH EVERY DAY  08/10/21   Hassell Done, Mary-Margaret, FNP  Liraglutide -Weight Management (SAXENDA) 18 MG/3ML SOPN Inject 0.6 mg into the skin daily. 07/25/21   Hassell Done, Mary-Margaret, FNP  omeprazole (PRILOSEC) 40 MG capsule Take 40 mg by mouth every morning. 02/25/21   [provider]  Probiotic Product (ALOE 62035 & PROBIOTICS PO) Probiotics    [provider]      Allergies    Amoxicillin    Review of Systems   Review of Systems  Constitutional:  Negative for diaphoresis and fever.  Respiratory:  Negative for cough and shortness of breath.   Cardiovascular:  Positive for chest pain.  Gastrointestinal:  Positive for nausea. Negative for abdominal pain and vomiting.  Neurological:  Positive for numbness and headaches. Negative for weakness.    Physical Exam Updated Vital Signs BP (!) 153/89 (BP Location: Right Arm)   Pulse 79   Temp 97.8 F (36.6 C)   Resp 12   LMP 09/13/2015   SpO2 99%  Physical Exam Vitals and nursing note reviewed.  Constitutional:      General: She is not in acute distress.    Appearance: Normal appearance. She is well-developed. She is obese.  HENT:     Head: Normocephalic and atraumatic.  Eyes:     Conjunctiva/sclera: Conjunctivae normal.  Cardiovascular:     Rate and Rhythm: Normal rate and regular rhythm.     Heart sounds: Normal heart sounds. No  murmur heard. Pulmonary:     Effort: Pulmonary effort is normal. No respiratory distress.     Breath sounds: Normal breath sounds.  Abdominal:     Palpations: Abdomen is soft.     Tenderness: There is no abdominal tenderness.  Musculoskeletal:     Cervical back: Neck supple.     Right lower leg: No tenderness. No edema.     Left lower leg: No tenderness. No edema.  Skin:    General: Skin is warm and dry.     Capillary Refill: Capillary refill takes less than 2 seconds.  Neurological:     General: No focal deficit present.     Mental Status: She is alert.     Cranial Nerves: No cranial nerve deficit.      Motor: No weakness.     ED Results / Procedures / Treatments   Labs (all labs ordered are listed, but only abnormal results are displayed) Labs Reviewed  BASIC METABOLIC PANEL - Abnormal; Notable for the following components:      Result Value   Glucose, Bld 102 (*)    All other components within normal limits  CBC WITH DIFFERENTIAL/PLATELET - Abnormal; Notable for the following components:   WBC 10.6 (*)    Neutro Abs 8.0 (*)    All other components within normal limits  CBG MONITORING, ED  TROPONIN I (HIGH SENSITIVITY)  TROPONIN I (HIGH SENSITIVITY)    EKG EKG Interpretation  Date/Time:  Friday October 19 2021 12:32:01 EDT Ventricular Rate:  77 PR Interval:  158 QRS Duration: 78 QT Interval:  404 QTC Calculation: 457 R Axis:   14 Text Interpretation: Normal sinus rhythm with sinus arrhythmia Nonspecific T wave abnormality Abnormal ECG When compared with ECG of 14-Jul-2020 14:58, Nonspecific T wave abnormality, improved in Inferior leads Confirmed by Aletta Edouard [id number]) on 10/19/2021 12:33:56 PM  Radiology CT Head Wo Contrast  Result Date: 10/19/2021 CLINICAL DATA:  Headache. EXAM: CT HEAD WITHOUT CONTRAST TECHNIQUE: Contiguous axial images were obtained from the base of the skull through the vertex without intravenous contrast. RADIATION DOSE REDUCTION: This exam was performed according to the departmental dose-optimization program which includes automated exposure control, adjustment of the mA and/or kV according to patient size and/or use of iterative reconstruction technique. COMPARISON:  None Available. FINDINGS: Brain: No evidence of acute infarction, hemorrhage, hydrocephalus, extra-axial collection or mass lesion/mass effect. Vascular: No hyperdense vessel or unexpected calcification. Skull: Normal. Negative for fracture or focal lesion. Sinuses/Orbits: No acute finding. Other: None. IMPRESSION: No acute intracranial abnormality seen. Electronically Signed   By:  Marijo Conception M.D.   On: 10/19/2021 13:29    Procedures Procedures    Medications Ordered in ED Medications  ondansetron (ZOFRAN) injection 4 mg (4 mg Intravenous Given 10/19/21 1308)    ED Course/ Medical Decision Making/ A&P                           Medical Decision Making Amount and/or Complexity of Data Reviewed Labs: ordered. Radiology: ordered.  Risk Prescription drug management.   This patient complains of chest pain, head tingling, nausea; this involves an extensive number of treatment Options and is a complaint that carries with it a high risk of complications and morbidity. The differential includes reflux, ACS, pneumonia, PE, stroke, bleed, anxiety  I ordered, reviewed and interpreted labs, which included CBC with mildly elevated white count normal hemoglobin, chemistries normal, troponins flat I ordered medication IV Zofran  and reviewed PMP when indicated. I ordered imaging studies which included head CT and I independently    visualized and interpreted imaging which showed no acute findings Additional history obtained from patient significant other Previous records obtained and reviewed in epic no recent admissions  Cardiac monitoring reviewed, normal sinus rhythm Social determinants considered, no significant barriers Critical Interventions: None  After the interventions stated above, I reevaluated the patient and found patient to be symptom-free Admission and further testing considered, her care is signed out to Dr. Alvino Chapel to follow-up on second troponin.  If this is negative she likely can be discharged to follow-up outpatient with her treatment team.  Return instructions discussed.         Final Clinical Impression(s) / ED Diagnoses Final diagnoses:  Nonspecific chest pain  Paresthesias    Rx / DC Orders ED Discharge Orders     None         Hayden Rasmussen, MD 10/19/21 1758

## 2021-10-19 NOTE — ED Triage Notes (Addendum)
Hot sensation in chest , made her flush , felt a heaviness in her chest and the tingling in her head   happened 1045 this am,  it did go away  now feels a pressure in her head

## 2021-10-19 NOTE — ED Provider Notes (Signed)
  Physical Exam  BP 116/80   Pulse 63   Temp 97.8 F (36.6 C)   Resp 12   LMP 09/13/2015   SpO2 95%   Physical Exam  Procedures  Procedures  ED Course / MDM    Medical Decision Making Amount and/or Complexity of Data Reviewed Labs: ordered. Radiology: ordered.  Risk Prescription drug management.   Received patient in signout.  Chest pain and paresthesias.  Work-up reassuring.  Pending second troponin which has resulted negative.  Will discharge home with outpatient follow-up as needed.       Davonna Belling, MD 10/19/21 [phone]

## 2021-10-19 NOTE — Discharge Instructions (Signed)
You were seen in the emergency department for some discomfort in your chest and tingling in your head.  You had blood work EKG and a CAT scan of your head that did not show an obvious explanation for your symptoms.  Please continue your regular medications and follow-up with your primary care doctor.  Return to the emergency department if any worsening or concerning symptoms

## 2021-12-06 ENCOUNTER — Telehealth: Payer: Self-pay

## 2021-12-06 NOTE — Telephone Encounter (Signed)
(  Key: [id number])  Saxenda '18MG'$ Fayne Mediate pen-injectors   Form Charity fundraiser PA Form (2017 [employer])

## 2021-12-07 NOTE — Telephone Encounter (Signed)
Your prior authorization for Sylvia Taylor has been approved! MORE INFO For eligible patients, copay assistance may be available. To learn more and be redirected to the [facility] website, click on the "More Info" button to the right. Please also note that you may need to schedule a follow-up visit with your patient prior to the expiration of this prior authorization, as updated patient weight may be required for reauthorization.  Message from plan: Your PA request has been approved. Additional information will be provided in the approval communication. (Message [id number])

## 2022-01-10 ENCOUNTER — Ambulatory Visit: Payer: BC Managed Care – PPO | Admitting: Nurse Practitioner

## 2022-01-31 ENCOUNTER — Ambulatory Visit: Payer: BC Managed Care – PPO | Admitting: Nurse Practitioner

## 2022-02-14 ENCOUNTER — Ambulatory Visit: Payer: BC Managed Care – PPO | Admitting: Nurse Practitioner

## 2022-02-14 ENCOUNTER — Encounter: Payer: Self-pay | Admitting: Nurse Practitioner

## 2022-02-14 VITALS — BP 142/85 | HR 85 | Temp 97.6°F | Resp 20 | Ht 65.0 in | Wt 371.0 lb

## 2022-02-14 DIAGNOSIS — E034 Atrophy of thyroid (acquired): Secondary | ICD-10-CM

## 2022-02-14 DIAGNOSIS — Z23 Encounter for immunization: Secondary | ICD-10-CM | POA: Diagnosis not present

## 2022-02-14 DIAGNOSIS — R600 Localized edema: Secondary | ICD-10-CM | POA: Diagnosis not present

## 2022-02-14 DIAGNOSIS — Z6841 Body Mass Index (BMI) 40.0 and over, adult: Secondary | ICD-10-CM

## 2022-02-14 DIAGNOSIS — G4733 Obstructive sleep apnea (adult) (pediatric): Secondary | ICD-10-CM | POA: Diagnosis not present

## 2022-02-14 DIAGNOSIS — F419 Anxiety disorder, unspecified: Secondary | ICD-10-CM

## 2022-02-14 DIAGNOSIS — F411 Generalized anxiety disorder: Secondary | ICD-10-CM | POA: Diagnosis not present

## 2022-02-14 MED ORDER — LEVOTHYROXINE SODIUM 75 MCG PO TABS: 75.0000 ug | ORAL_TABLET | Freq: Every day | ORAL | 1 refills | Status: DC

## 2022-02-14 MED ORDER — ESCITALOPRAM OXALATE 10 MG PO TABS: 10.0000 mg | ORAL_TABLET | Freq: Every day | ORAL | 1 refills | Status: DC

## 2022-02-14 NOTE — Patient Instructions (Signed)
Edema  Edema is an abnormal buildup of fluids in the body tissues and under the skin. Swelling of the legs, feet, and ankles is a common symptom that becomes more likely as you get older. Swelling is also common in looser tissues, such as around the eyes. Pressing on the area may make a temporary dent in your skin (pitting edema). This fluid may also accumulate in your lungs (pulmonary edema). There are many possible causes of edema. Eating too much salt (sodium) and being on your feet or sitting for a long time can cause edema in your legs, feet, and ankles. Common causes of edema include: Certain medical conditions, such as heart failure, liver or kidney disease, and cancer. Weak leg blood vessels. An injury. Pregnancy. Medicines. Being obese. Low protein levels in the blood. Hot weather may make edema worse. Edema is usually painless. Your skin may look swollen or shiny. Follow these instructions at home: Medicines Take over-the-counter and prescription medicines only as told by your health care provider. Your health care provider may prescribe a medicine to help your body get rid of extra water (diuretic). Take this medicine if you are told to take it. Eating and drinking Eat a low-salt (low-sodium) diet to reduce fluid as told by your health care provider. Sometimes, eating less salt may reduce swelling. Depending on the cause of your swelling, you may need to limit how much fluid you drink (fluid restriction). General instructions Raise (elevate) the injured area above the level of your heart while you are sitting or lying down. Do not sit still or stand for long periods of time. Do not wear tight clothing. Do not wear garters on your upper legs. Exercise your legs to get your circulation going. This helps to move the fluid back into your blood vessels, and it may help the swelling go down. Wear compression stockings as told by your health care provider. These stockings help to prevent  blood clots and reduce swelling in your legs. It is important that these are the correct size. These stockings should be prescribed by your health care provider to prevent possible injuries. If elastic bandages or wraps are recommended, use them as told by your health care provider. Contact a health care provider if: Your edema does not get better with treatment. You have heart, liver, or kidney disease and have symptoms of edema. You have sudden and unexplained weight gain. Get help right away if: You develop shortness of breath or chest pain. You cannot breathe when you lie down. You develop pain, redness, or warmth in the swollen areas. You have heart, liver, or kidney disease and suddenly get edema. You have a fever and your symptoms suddenly get worse. These symptoms may be an emergency. Get help right away. Call 911. Do not wait to see if the symptoms will go away. Do not drive yourself to the hospital. Summary Edema is an abnormal buildup of fluids in the body tissues and under the skin. Eating too much salt (sodium)and being on your feet or sitting for a long time can cause edema in your legs, feet, and ankles. Raise (elevate) the injured area above the level of your heart while you are sitting or lying down. Follow your health care provider's instructions about diet and how much fluid you can drink. This information is not intended to replace advice given to you by your health care provider. Make sure you discuss any questions you have with your health care provider. Document Revised: 10/02/2020 Document   Reviewed: 10/02/2020 Elsevier Patient Education  2023 Elsevier Inc.  

## 2022-02-14 NOTE — Progress Notes (Signed)
Subjective:    Patient ID: Sylvia Taylor, female    DOB: Feb 21, 1970, 52 y.o.   MRN: [id number]   Chief Complaint: Medical Management of Chronic Issues    HPI:  Sylvia Taylor is a 52 y.o. who identifies as a female who was assigned female at birth.   Social history: Lives with: husband  Work history: Education officer, museum   Comes in today for follow up of the following chronic medical issues:  1. Hypothyroidism due to acquired atrophy of thyroid No issues that aware of. Lab Results  Component Value Date   TSH 3.450 06/15/2021     2. Fluid retention in legs Is not on fluid pill. Gets better at night while sleeping. Denies any SOB  3. Obstructive sleep apnea Does not wear CPAP at night. Says she snores. Patient says she has never had a sleep study , so she is not sure where this dx came from. Says she sleeps well and feels rested in mornings.  4. GAD (generalized anxiety disorder) Was put on lexapro several months ago.. says that it has really helped her. Does not feel as on edge as she did.    02/14/2022   10:18 AM 10/05/2021    8:46 AM 07/25/2021    1:31 PM 06/15/2021    3:31 PM  GAD 7 : Generalized Anxiety Score  Nervous, Anxious, on Edge 0 _0 Control/stop worrying 0 0 0 1  Worry too much - different things 0 0 1 0  Trouble relaxing 0 _1 Restless 0 0 0 0  Easily annoyed or irritable 0 3 0 1  Afraid - awful might happen 0 0 0 0  Total GAD 7 Score 0 _2 Anxiety Difficulty Not difficult at all Somewhat difficult Not difficult at all Not difficult at all      5. Severe obesity (BMI >= 40) (HCC) Weight is up 23lbs Wt Readings from Last 3 Encounters:  02/14/22 (!) 371 lb (168.3 kg)  10/05/21 (!) 347 lb (157.4 kg)  07/25/21 (!) 362 lb (164.2 kg)   BMI Readings from Last 3 Encounters:  02/14/22 61.74 kg/m  10/05/21 57.74 kg/m  07/25/21 60.24 kg/m      New complaints: None  today  Allergies  Allergen Reactions   Amoxicillin Hives   Outpatient  Encounter Medications as of 02/14/2022  Medication Sig   cetirizine (ZYRTEC) 10 MG tablet Take 10 mg by mouth daily as needed for allergies.   escitalopram (LEXAPRO) 10 MG tablet Take 1 tablet (10 mg total) by mouth daily.   fluticasone (FLONASE) 50 MCG/ACT nasal spray Place 1 spray into both nostrils as needed for allergies or rhinitis.   levothyroxine (SYNTHROID) 75 MCG tablet TAKE 1 TABLET BY MOUTH EVERY DAY   omeprazole (PRILOSEC) 40 MG capsule Take 40 mg by mouth every morning.   Probiotic Product (ALOE 29937 & PROBIOTICS PO) Probiotics   [DISCONTINUED] Liraglutide -Weight Management (SAXENDA) 18 MG/3ML SOPN Inject 0.6 mg into the skin daily.   No facility-administered encounter medications on file as of 02/14/2022.    Past Surgical History:  Procedure Laterality Date   BIOPSY  10/27/2020   Procedure: BIOPSY;  Surgeon: Carol Ada, MD;  Location: [facility];  Service: Endoscopy;;   BREAST BIOPSY Left 05/27/2013   CESAREAN SECTION     CHOLECYSTECTOMY N/A 05/11/2020   Procedure: LAPAROSCOPIC CHOLECYSTECTOMY;  Surgeon: Coralie Keens, MD;  Location: [facility];  Service: General;  Laterality: N/A;  COLONOSCOPY WITH PROPOFOL N/A 03/24/2020   Procedure: COLONOSCOPY WITH PROPOFOL;  Surgeon: Carol Ada, MD;  Location: [facility];  Service: Endoscopy;  Laterality: N/A;   ESOPHAGOGASTRODUODENOSCOPY (EGD) WITH PROPOFOL N/A 10/27/2020   Procedure: ESOPHAGOGASTRODUODENOSCOPY (EGD) WITH PROPOFOL;  Surgeon: Carol Ada, MD;  Location: [facility];  Service: Endoscopy;  Laterality: N/A;   KNEE ARTHROSCOPY Right 09/2014   LIVER BIOPSY N/A 05/11/2020   Procedure: LIVER BIOPSY;  Surgeon: Coralie Keens, MD;  Location: [facility];  Service: General;  Laterality: N/A;   POLYPECTOMY  03/24/2020   Procedure: POLYPECTOMY;  Surgeon: Carol Ada, MD;  Location: [facility];  Service: Endoscopy;;    Family History  Problem Relation Age of Onset   Hypertension Father    Sleep apnea Father     Breast cancer Maternal Grandmother        67s   Cancer Maternal Grandmother        ovarian and thyroid cancer      Controlled substance contract: n/a     Review of Systems  Constitutional:  Negative for diaphoresis.  Eyes:  Negative for pain.  Respiratory:  Negative for shortness of breath.   Cardiovascular:  Negative for chest pain, palpitations and leg swelling.  Gastrointestinal:  Negative for abdominal pain.  Endocrine: Negative for polydipsia.  Skin:  Negative for rash.  Neurological:  Negative for dizziness, weakness and headaches.  Hematological:  Does not bruise/bleed easily.  All other systems reviewed and are negative.      Objective:   Physical Exam Vitals and nursing note reviewed.  Constitutional:      General: She is not in acute distress.    Appearance: Normal appearance. She is well-developed.  HENT:     Head: Normocephalic.     Right Ear: Tympanic membrane normal.     Left Ear: Tympanic membrane normal.     Nose: Nose normal.     Mouth/Throat:     Mouth: Mucous membranes are moist.  Eyes:     Pupils: Pupils are equal, round, and reactive to light.  Neck:     Vascular: No carotid bruit or JVD.  Cardiovascular:     Rate and Rhythm: Normal rate and regular rhythm.     Heart sounds: Normal heart sounds.  Pulmonary:     Effort: Pulmonary effort is normal. No respiratory distress.     Breath sounds: Normal breath sounds. No wheezing or rales.  Chest:     Chest wall: No tenderness.  Abdominal:     General: Bowel sounds are normal. There is no distension or abdominal bruit.     Palpations: Abdomen is soft. There is no hepatomegaly, splenomegaly, mass or pulsatile mass.     Tenderness: There is no abdominal tenderness.  Musculoskeletal:        General: Normal range of motion.     Cervical back: Normal range of motion and neck supple.  Lymphadenopathy:     Cervical: No cervical adenopathy.  Skin:    General: Skin is warm and dry.  Neurological:      Mental Status: She is alert and oriented to person, place, and time.     Deep Tendon Reflexes: Reflexes are normal and symmetric.  Psychiatric:        Behavior: Behavior normal.        Thought Content: Thought content normal.        Judgment: Judgment normal.     BP (!) 142/85   Pulse 85   Temp 97.6 F (36.4 C) (  Temporal)   Resp 20   Ht _0  (1.651 m)   Wt (!) 371 lb (168.3 kg)   LMP 09/13/2015   SpO2 96%   BMI 61.74 kg/m        Assessment & Plan:  SELENY ALLBRIGHT comes in today with chief complaint of Medical Management of Chronic Issues   Diagnosis and orders addressed:  1. Hypothyroidism due to acquired atrophy of thyroid Labs pending - levothyroxine (SYNTHROID) 75 MCG tablet; Take 1 tablet (75 mcg total) by mouth daily.  Dispense: 90 tablet; Refill: 1 - CBC with Differential/Platelet - CMP14+EGFR - Lipid panel - Thyroid Panel With TSH  2. Fluid retention in legs Elevate legs when sitting Increase water daily  3. Obstructive sleep apnea  4. GAD (generalized anxiety disorder) Stress management - escitalopram (LEXAPRO) 10 MG tablet; Take 1 tablet (10 mg total) by mouth daily.  Dispense: 90 tablet; Refill: 1  5. Severe obesity (BMI >= 40) (HCC) Discussed diet and exercise for person with BMI >25 Will recheck weight in 3-6 months     Labs pending Health Maintenance reviewed Diet and exercise encouraged  Follow up plan: 6 months   [place], FNP

## 2022-02-15 LAB — CBC WITH DIFFERENTIAL/PLATELET
Basophils Absolute: 0 10*3/uL (ref 0.0–0.2)
Basos: 1 %
EOS (ABSOLUTE): 0.1 10*3/uL (ref 0.0–0.4)
Eos: 1 %
Hematocrit: 38.9 % (ref 34.0–46.6)
Hemoglobin: 12.9 g/dL (ref 11.1–15.9)
Immature Grans (Abs): 0 10*3/uL (ref 0.0–0.1)
Immature Granulocytes: 0 %
Lymphocytes Absolute: 2.1 10*3/uL (ref 0.7–3.1)
Lymphs: 24 %
MCH: 27.3 pg (ref 26.6–33.0)
MCHC: 33.2 g/dL (ref 31.5–35.7)
MCV: 82 fL (ref 79–97)
Monocytes Absolute: 0.5 10*3/uL (ref 0.1–0.9)
Monocytes: 5 %
Neutrophils Absolute: 5.8 10*3/uL (ref 1.4–7.0)
Neutrophils: 69 %
Platelets: 207 10*3/uL (ref 150–450)
RBC: 4.73 x10E6/uL (ref 3.77–5.28)
RDW: 12.5 % (ref 11.7–15.4)
WBC: 8.5 10*3/uL (ref 3.4–10.8)

## 2022-02-15 LAB — CMP14+EGFR
ALT: 27 IU/L (ref 0–32)
AST: 18 IU/L (ref 0–40)
Albumin/Globulin Ratio: 1.3 (ref 1.2–2.2)
Albumin: 4.1 g/dL (ref 3.9–4.9)
Alkaline Phosphatase: 101 IU/L (ref 44–121)
BUN/Creatinine Ratio: 23 (ref 9–23)
BUN: 12 mg/dL (ref 6–24)
Bilirubin Total: 0.4 mg/dL (ref 0.0–1.2)
CO2: 24 mmol/L (ref 20–29)
Calcium: 9.3 mg/dL (ref 8.7–10.2)
Chloride: 99 mmol/L (ref 96–106)
Creatinine, Ser: 0.52 mg/dL — ABNORMAL LOW (ref 0.57–1.00)
Globulin, Total: 3.2 g/dL (ref 1.5–4.5)
Glucose: 94 mg/dL (ref 70–99)
Potassium: 4.1 mmol/L (ref 3.5–5.2)
Sodium: 137 mmol/L (ref 134–144)
Total Protein: 7.3 g/dL (ref 6.0–8.5)
eGFR: 114 mL/min/{1.73_m2} (ref >59)

## 2022-02-15 LAB — LIPID PANEL
Chol/HDL Ratio: 3.8 ratio (ref 0.0–4.4)
Cholesterol, Total: 174 mg/dL (ref 100–199)
HDL: 46 mg/dL (ref >39)
LDL Chol Calc (NIH): 111 mg/dL — ABNORMAL HIGH (ref 0–99)
Triglycerides: 90 mg/dL (ref 0–149)
VLDL Cholesterol Cal: 17 mg/dL (ref 5–40)

## 2022-02-15 LAB — THYROID PANEL WITH TSH
Free Thyroxine Index: 2.7 (ref 1.2–4.9)
T3 Uptake Ratio: 27 % (ref 24–39)
T4, Total: 10 ug/dL (ref 4.5–12.0)
TSH: 3.09 u[IU]/mL (ref 0.450–4.500)

## 2022-02-16 ENCOUNTER — Other Ambulatory Visit: Payer: Self-pay | Admitting: Nurse Practitioner

## 2022-02-16 DIAGNOSIS — E034 Atrophy of thyroid (acquired): Secondary | ICD-10-CM

## 2022-02-18 ENCOUNTER — Other Ambulatory Visit: Payer: Self-pay | Admitting: Nurse Practitioner

## 2022-02-18 DIAGNOSIS — E034 Atrophy of thyroid (acquired): Secondary | ICD-10-CM

## 2022-03-24 ENCOUNTER — Telehealth: Payer: BC Managed Care – PPO | Admitting: Physician Assistant

## 2022-03-24 DIAGNOSIS — R112 Nausea with vomiting, unspecified: Secondary | ICD-10-CM

## 2022-03-24 DIAGNOSIS — R6889 Other general symptoms and signs: Secondary | ICD-10-CM

## 2022-03-24 MED ORDER — OSELTAMIVIR PHOSPHATE 75 MG PO CAPS: 75.0000 mg | ORAL_CAPSULE | Freq: Two times a day (BID) | ORAL | 0 refills | Status: AC

## 2022-03-24 MED ORDER — ONDANSETRON HCL 4 MG PO TABS: 4.0000 mg | ORAL_TABLET | Freq: Three times a day (TID) | ORAL | 0 refills | Status: DC | PRN

## 2022-03-24 NOTE — Patient Instructions (Signed)
Tawni Carnes, thank you for joining [place], PA-C for today's virtual visit.  While this provider is not your primary care provider (PCP), if your PCP is located in our provider database this encounter information will be shared with them immediately following your visit.   [facility] account gives you access to today's visit and all your visits, tests, and labs performed at [facility] " click here if you don't have a [facility] account or go to [email or web address]  Consent: (Patient) Sylvia Taylor provided verbal consent for this virtual visit at the beginning of the encounter.  Current Medications:  Current Outpatient Medications:    ondansetron (ZOFRAN) 4 MG tablet, Take 1 tablet (4 mg total) by mouth every 8 (eight) hours as needed for nausea or vomiting., Disp: 20 tablet, Rfl: 0   oseltamivir (TAMIFLU) 75 MG capsule, Take 1 capsule (75 mg total) by mouth 2 (two) times daily for 5 days., Disp: 10 capsule, Rfl: 0   cetirizine (ZYRTEC) 10 MG tablet, Take 10 mg by mouth daily as needed for allergies., Disp: , Rfl:    escitalopram (LEXAPRO) 10 MG tablet, Take 1 tablet (10 mg total) by mouth daily., Disp: 90 tablet, Rfl: 1   fluticasone (FLONASE) 50 MCG/ACT nasal spray, Place 1 spray into both nostrils as needed for allergies or rhinitis., Disp: , Rfl:    levothyroxine (SYNTHROID) 75 MCG tablet, Take 1 tablet (75 mcg total) by mouth daily., Disp: 90 tablet, Rfl: 1   omeprazole (PRILOSEC) 40 MG capsule, Take 40 mg by mouth every morning., Disp: , Rfl:    Probiotic Product (ALOE 54270 & PROBIOTICS PO), Probiotics, Disp: , Rfl:    Medications ordered in this encounter:  Meds ordered this encounter  Medications   ondansetron (ZOFRAN) 4 MG tablet    Sig: Take 1 tablet (4 mg total) by mouth every 8 (eight) hours as needed for nausea or vomiting.    Dispense:  20 tablet    Refill:  0   oseltamivir (TAMIFLU) 75 MG capsule    Sig: Take 1 capsule (75  mg total) by mouth 2 (two) times daily for 5 days.    Dispense:  10 capsule    Refill:  0     *If you need refills on other medications prior to your next appointment, please contact your pharmacy*  Follow-Up: Call back or seek an in-person evaluation if the symptoms worsen or if the condition fails to improve as anticipated.  [facility] [phone]  Other Instructions Take Tamiflu as prescribed.  Take Zofran as needed for nausea and vomiting.  Can take over the counter Imodium as needed for diarrhea.  Drink plenty of fluids, water, gatorade, pedialyte.  Can take Mucinex and use Flonase for congestion.  Recommend over the counter Delsym for cough if needed.  May return to work as long as fever free for 24 hours.   If you have been instructed to have an in-person evaluation today at a local Urgent Care facility, please use the link below. It will take you to a list of all of our available [facility], including address, phone number and hours of operation. Please do not delay care.  [facility]  If you or a family member do not have a primary care provider, use the link below to schedule a visit and establish care. When you choose a [facility] primary care physician or advanced practice provider, you gain  a long-term partner in health. Find a Primary Care Provider  Learn more about [facility]'s in-office and virtual care options: [facility] - Get Care Now  

## 2022-03-24 NOTE — Progress Notes (Signed)
Virtual Visit Consent   Sylvia Taylor, you are scheduled for a virtual visit with a [facility] provider today. Just as with appointments in the office, your consent must be obtained to participate. Your consent will be active for this visit and any virtual visit you may have with one of our providers in the next 365 days. If you have a MyChart account, a copy of this consent can be sent to you electronically.  As this is a virtual visit, video technology does not allow for your provider to perform a traditional examination. This may limit your provider's ability to fully assess your condition. If your provider identifies any concerns that need to be evaluated in person or the need to arrange testing (such as labs, EKG, etc.), we will make arrangements to do so. Although advances in technology are sophisticated, we cannot ensure that it will always work on either your end or our end. If the connection with a video visit is poor, the visit may have to be switched to a telephone visit. With either a video or telephone visit, we are not always able to ensure that we have a secure connection.  By engaging in this virtual visit, you consent to the provision of healthcare and authorize for your insurance to be billed (if applicable) for the services provided during this visit. Depending on your insurance coverage, you may receive a charge related to this service.  I need to obtain your verbal consent now. Are you willing to proceed with your visit today? Sylvia Taylor has provided verbal consent on 03/24/2022 for a virtual visit (video or telephone). Sylvia Arena Ward, PA-C  Date: 03/24/2022 10:22 AM  Virtual Visit via Video Note   I, Sylvia Taylor, connected with  Sylvia Taylor  ([id number], 11/05/1970) on 03/24/22 at 10:30 AM EST by a video-enabled telemedicine application and verified that I am speaking with the correct person using two identifiers.  Location: Patient: Virtual Visit Location Patient:  Home Provider: Virtual Visit Location Provider: Home Office   I discussed the limitations of evaluation and management by telemedicine and the availability of in person appointments. The patient expressed understanding and agreed to proceed.    History of Present Illness: Sylvia Taylor is a 52 y.o. who identifies as a female who was assigned female at birth, and is being seen today for n/v, cough, and headache.  She reports subjective fevers, chills.  Sudden onset of symptoms yesterday. She has not taking any medications for sx because she has not been able to keep anything down.  Pt took an at home COVID test which was negative.  Denies shortness of breath or wheezing.  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   HPI: HPI  Problems:  Patient Active Problem List   Diagnosis Date Noted   GAD (generalized anxiety disorder) 02/14/2022   Obstructive sleep apnea 04/20/2017   Severe obesity (BMI >= 40) (HCC) 06/06/2014   Fluid retention in legs 06/02/2013   Hypothyroidism 11/03/2012   Allergic rhinitis 11/03/2012    Allergies:  Allergies  Allergen Reactions   Amoxicillin Hives   Medications:  Current Outpatient Medications:    ondansetron (ZOFRAN) 4 MG tablet, Take 1 tablet (4 mg total) by mouth every 8 (eight) hours as needed for nausea or vomiting., Disp: 20 tablet, Rfl: 0   oseltamivir (TAMIFLU) 75 MG capsule, Take 1 capsule (75 mg total) by mouth 2 (two) times daily for 5 days., Disp: 10  capsule, Rfl: 0   cetirizine (ZYRTEC) 10 MG tablet, Take 10 mg by mouth daily as needed for allergies., Disp: , Rfl:    escitalopram (LEXAPRO) 10 MG tablet, Take 1 tablet (10 mg total) by mouth daily., Disp: 90 tablet, Rfl: 1   fluticasone (FLONASE) 50 MCG/ACT nasal spray, Place 1 spray into both nostrils as needed for allergies or rhinitis., Disp: , Rfl:    levothyroxine (SYNTHROID) 75 MCG tablet, Take 1 tablet (75 mcg total) by mouth daily., Disp: 90 tablet, Rfl: 1    omeprazole (PRILOSEC) 40 MG capsule, Take 40 mg by mouth every morning., Disp: , Rfl:    Probiotic Product (ALOE 52841 & PROBIOTICS PO), Probiotics, Disp: , Rfl:   Observations/Objective: Patient is well-developed, well-nourished in no acute distress.  Resting comfortably at home.  Head is normocephalic, atraumatic.  No labored breathing.  Speech is clear and coherent with logical content.  Patient is alert and oriented at baseline.    Assessment and Plan: 1. Flu-like symptoms - oseltamivir (TAMIFLU) 75 MG capsule; Take 1 capsule (75 mg total) by mouth 2 (two) times daily for 5 days.  Dispense: 10 capsule; Refill: 0  2. Nausea and vomiting, unspecified vomiting type - ondansetron (ZOFRAN) 4 MG tablet; Take 1 tablet (4 mg total) by mouth every 8 (eight) hours as needed for nausea or vomiting.  Dispense: 20 tablet; Refill: 0  Pt in no acute distress on video visit.  Given onset and description of sx, sx seem flu like in nature.  Will treat. Home covid test negative.  In person evaluation precautions given.   Follow Up Instructions: I discussed the assessment and treatment plan with the patient. The patient was provided an opportunity to ask questions and all were answered. The patient agreed with the plan and demonstrated an understanding of the instructions.  A copy of instructions were sent to the patient via MyChart unless otherwise noted below.     The patient was advised to call back or seek an in-person evaluation if the symptoms worsen or if the condition fails to improve as anticipated.  Time:  I spent 7 minutes with the patient via telehealth technology discussing the above problems/concerns.    Sylvia Arena Ward, PA-C

## 2022-03-26 ENCOUNTER — Encounter: Payer: Self-pay | Admitting: Family Medicine

## 2022-03-26 ENCOUNTER — Telehealth: Payer: BC Managed Care – PPO | Admitting: Family Medicine

## 2022-03-26 DIAGNOSIS — U071 COVID-19: Secondary | ICD-10-CM

## 2022-03-26 MED ORDER — NIRMATRELVIR/RITONAVIR (PAXLOVID)TABLET: 3.0000 | ORAL_TABLET | Freq: Two times a day (BID) | ORAL | 0 refills | Status: AC

## 2022-03-26 NOTE — Progress Notes (Signed)
Subjective:    Patient ID: Sylvia Taylor, female    DOB: 04-01-1970, 52 y.o.   MRN: [id number]   HPI: Sylvia Taylor is a 52 y.o. female presenting for 3 days ago onset of vomiting and diarrhea. Next day started with cough and congestion. No more vomiting. Stool is loose still. Feels feverish. This AM Covid test was positive. Taste is diminished. Ha a headache. Profuse cough. Occasional green in the am, then  clear sputum.       02/14/2022   10:18 AM 07/25/2021    1:31 PM 06/15/2021    3:31 PM 03/13/2021    8:37 AM 06/15/2020    3:28 PM  Depression screen PHQ 2/9  Decreased Interest 0 0 0 0 0  Down, Depressed, Hopeless 0 0 0 0 0  PHQ - 2 Score 0 0 0 0 0  Altered sleeping 1 0 1 0   Tired, decreased energy 1 1 1 $ 0   Change in appetite 0 0 0 0   Feeling bad or failure about yourself  0 0 0 0   Trouble concentrating 0 0 0 0   Moving slowly or fidgety/restless 0 0 0 0   Suicidal thoughts 0 0 0 0   PHQ-9 Score 2 1 2 $ 0   Difficult doing work/chores Not difficult at all Not difficult at all Not difficult at all Not difficult at all      Relevant past medical, surgical, family and social history reviewed and updated as indicated.  Interim medical history since our last visit reviewed. Allergies and medications reviewed and updated.  ROS:  Review of Systems  Constitutional:  Negative for activity change, appetite change, chills and fever.  HENT:  Positive for congestion. Negative for ear pain, rhinorrhea and trouble swallowing.   Respiratory:  Positive for cough. Negative for chest tightness and shortness of breath.   Cardiovascular:  Negative for chest pain and palpitations.  Gastrointestinal:  Positive for diarrhea, nausea and vomiting.  Skin:  Negative for rash.     Social History   Tobacco Use  Smoking Status Never  Smokeless Tobacco Never       Objective:     Wt Readings from Last 3 Encounters:  02/14/22 (!) 371 lb (168.3 kg)  10/05/21 (!) 347 lb (157.4 kg)   07/25/21 (!) 362 lb (164.2 kg)     Exam deferred. Pt. [place] due to COVID 19. Phone visit performed.   Assessment & Plan:   1. COVID-19 virus infection     Meds ordered this encounter  Medications   nirmatrelvir/ritonavir (PAXLOVID) 20 x 150 MG & 10 x 100MG TABS    Sig: Take 3 tablets by mouth 2 (two) times daily for 5 days. (Take nirmatrelvir 150 mg two tablets twice daily for 5 days and ritonavir 100 mg one tablet twice daily for 5 days) Patient GFR is 114    Dispense:  30 tablet    Refill:  0    No orders of the defined types were placed in this encounter.     Diagnoses and all orders for this visit:  COVID-19 virus infection  Other orders -     nirmatrelvir/ritonavir (PAXLOVID) 20 x 150 MG & 10 x 100MG TABS; Take 3 tablets by mouth 2 (two) times daily for 5 days. (Take nirmatrelvir 150 mg two tablets twice daily for 5 days and ritonavir 100 mg one tablet twice daily for 5 days) Patient GFR is 114    Virtual Visit  via telephone Note  I discussed the limitations, risks, security and privacy concerns of performing an evaluation and management service by telephone and the availability of in person appointments. The patient was identified with two identifiers. Pt.expressed understanding and agreed to proceed. Pt. Is at home. Dr. Livia Snellen is in his office.  Follow Up Instructions:   I discussed the assessment and treatment plan with the patient. The patient was provided an opportunity to ask questions and all were answered. The patient agreed with the plan and demonstrated an understanding of the instructions.   The patient was advised to call back or seek an in-person evaluation if the symptoms worsen or if the condition fails to improve as anticipated.   Total minutes including chart review and phone contact time: 17   Follow up plan: No follow-ups on file.  Claretta Fraise, MD [facility]

## 2022-05-21 ENCOUNTER — Ambulatory Visit: Payer: BC Managed Care – PPO | Admitting: Nurse Practitioner

## 2022-05-21 ENCOUNTER — Encounter: Payer: Self-pay | Admitting: Nurse Practitioner

## 2022-05-21 VITALS — BP 130/88 | HR 80 | Ht 65.0 in | Wt 385.0 lb

## 2022-05-21 DIAGNOSIS — M5432 Sciatica, left side: Secondary | ICD-10-CM | POA: Diagnosis not present

## 2022-05-21 MED ORDER — PREDNISONE 20 MG PO TABS: ORAL_TABLET | ORAL | 0 refills | Status: DC

## 2022-05-21 MED ORDER — METHYLPREDNISOLONE ACETATE 80 MG/ML IJ SUSP
80.0000 mg | Freq: Once | INTRAMUSCULAR | Status: AC
  Administered 2022-05-21: 80 mg via INTRAMUSCULAR

## 2022-05-21 MED ORDER — KETOROLAC TROMETHAMINE 60 MG/2ML IM SOLN
60.0000 mg | Freq: Once | INTRAMUSCULAR | Status: AC
  Administered 2022-05-21: 60 mg via INTRAMUSCULAR

## 2022-05-21 NOTE — Patient Instructions (Signed)
Sciatica  Sciatica is pain, weakness, tingling, or loss of feeling (numbness) along the sciatic nerve. The sciatic nerve starts in the lower back and goes down the back of each leg. Sciatica usually affects one side of the body. Sciatica usually goes away on its own or with treatment. Sometimes, sciatica may come back. What are the causes? This condition happens when the sciatic nerve is pinched or has pressure put on it. This may be caused by: A disk in between the bones of the spine bulging out too far (herniated disk). Changes in the spinal disks due to aging. A condition that affects a muscle in the butt. Extra bone growth near the sciatic nerve. A break (fracture) of the area between your hip bones (pelvis). Pregnancy. Tumor. This is rare. What increases the risk? You are more likely to develop this condition if you: Play sports that put pressure or stress on the spine. Have poor strength and ease of movement (flexibility). Have had a back injury or back surgery. Sit for long periods of time. Do activities that involve bending or lifting over and over again. Are very overweight (obese). What are the signs or symptoms? Symptoms can vary from mild to very bad. They may include: Any of these problems in the lower back, leg, hip, or butt: Mild tingling, loss of feeling, or dull aches. A burning feeling. Sharp pains. Loss of feeling in the back of the calf or the sole of the foot. Leg weakness. Very bad back pain that makes it hard to move. These symptoms may get worse when you cough, sneeze, or laugh. They may also get worse when you sit or stand for long periods of time. How is this treated? This condition often gets better without any treatment. However, treatment may include: Changing or cutting back on physical activity when you have pain. Exercising, including strengthening and stretching. Putting ice or heat on the affected area. Shots of medicines to relieve pain and  swelling or to relax your muscles. Surgery. Follow these instructions at home: Medicines Take over-the-counter and prescription medicines only as told by your doctor. Ask your doctor if you should avoid driving or using machines while you are taking your medicine. Managing pain     If told, put ice on the affected area. To do this: Put ice in a plastic bag. Place a towel between your skin and the bag. Leave the ice on for 20 minutes, 2-3 times a day. If your skin turns bright red, take off the ice right away to prevent skin damage. The risk of skin damage is higher if you cannot feel pain, heat, or cold. If told, put heat on the affected area. Do this as often as told by your doctor. Use the heat source that your doctor tells you to use, such as a moist heat pack or a heating pad. Place a towel between your skin and the heat source. Leave the heat on for 20-30 minutes. If your skin turns bright red, take off the heat right away to prevent burns. The risk of burns is higher if you cannot feel pain, heat, or cold. Activity  Return to your normal activities when your doctor says that it is safe. Avoid activities that make your symptoms worse. Take short rests during the day. When you rest for a long time, do some physical activity or stretching between periods of rest. Avoid sitting for a long time without moving. Get up and move around at least one time each   hour. Do exercises and stretches as told by your doctor. Do not lift anything that is heavier than 10 lb (4.5 kg). Avoid lifting heavy things even when you do not have symptoms. Avoid lifting heavy things over and over. When you lift objects, always lift in a way that is safe for your body. To do this, you should: Bend your knees. Keep the object close to your body. Avoid twisting. General instructions Stay at a healthy weight. Wear comfortable shoes that support your feet. Avoid wearing high heels. Avoid sleeping on a mattress  that is too soft or too hard. You might have less pain if you sleep on a mattress that is firm enough to support your back. Contact a doctor if: Your pain is not controlled by medicine. Your pain does not get better. Your pain gets worse. Your pain lasts longer than 4 weeks. You lose weight without trying. Get help right away if: You cannot control when you pee (urinate) or poop (have a bowel movement). You have weakness in any of these areas and it gets worse: Lower back. The area between your hip bones. Butt. Legs. You have redness or swelling of your back. You have a burning feeling when you pee. Summary Sciatica is pain, weakness, tingling, or loss of feeling (numbness) along the sciatic nerve. This may include the lower back, legs, hips, and butt. This condition happens when the sciatic nerve is pinched or has pressure put on it. Treatment often includes rest, exercise, medicines, and putting ice or heat on the affected area. This information is not intended to replace advice given to you by your health care provider. Make sure you discuss any questions you have with your health care provider. Document Revised: 05/07/2021 Document Reviewed: 05/07/2021 Elsevier Patient Education  2023 Elsevier Inc.  

## 2022-05-21 NOTE — Progress Notes (Signed)
   Subjective:    Patient ID: Sylvia Taylor, female    DOB: [birth year], 52 y.o.   MRN: [id number]   Chief Complaint: Left sided pain (Starts at left buttock down left thigh)   HPI  Patient Active Problem List   Diagnosis Date Noted   GAD (generalized anxiety disorder) 02/14/2022   Obstructive sleep apnea 04/20/2017   Severe obesity (BMI >= 40) 06/06/2014   Fluid retention in legs 06/02/2013   Hypothyroidism 11/03/2012   Allergic rhinitis 11/03/2012   Patient comes in c/o pain left buttocks that radiates down leg. Started 3 weeks ago. Is gradually worsening. Rates pain 8/10 today. She has tried stretching, motrin and no relief.    Review of Systems  Constitutional:  Negative for diaphoresis.  Eyes:  Negative for pain.  Respiratory:  Negative for shortness of breath.   Cardiovascular:  Negative for chest pain, palpitations and leg swelling.  Gastrointestinal:  Negative for abdominal pain.  Endocrine: Negative for polydipsia.  Skin:  Negative for rash.  Neurological:  Negative for dizziness, weakness and headaches.  Hematological:  Does not bruise/bleed easily.  All other systems reviewed and are negative.      Objective:   Physical Exam Constitutional:      Appearance: Normal appearance.  Cardiovascular:     Rate and Rhythm: Normal rate and regular rhythm.     Heart sounds: Normal heart sounds.  Pulmonary:     Effort: Pulmonary effort is normal.     Breath sounds: Normal breath sounds.  Musculoskeletal:     Comments: FROM of lumbar spine without pain Negative straight leg raise on left  Skin:    General: Skin is warm.  Neurological:     General: No focal deficit present.     Mental Status: She is alert and oriented to person, place, and time.  Psychiatric:        Mood and Affect: Mood normal.        Behavior: Behavior normal.    BP 130/88   Pulse 80   Ht 5\' 5"  (1.651 m)   Wt (!) 385 lb (174.6 kg)   LMP 09/13/2015   SpO2 99%   BMI 64.07 kg/m          Assessment & Plan:   Sylvia Taylor in today with chief complaint of Left sided pain (Starts at left buttock down left thigh)   1. Sciatica of left side Moist heat rest - methylPREDNISolone acetate (DEPO-MEDROL) injection 80 mg - ketorolac (TORADOL) injection 60 mg - predniSONE (DELTASONE) 20 MG tablet; 2 po at sametime daily for 5 days-  Dispense: 10 tablet; Refill: 0    The above assessment and management plan was discussed with the patient. The patient verbalized understanding of and has agreed to the management plan. Patient is aware to call the clinic if symptoms persist or worsen. Patient is aware when to return to the clinic for a follow-up visit. Patient educated on when it is appropriate to go to the emergency department.   [name], FNP

## 2022-07-19 ENCOUNTER — Ambulatory Visit (HOSPITAL_BASED_OUTPATIENT_CLINIC_OR_DEPARTMENT_OTHER)
Admission: RE | Admit: 2022-07-19 | Discharge: 2022-07-19 | Disposition: A | Discharge status: Home / Self Care | Payer: BC Managed Care – PPO | Source: Ambulatory Visit | Attending: Nurse Practitioner | Admitting: Nurse Practitioner

## 2022-07-19 ENCOUNTER — Ambulatory Visit: Payer: BC Managed Care – PPO | Admitting: Nurse Practitioner

## 2022-07-19 ENCOUNTER — Encounter: Payer: Self-pay | Admitting: Nurse Practitioner

## 2022-07-19 VITALS — BP 123/78 | HR 74 | Temp 97.2°F | Resp 20 | Ht 65.0 in | Wt 380.0 lb

## 2022-07-19 DIAGNOSIS — M79661 Pain in right lower leg: Secondary | ICD-10-CM | POA: Diagnosis present

## 2022-07-19 NOTE — Patient Instructions (Signed)
Deep Vein Thrombosis  Deep vein thrombosis (DVT) is a condition in which a blood clot forms in a vein of the deep venous system. This can occur in the lower leg, thigh, pelvis, arm, or neck. A clot is blood that has thickened into a gel or solid. This condition is serious and can be life-threatening if the clot travels to the arteries of the lungs and causes a blockage (pulmonary embolism). A DVT can also damage veins in the leg, which can lead to long-term venous disease, leg pain, swelling, discoloration, and ulcers or sores (post-thrombotic syndrome). What are the causes? This condition may be caused by: A slowdown of blood flow. Damage to a vein. A condition that causes blood to clot more easily, such as certain bleeding disorders. What increases the risk? The following factors may make you more likely to develop this condition: Obesity. Being older, especially older than age 60. Being inactive or not moving around (sedentary lifestyle). This may include: Sitting or lying down for longer than 4-6 hours other than to sleep at night. Being in the hospital, or having major or lengthy surgery. Having any recent bone injuries, such as breaks (fractures), that reduce movement, especially in the lower extremities. Having recent orthopedic surgery on the lower extremities. Being pregnant, giving birth, or having recently given birth. Taking medicines that contain estrogen, such as birth control or hormone replacement therapy. Using products that contain nicotine or tobacco, especially if you use hormonal birth control. Having a history of a blood vessel disease (peripheral vascular disease) or congestive heart disease. Having a history of cancer, especially if being treated with chemotherapy. What are the signs or symptoms? Symptoms of this condition include: Swelling, pain, pressure, or tenderness in an arm or a leg. An arm or a leg becoming warm, red, or discolored. A leg turning very pale or  blue. You may have a large DVT. This is rare. If the clot is in your leg, you may notice that symptoms get worse when you stand or walk. In some cases, there are no symptoms. How is this diagnosed? This condition is diagnosed with: Your medical history and a physical exam. Tests, such as: Blood tests to check how well your blood clots. Doppler ultrasound. This is the best way to find a DVT. CT venogram. Contrast dye is injected into a vein, and X-rays are taken to check for clots. This is helpful for veins in the chest or pelvis. How is this treated? Treatment for this condition depends on: The cause of your DVT. The size and location of your DVT, or having more than one DVT. Your risk for bleeding or developing more clots. Other medical conditions you may have. Treatment may include: Taking a blood thinner medicine (anticoagulant) to prevent more clots from forming or current clots from growing. Wearing compression stockings. Injecting medicines into the affected vein to break up the clot (catheter-directed thrombolysis). Surgical procedures, when DVT is severe or hard to treat. These may be done to: Isolate and remove your clot. Place an inferior vena cava (IVC) filter. This filter is placed into a large vein called the inferior vena cava to catch blood clots before they reach your lungs. You may get some medical treatments for 6 months or longer. Follow these instructions at home: If you are taking blood thinners: Talk with your health care provider before you take any medicines that contain aspirin or NSAIDs, such as ibuprofen. These medicines increase your risk for dangerous bleeding. Take your medicine exactly   as told, at the same time every day. Do not skip a dose. Do not take more than the prescribed dose. This is important. Ask your health care provider about foods and medicines that could change or interact with the way your blood thinner works. Avoid these foods and medicines  if you are told to do so. Avoid anything that may cause bleeding or bruising. You may bleed more easily while taking blood thinners. Be very careful when using knives, scissors, or other sharp objects. Use an electric razor instead of a blade. Avoid activities that could cause injury or bruising, and follow instructions for preventing falls. Tell your health care provider if you have had any internal bleeding, bleeding ulcers, or neurologic diseases, such as strokes or cerebral aneurysms. Wear a medical alert bracelet or carry a card that lists what medicines you take. General instructions Take over-the-counter and prescription medicines only as told by your health care provider. Return to your normal activities as told by your health care provider. Ask your health care provider what activities are safe for you. If recommended, wear compression stockings as told by your health care provider. These stockings help to prevent blood clots and reduce swelling in your legs. Never wear your compression stockings while sleeping at night. Keep all follow-up visits. This is important. Where to find more information American Heart Association: www.heart.org Centers for Disease Control and Prevention: www.cdc.gov National Heart, Lung, and Blood Institute: www.nhlbi.nih.gov Contact a health care provider if: You miss a dose of your blood thinner. You have unusual bruising or other color changes. You have new or worse pain, swelling, or redness in an arm or a leg. You have worsening numbness or tingling in an arm or a leg. You have a significant color change (pale or blue) in the extremity that has the DVT. Get help right away if: You have signs or symptoms that a blood clot has moved to the lungs. These may include: Shortness of breath. Chest pain. Fast or irregular heartbeats (palpitations). Light-headedness, dizziness, or fainting. Coughing up blood. You have signs or symptoms that your blood is  too thin. These may include: Blood in your vomit, stool, or urine. A cut that will not stop bleeding. A menstrual period that is heavier than usual. A severe headache or confusion. These symptoms may be an emergency. Get help right away. Call 911. Do not wait to see if the symptoms will go away. Do not drive yourself to the hospital. Summary Deep vein thrombosis (DVT) happens when a blood clot forms in a deep vein. This may occur in the lower leg, thigh, pelvis, arm, or neck. Symptoms affect the arm or leg and can include swelling, pain, tenderness, warmth, redness, or discoloration. This condition may be treated with medicines. In severe cases, a procedure or surgery may be done to remove or dissolve the clots. If you are taking blood thinners, take them exactly as told. Do not skip a dose. Do not take more than is prescribed. Get help right away if you have a severe headache, shortness of breath, chest pain, fast or irregular heartbeats, or blood in your vomit, urine, or stool. This information is not intended to replace advice given to you by your health care provider. Make sure you discuss any questions you have with your health care provider. Document Revised: 08/21/2020 Document Reviewed: 08/21/2020 Elsevier Patient Education  2024 [employer]  

## 2022-07-19 NOTE — Progress Notes (Signed)
   Subjective:    Patient ID: Sylvia Taylor, female    DOB: 1970/05/02, 52 y.o.   MRN: [id number]    Chief Complaint: Right leg pain (Feels tight and swollen/)   HPI  Patient says the last week or so she has had right lower leg edema. Feels like there is a band around her leg. It has gotten better today. She denies any reddness, pain or cramping.   Patient Active Problem List   Diagnosis Date Noted   GAD (generalized anxiety disorder) 02/14/2022   Obstructive sleep apnea 04/20/2017   Severe obesity (BMI >= 40) (HCC) 06/06/2014   Fluid retention in legs 06/02/2013   Hypothyroidism 11/03/2012   Allergic rhinitis 11/03/2012      Review of Systems  Constitutional:  Negative for diaphoresis.  Eyes:  Negative for pain.  Respiratory:  Negative for shortness of breath.   Cardiovascular:  Positive for leg swelling (right only). Negative for chest pain and palpitations.  Gastrointestinal:  Negative for abdominal pain.  Endocrine: Negative for polydipsia.  Skin:  Negative for rash.  Neurological:  Negative for dizziness, weakness and headaches.  Hematological:  Does not bruise/bleed easily.  All other systems reviewed and are negative.      Objective:   Physical Exam Vitals reviewed.  Constitutional:      Appearance: Normal appearance.  Cardiovascular:     Rate and Rhythm: Normal rate and regular rhythm.     Heart sounds: Normal heart sounds.  Pulmonary:     Effort: Pulmonary effort is normal.     Breath sounds: Normal breath sounds.  Musculoskeletal:     Right lower leg: Edema (1+ with posterior calf pain and swelling) present.  Skin:    General: Skin is warm.  Neurological:     General: No focal deficit present.     Mental Status: She is alert and oriented to person, place, and time.     BP 123/78   Pulse 74   Temp (!) 97.2 F (36.2 C) (Temporal)   Resp 20   Ht 5\' 5"  (1.651 m)   Wt (!) 380 lb (172.4 kg)   LMP 09/13/2015   SpO2 93%   BMI 63.24 kg/m         Assessment & Plan:   Sylvia Taylor in today with chief complaint of Right leg pain (Feels tight and swollen/)   1. Right calf pain Moist heat Rest Stat doppler study    The above assessment and management plan was discussed with the patient. The patient verbalized understanding of and has agreed to the management plan. Patient is aware to call the clinic if symptoms persist or worsen. Patient is aware when to return to the clinic for a follow-up visit. Patient educated on when it is appropriate to go to the emergency department.   Sylvia Daphine Deutscher, FNP

## 2022-08-13 ENCOUNTER — Encounter: Payer: Self-pay | Admitting: Nurse Practitioner

## 2022-08-13 ENCOUNTER — Ambulatory Visit: Payer: BC Managed Care – PPO | Admitting: Nurse Practitioner

## 2022-08-13 VITALS — BP 135/87 | HR 71 | Temp 97.7°F | Resp 20 | Ht 65.0 in | Wt 383.0 lb

## 2022-08-13 DIAGNOSIS — F411 Generalized anxiety disorder: Secondary | ICD-10-CM

## 2022-08-13 DIAGNOSIS — G43109 Migraine with aura, not intractable, without status migrainosus: Secondary | ICD-10-CM | POA: Diagnosis not present

## 2022-08-13 DIAGNOSIS — R600 Localized edema: Secondary | ICD-10-CM

## 2022-08-13 DIAGNOSIS — E034 Atrophy of thyroid (acquired): Secondary | ICD-10-CM

## 2022-08-13 MED ORDER — KETOROLAC TROMETHAMINE 60 MG/2ML IM SOLN
60.0000 mg | Freq: Once | INTRAMUSCULAR | Status: AC
  Administered 2022-08-13: 60 mg via INTRAMUSCULAR

## 2022-08-13 MED ORDER — ESCITALOPRAM OXALATE 10 MG PO TABS: 10.0000 mg | ORAL_TABLET | Freq: Every day | ORAL | 1 refills | Status: DC

## 2022-08-13 MED ORDER — LEVOTHYROXINE SODIUM 75 MCG PO TABS: 75.0000 ug | ORAL_TABLET | Freq: Every day | ORAL | 1 refills | Status: DC

## 2022-08-13 NOTE — Patient Instructions (Signed)
Migraine Headache A migraine headache is a very strong throbbing pain on one or both sides of your head. This type of headache can also cause other symptoms. It can last from 4 hours to 3 days. Talk with your doctor about what things may bring on (trigger) this condition. What are the causes? The exact cause of a migraine is not known. This condition may be brought on or caused by: Smoking. Medicines, such as: Medicine used to treat chest pain (nitroglycerin). Birth control pills. Estrogen. Some blood pressure medicines. Certain substances in some foods or drinks. Foods and drinks, such as: Cheese. Chocolate. Alcohol. Caffeine. Doing physical activity that is very hard. Other things that may trigger a migraine headache include: Periods. Pregnancy. Hunger. Stress. Getting too much or too little sleep. Weather changes. Feeling tired (fatigue). What increases the risk? Being 25-55 years old. Being female. Having a family history of migraine headaches. Being Caucasian. Having a mental health condition, such as being sad (depressed) or feeling worried or nervous (anxious). Being very overweight (obese). What are the signs or symptoms? A throbbing pain. This pain may: Happen in any area of the head, such as on one or both sides. Make it hard to do daily activities. Get worse with physical activity. Get worse around bright lights, loud noises, or smells. Other symptoms may include: Feeling like you may vomit (nauseous). Vomiting. Dizziness. Before a migraine headache starts, you may get warning signs (an aura). An aura may include: Seeing flashing lights or having blind spots. Seeing bright spots, halos, or zigzag lines. Having tunnel vision or blurred vision. Having numbness or a tingling feeling. Having trouble talking. Having weak muscles. After a migraine ends, you may have symptoms. These may include: Tiredness. Trouble thinking (concentrating). How is this  treated? Taking medicines that: Relieve pain. Relieve the feeling like you may vomit. Prevent migraine headaches. Treatment may also include: Acupuncture. Lifestyle changes like avoiding foods that bring on migraine headaches. Learning ways to control your body functions (biofeedback). Therapy to help you know and deal with negative thoughts (cognitive behavioral therapy). Follow these instructions at home: Medicines Take over-the-counter and prescription medicines only as told by your doctor. If told, take steps to prevent problems with pooping (constipation). You may need to: Drink enough fluid to keep your pee (urine) pale yellow. Take medicines. You will be told what medicines to take. Eat foods that are high in fiber. These include beans, whole grains, and fresh fruits and vegetables. Limit foods that are high in fat and sugar. These include fried or sweet foods. Ask your doctor if you should avoid driving or using machines while you are taking your medicine. Lifestyle  Do not drink alcohol. Do not smoke or use any products that contain nicotine or tobacco. If you need help quitting, ask your doctor. Get 7-9 hours of sleep each night, or the amount recommended by your doctor. Find ways to deal with stress, such as meditation, deep breathing, or yoga. Try to exercise often. This can help lessen how bad and how often your migraines happen. General instructions Keep a journal to find out what may bring on your migraine headaches. This can help you avoid those things. For example, write down: What you eat and drink. How much sleep you get. Any change to your medicines or diet. If you have a migraine headache: Avoid things that make your symptoms worse, such as bright lights. Lie down in a dark, quiet room. Do not drive or use machinery. Ask your   doctor what activities are safe for you. Where to find more information Coalition for Headache and Migraine Patients (CHAMP):  headachemigraine.org American Migraine Foundation: americanmigrainefoundation.org National Headache Foundation: headaches.org Contact a doctor if: You get a migraine headache that is different or worse than others you have had. You have more than 15 days of headaches in one month. Get help right away if: Your migraine headache gets very bad. Your migraine headache lasts more than 72 hours. You have a fever or stiff neck. You have trouble seeing. Your muscles feel weak or like you cannot control them. You lose your balance a lot. You have trouble walking. You faint. You have a seizure. This information is not intended to replace advice given to you by your health care provider. Make sure you discuss any questions you have with your health care provider. Document Revised: 09/24/2021 Document Reviewed: 09/24/2021 Elsevier Patient Education  2024 [employer] Inc.  

## 2022-08-13 NOTE — Progress Notes (Signed)
Subjective:    Patient ID: Sylvia Taylor, female    DOB: 1970-04-20, 52 y.o.   MRN: [id number]   Chief Complaint: Medical Management of Chronic Issues    HPI:  Sylvia Taylor is a 52 y.o. who identifies as a female who was assigned female at birth.   Social history: Lives with: husband and children Work history: Engineer, site   Comes in today for follow up of the following chronic medical issues:  1. Hypothyroidism due to acquired atrophy of thyroid No issues that she is aware of. Lab Results  Component Value Date   TSH 3.090 02/14/2022     2. Fluid retention in legs Has daily by the end of the day. Denies SOB  3. GAD (generalized anxiety disorder) Is on lexapro and is doing well    08/13/2022   10:37 AM 07/19/2022    9:13 AM 05/21/2022   12:33 PM 02/14/2022   10:18 AM  GAD 7 : Generalized Anxiety Score  Nervous, Anxious, on Edge 0 0 0 0  Control/stop worrying 0 0 0 0  Worry too much - different things 0 0 0 0  Trouble relaxing 0 1 0 0  Restless 0 0 0 0  Easily annoyed or irritable 0 0 0 0  Afraid - awful might happen 0 0 0 0  Total GAD 7 Score 0 1 0 0  Anxiety Difficulty Not difficult at all Not difficult at all Not difficult at all Not difficult at all      4. Severe obesity (BMI >= 40) (HCC) No recent weight changes Wt Readings from Last 3 Encounters:  08/13/22 (!) 383 lb (173.7 kg)  07/19/22 (!) 380 lb (172.4 kg)  05/21/22 (!) 385 lb (174.6 kg)   BMI Readings from Last 3 Encounters:  08/13/22 63.73 kg/m  07/19/22 63.24 kg/m  05/21/22 64.07 kg/m      New complaints: Has had a migraine since Saturday. Gets one about every 2 months. Rates pain 8/10 right now. No visual disturbance.  Allergies  Allergen Reactions   Amoxicillin Hives   Outpatient Encounter Medications as of 08/13/2022  Medication Sig   cetirizine (ZYRTEC) 10 MG tablet Take 10 mg by mouth daily as needed for allergies.   escitalopram (LEXAPRO) 10 MG tablet Take 1 tablet (10 mg  total) by mouth daily.   fluticasone (FLONASE) 50 MCG/ACT nasal spray Place 1 spray into both nostrils as needed for allergies or rhinitis.   levothyroxine (SYNTHROID) 75 MCG tablet Take 1 tablet (75 mcg total) by mouth daily.   omeprazole (PRILOSEC) 40 MG capsule Take 40 mg by mouth every morning.   Probiotic Product (ALOE 46962 & PROBIOTICS PO) Probiotics   No facility-administered encounter medications on file as of 08/13/2022.    Past Surgical History:  Procedure Laterality Date   BIOPSY  10/27/2020   Procedure: BIOPSY;  Surgeon: Jeani Hawking, MD;  Location: [facility];  Service: Endoscopy;;   BREAST BIOPSY Left 05/27/2013   CESAREAN SECTION     CHOLECYSTECTOMY N/A 05/11/2020   Procedure: LAPAROSCOPIC CHOLECYSTECTOMY;  Surgeon: Abigail Miyamoto, MD;  Location: [facility] OR;  Service: General;  Laterality: N/A;   COLONOSCOPY WITH PROPOFOL N/A 03/24/2020   Procedure: COLONOSCOPY WITH PROPOFOL;  Surgeon: Jeani Hawking, MD;  Location: [facility];  Service: Endoscopy;  Laterality: N/A;   ESOPHAGOGASTRODUODENOSCOPY (EGD) WITH PROPOFOL N/A 10/27/2020   Procedure: ESOPHAGOGASTRODUODENOSCOPY (EGD) WITH PROPOFOL;  Surgeon: Jeani Hawking, MD;  Location: [facility];  Service: Endoscopy;  Laterality: N/A;  KNEE ARTHROSCOPY Right 09/2014   LIVER BIOPSY N/A 05/11/2020   Procedure: LIVER BIOPSY;  Surgeon: Abigail Miyamoto, MD;  Location[facility] OR;  Service: General;  Laterality: N/A;   POLYPECTOMY  03/24/2020   Procedure: POLYPECTOMY;  Surgeon: Jeani Hawking, MD;  Location: [facility];  Service: Endoscopy;;    Family History  Problem Relation Age of Onset   Hypertension Father    Sleep apnea Father    Breast cancer Maternal Grandmother        13s   Cancer Maternal Grandmother        ovarian and thyroid cancer      Controlled substance contract: n/a     Review of Systems  Constitutional:  Negative for diaphoresis.  Eyes:  Negative for photophobia, pain and visual disturbance.   Respiratory:  Negative for shortness of breath.   Cardiovascular:  Negative for chest pain, palpitations and leg swelling.  Gastrointestinal:  Negative for abdominal pain.  Endocrine: Negative for polydipsia.  Skin:  Negative for rash.  Neurological:  Negative for dizziness, weakness and headaches.  Hematological:  Does not bruise/bleed easily.  All other systems reviewed and are negative.      Objective:   Physical Exam Vitals and nursing note reviewed.  Constitutional:      General: She is not in acute distress.    Appearance: Normal appearance. She is well-developed.  HENT:     Head: Normocephalic.     Right Ear: Tympanic membrane normal.     Left Ear: Tympanic membrane normal.     Nose: Nose normal.     Mouth/Throat:     Mouth: Mucous membranes are moist.  Eyes:     Pupils: Pupils are equal, round, and reactive to light.  Neck:     Vascular: No carotid bruit or JVD.  Cardiovascular:     Rate and Rhythm: Normal rate and regular rhythm.     Heart sounds: Normal heart sounds.  Pulmonary:     Effort: Pulmonary effort is normal. No respiratory distress.     Breath sounds: Normal breath sounds. No wheezing or rales.  Chest:     Chest wall: No tenderness.  Abdominal:     General: Bowel sounds are normal. There is no distension or abdominal bruit.     Palpations: Abdomen is soft. There is no hepatomegaly, splenomegaly, mass or pulsatile mass.     Tenderness: There is no abdominal tenderness.  Musculoskeletal:        General: Normal range of motion.     Cervical back: Normal range of motion and neck supple.  Lymphadenopathy:     Cervical: No cervical adenopathy.  Skin:    General: Skin is warm and dry.  Neurological:     Mental Status: She is alert and oriented to person, place, and time.     Deep Tendon Reflexes: Reflexes are normal and symmetric.  Psychiatric:        Behavior: Behavior normal.        Thought Content: Thought content normal.        Judgment:  Judgment normal.    BP 135/87   Pulse 71   Temp 97.7 F (36.5 C) (Temporal)   Resp 20   Ht 5\' 5"  (1.651 m)   Wt (!) 383 lb (173.7 kg)   LMP 09/13/2015   SpO2 97%   BMI 63.73 kg/m         Assessment & Plan:   Sylvia Taylor comes in today with chief complaint of  Medical Management of Chronic Issues   Diagnosis and orders addressed:  1. Hypothyroidism due to acquired atrophy of thyroid Labs pending - Thyroid Panel With TSH - levothyroxine (SYNTHROID) 75 MCG tablet; Take 1 tablet (75 mcg total) by mouth daily.  Dispense: 90 tablet; Refill: 1  2. Fluid retention in legs Elevate legs when sitting - CBC with Differential/Platelet - CMP14+EGFR - Lipid panel  3. GAD (generalized anxiety disorder) Stress management - escitalopram (LEXAPRO) 10 MG tablet; Take 1 tablet (10 mg total) by mouth daily.  Dispense: 90 tablet; Refill: 1  4. Severe obesity (BMI >= 40) (HCC) Discussed diet and exercise for person with BMI >25 Will recheck weight in 3-6 months   5. Migraine with aura and without status migrainosus, not intractable Rest Cool compresses - ketorolac (TORADOL) injection 60 mg    Labs pending Health Maintenance reviewed Diet and exercise encouraged  Follow up plan: 6 months   [name], FNP

## 2022-08-14 LAB — CMP14+EGFR
ALT: 37 IU/L — ABNORMAL HIGH (ref 0–32)
AST: 25 IU/L (ref 0–40)
Albumin: 4.2 g/dL (ref 3.9–4.9)
Alkaline Phosphatase: 101 IU/L (ref 44–121)
BUN/Creatinine Ratio: 16 (ref 9–23)
BUN: 11 mg/dL (ref 6–24)
Bilirubin Total: 0.4 mg/dL (ref 0.0–1.2)
CO2: 26 mmol/L (ref 20–29)
Calcium: 9.5 mg/dL (ref 8.7–10.2)
Chloride: 102 mmol/L (ref 96–106)
Creatinine, Ser: 0.68 mg/dL (ref 0.57–1.00)
Globulin, Total: 2.7 g/dL (ref 1.5–4.5)
Glucose: 100 mg/dL — ABNORMAL HIGH (ref 70–99)
Potassium: 5.2 mmol/L (ref 3.5–5.2)
Sodium: 143 mmol/L (ref 134–144)
Total Protein: 6.9 g/dL (ref 6.0–8.5)
eGFR: 107 mL/min/{1.73_m2} (ref >59)

## 2022-08-14 LAB — CBC WITH DIFFERENTIAL/PLATELET
Basophils Absolute: 0 10*3/uL (ref 0.0–0.2)
Basos: 1 %
EOS (ABSOLUTE): 0.2 10*3/uL (ref 0.0–0.4)
Eos: 3 %
Hematocrit: 40.3 % (ref 34.0–46.6)
Hemoglobin: 13.1 g/dL (ref 11.1–15.9)
Immature Grans (Abs): 0 10*3/uL (ref 0.0–0.1)
Immature Granulocytes: 0 %
Lymphocytes Absolute: 1.9 10*3/uL (ref 0.7–3.1)
Lymphs: 29 %
MCH: 27.4 pg (ref 26.6–33.0)
MCHC: 32.5 g/dL (ref 31.5–35.7)
MCV: 84 fL (ref 79–97)
Monocytes Absolute: 0.4 10*3/uL (ref 0.1–0.9)
Monocytes: 6 %
Neutrophils Absolute: 4 10*3/uL (ref 1.4–7.0)
Neutrophils: 61 %
Platelets: 193 10*3/uL (ref 150–450)
RBC: 4.78 x10E6/uL (ref 3.77–5.28)
RDW: 13.4 % (ref 11.7–15.4)
WBC: 6.5 10*3/uL (ref 3.4–10.8)

## 2022-08-14 LAB — LIPID PANEL
Chol/HDL Ratio: 3.6 ratio (ref 0.0–4.4)
Cholesterol, Total: 166 mg/dL (ref 100–199)
HDL: 46 mg/dL (ref >39)
LDL Chol Calc (NIH): 102 mg/dL — ABNORMAL HIGH (ref 0–99)
Triglycerides: 100 mg/dL (ref 0–149)
VLDL Cholesterol Cal: 18 mg/dL (ref 5–40)

## 2022-08-14 LAB — THYROID PANEL WITH TSH
Free Thyroxine Index: 2.4 (ref 1.2–4.9)
T3 Uptake Ratio: 27 % (ref 24–39)
T4, Total: 9 ug/dL (ref 4.5–12.0)
TSH: 2.18 u[IU]/mL (ref 0.450–4.500)

## 2022-08-19 ENCOUNTER — Other Ambulatory Visit: Payer: Self-pay | Admitting: Nurse Practitioner

## 2022-08-19 DIAGNOSIS — Z1231 Encounter for screening mammogram for malignant neoplasm of breast: Secondary | ICD-10-CM

## 2022-09-10 ENCOUNTER — Emergency Department (HOSPITAL_BASED_OUTPATIENT_CLINIC_OR_DEPARTMENT_OTHER)
Admission: EM | Admit: 2022-09-10 | Discharge: 2022-09-11 | Disposition: A | Discharge status: Home / Self Care | Payer: BC Managed Care – PPO | Attending: Emergency Medicine | Admitting: Emergency Medicine

## 2022-09-10 ENCOUNTER — Encounter (HOSPITAL_BASED_OUTPATIENT_CLINIC_OR_DEPARTMENT_OTHER): Payer: Self-pay | Admitting: Emergency Medicine

## 2022-09-10 ENCOUNTER — Emergency Department (HOSPITAL_BASED_OUTPATIENT_CLINIC_OR_DEPARTMENT_OTHER): Payer: BC Managed Care – PPO | Admitting: Radiology

## 2022-09-10 DIAGNOSIS — R0602 Shortness of breath: Secondary | ICD-10-CM

## 2022-09-10 DIAGNOSIS — E039 Hypothyroidism, unspecified: Secondary | ICD-10-CM | POA: Diagnosis not present

## 2022-09-10 DIAGNOSIS — J181 Lobar pneumonia, unspecified organism: Secondary | ICD-10-CM | POA: Diagnosis not present

## 2022-09-10 DIAGNOSIS — Z20822 Contact with and (suspected) exposure to covid-19: Secondary | ICD-10-CM | POA: Diagnosis not present

## 2022-09-10 DIAGNOSIS — Z79899 Other long term (current) drug therapy: Secondary | ICD-10-CM | POA: Diagnosis not present

## 2022-09-10 DIAGNOSIS — J189 Pneumonia, unspecified organism: Secondary | ICD-10-CM

## 2022-09-10 LAB — CBC WITH DIFFERENTIAL/PLATELET
Abs Immature Granulocytes: 0.02 10*3/uL (ref 0.00–0.07)
Basophils Absolute: 0 10*3/uL (ref 0.0–0.1)
Basophils Relative: 1 %
Eosinophils Absolute: 0.2 10*3/uL (ref 0.0–0.5)
Eosinophils Relative: 3 %
HCT: 40.1 % (ref 36.0–46.0)
Hemoglobin: 12.8 g/dL (ref 12.0–15.0)
Immature Granulocytes: 0 %
Lymphocytes Relative: 17 %
Lymphs Abs: 1 10*3/uL (ref 0.7–4.0)
MCH: 27.3 pg (ref 26.0–34.0)
MCHC: 31.9 g/dL (ref 30.0–36.0)
MCV: 85.5 fL (ref 80.0–100.0)
Monocytes Absolute: 0.6 10*3/uL (ref 0.1–1.0)
Monocytes Relative: 10 %
Neutro Abs: 3.9 10*3/uL (ref 1.7–7.7)
Neutrophils Relative %: 69 %
Platelets: 177 10*3/uL (ref 150–400)
RBC: 4.69 MIL/uL (ref 3.87–5.11)
RDW: 13.9 % (ref 11.5–15.5)
WBC: 5.7 10*3/uL (ref 4.0–10.5)
nRBC: 0 % (ref 0.0–0.2)

## 2022-09-10 LAB — RESP PANEL BY RT-PCR (RSV, FLU A&B, COVID)  RVPGX2
Influenza A by PCR: NEGATIVE
Influenza B by PCR: NEGATIVE
Resp Syncytial Virus by PCR: NEGATIVE
SARS Coronavirus 2 by RT PCR: NEGATIVE

## 2022-09-10 LAB — BASIC METABOLIC PANEL
Anion gap: 8 (ref 5–15)
BUN: 11 mg/dL (ref 6–20)
CO2: 30 mmol/L (ref 22–32)
Calcium: 9.3 mg/dL (ref 8.9–10.3)
Chloride: 102 mmol/L (ref 98–111)
Creatinine, Ser: 0.79 mg/dL (ref 0.44–1.00)
GFR, Estimated: >60 mL/min (ref >60)
Glucose, Bld: 176 mg/dL — ABNORMAL HIGH (ref 70–99)
Potassium: 5 mmol/L (ref 3.5–5.1)
Sodium: 140 mmol/L (ref 135–145)

## 2022-09-10 NOTE — ED Triage Notes (Signed)
Started with wheeze on Thursday. Cough. Seen at [facility] Sunday, inhaler tessalon minimal response.  Sob when talking,  Headache  Diminished LUNG sounds

## 2022-09-10 NOTE — ED Provider Notes (Signed)
[facility] Provider Note: Lowella Dell, MD, FACEP  CSN: [id number] MRN: [id number] ARRIVAL: 09/10/22 at 2108 ROOM: DB012/DB012   CHIEF COMPLAINT  Shortness of Breath   HISTORY OF PRESENT ILLNESS  09/10/22 11:54 PM Sylvia Taylor is a 52 y.o. female with shortness of breath, wheezing and dyspnea on exertion starting 4 days ago.  3 days ago she was seen at an urgent care.  She was diagnosed with a respiratory illness.  She was given a neb treatment and prescribed Tessalon Perles and an albuterol inhaler.  She states that these have not been helping her symptoms.  She continues to be short of breath, worse with exertion.  She states it feels difficult taking a deep breath.  She is having some soreness in her chest from coughing but no pleuritic pain.  She has not had a fever but states she rarely runs a fever.  When she coughs it causes her head to hurt.   Past Medical History:  Diagnosis Date   Allergy    GERD (gastroesophageal reflux disease)    Hypothyroidism    PONV (postoperative nausea and vomiting)     Past Surgical History:  Procedure Laterality Date   BIOPSY  10/27/2020   Procedure: BIOPSY;  Surgeon: Jeani Hawking, MD;  Location: [facility];  Service: Endoscopy;;   BREAST BIOPSY Left 05/27/2013   CESAREAN SECTION     CHOLECYSTECTOMY N/A 05/11/2020   Procedure: LAPAROSCOPIC CHOLECYSTECTOMY;  Surgeon: Abigail Miyamoto, MD;  [facility] OR;  Service: General;  Laterality: N/A;   COLONOSCOPY WITH PROPOFOL N/A 03/24/2020   Procedure: COLONOSCOPY WITH PROPOFOL;  Surgeon: Jeani Hawking, MD;  Location: [facility];  Service: Endoscopy;  Laterality: N/A;   ESOPHAGOGASTRODUODENOSCOPY (EGD) WITH PROPOFOL N/A 10/27/2020   Procedure: ESOPHAGOGASTRODUODENOSCOPY (EGD) WITH PROPOFOL;  Surgeon: Jeani Hawking, MD;  Location: [facility];  Service: Endoscopy;  Laterality: N/A;   KNEE ARTHROSCOPY Right 09/2014   LIVER BIOPSY N/A 05/11/2020   Procedure: LIVER BIOPSY;  Surgeon:  Abigail Miyamoto, MD;  Location: [facility] OR;  Service: General;  Laterality: N/A;   POLYPECTOMY  03/24/2020   Procedure: POLYPECTOMY;  Surgeon: Jeani Hawking, MD;  Location: [facility];  Service: Endoscopy;;    Family History  Problem Relation Age of Onset   Hypertension Father    Sleep apnea Father    Breast cancer Maternal Grandmother        73s   Cancer Maternal Grandmother        ovarian and thyroid cancer    Social History   Tobacco Use   Smoking status: Never   Smokeless tobacco: Never  Vaping Use   Vaping status: Never Used  Substance Use Topics   Alcohol use: No   Drug use: No    Prior to Admission medications   Medication Sig Start Date End Date Taking? Authorizing Provider  cefpodoxime (VANTIN) 200 MG tablet Take 1 tablet (200 mg total) by mouth 2 (two) times daily for 7 days. 09/11/22 09/18/22 Yes Rashawna Scoles, MD  doxycycline (VIBRAMYCIN) 100 MG capsule Take 1 capsule (100 mg total) by mouth 2 (two) times daily. One po bid x 7 days 09/11/22  Yes Dyke Weible, MD  fluconazole (DIFLUCAN) 150 MG tablet Take 1 tablet as needed for vaginal yeast infection.  May repeat in 3 days if symptoms persist. 09/11/22  Yes Winnona Wargo, Jonny Ruiz, MD  cetirizine (ZYRTEC) 10 MG tablet Take 10 mg by mouth daily as needed for allergies.    [provider]  escitalopram Judye Bos)  10 MG tablet Take 1 tablet (10 mg total) by mouth daily. 08/13/22   Daphine Deutscher, Mary-Margaret, FNP  fluticasone (FLONASE) 50 MCG/ACT nasal spray Place 1 spray into both nostrils as needed for allergies or rhinitis.    [provider]  levothyroxine (SYNTHROID) 75 MCG tablet Take 1 tablet (75 mcg total) by mouth daily. 08/13/22   Daphine Deutscher, Mary-Margaret, FNP  omeprazole (PRILOSEC) 40 MG capsule Take 40 mg by mouth every morning. 02/25/21   [provider]  Probiotic Product (ALOE 40981 & PROBIOTICS PO) Probiotics    [provider]    Allergies Amoxicillin   REVIEW OF SYSTEMS  Negative except as  noted here or in the History of Present Illness.   PHYSICAL EXAMINATION  Initial Vital Signs Blood pressure (!) 144/94, pulse (!) 102, temperature 98.2 F (36.8 C), temperature source Oral, resp. rate (!) 23, last menstrual period 09/13/2015, SpO2 95%.  Examination General: Well-developed, high BMI female in no acute distress; appearance consistent with age of record HENT: normocephalic; atraumatic Eyes: Normal appearance Neck: supple Heart: regular rate and rhythm Lungs: Decreased air movement and right base Abdomen: soft; nondistended; nontender; bowel sounds present Extremities: No deformity; full range of motion; pulses normal Neurologic: Awake, alert and oriented; motor function intact in all extremities and symmetric; no facial droop Skin: Warm and dry Psychiatric: Normal mood and affect   RESULTS  Summary of this visit's results, reviewed and interpreted by myself:   EKG Interpretation Date/Time:  Tuesday September 10 2022 21:38:37 EDT Ventricular Rate:  100 PR Interval:  154 QRS Duration:  72 QT Interval:  324 QTC Calculation: 417 R Axis:   42  Text Interpretation: Normal sinus rhythm Nonspecific T wave abnormality Abnormal ECG When compared with ECG of 19-Oct-2021 12:32, No significant change was found Confirmed by Paula Libra ([id number]) on 09/10/2022 11:55:32 PM       Laboratory Studies: Results for orders placed or performed during the hospital encounter of 09/10/22 (from the past 24 hour(s))  Resp panel by RT-PCR (RSV, Flu A&B, Covid) Anterior Nasal Swab     Status: None   Collection Time: 09/10/22  9:30 PM   Specimen: Anterior Nasal Swab  Result Value Ref Range   SARS Coronavirus 2 by RT PCR NEGATIVE NEGATIVE   Influenza A by PCR NEGATIVE NEGATIVE   Influenza B by PCR NEGATIVE NEGATIVE   Resp Syncytial Virus by PCR NEGATIVE NEGATIVE  CBC with Differential     Status: None   Collection Time: 09/10/22  9:35 PM  Result Value Ref Range   WBC 5.7 4.0 - 10.5 K/uL    RBC 4.69 3.87 - 5.11 MIL/uL   Hemoglobin 12.8 12.0 - 15.0 g/dL   HCT 82.9 56.2 - 13.0 %   MCV 85.5 80.0 - 100.0 fL   MCH 27.3 26.0 - 34.0 pg   MCHC 31.9 30.0 - 36.0 g/dL   RDW 86.5 78.4 - 69.6 %   Platelets 177 150 - 400 K/uL   nRBC 0.0 0.0 - 0.2 %   Neutrophils Relative % 69 %   Neutro Abs 3.9 1.7 - 7.7 K/uL   Lymphocytes Relative 17 %   Lymphs Abs 1.0 0.7 - 4.0 K/uL   Monocytes Relative 10 %   Monocytes Absolute 0.6 0.1 - 1.0 K/uL   Eosinophils Relative 3 %   Eosinophils Absolute 0.2 0.0 - 0.5 K/uL   Basophils Relative 1 %   Basophils Absolute 0.0 0.0 - 0.1 K/uL   Immature Granulocytes 0 %  Abs Immature Granulocytes 0.02 0.00 - 0.07 K/uL  Basic metabolic panel     Status: Abnormal   Collection Time: 09/10/22  9:35 PM  Result Value Ref Range   Sodium 140 135 - 145 mmol/L   Potassium 5.0 3.5 - 5.1 mmol/L   Chloride 102 98 - 111 mmol/L   CO2 30 22 - 32 mmol/L   Glucose, Bld 176 (H) 70 - 99 mg/dL   BUN 11 6 - 20 mg/dL   Creatinine, Ser 1.02 0.44 - 1.00 mg/dL   Calcium 9.3 8.9 - 72.5 mg/dL   GFR, Estimated >36 >64 mL/min   Anion gap 8 5 - 15  Hepatic function panel     Status: None   Collection Time: 09/10/22  9:35 PM  Result Value Ref Range   Total Protein 7.5 6.5 - 8.1 g/dL   Albumin 4.1 3.5 - 5.0 g/dL   AST 24 15 - 41 U/L   ALT 34 0 - 44 U/L   Alkaline Phosphatase 74 38 - 126 U/L   Total Bilirubin 0.4 0.3 - 1.2 mg/dL   Bilirubin, Direct 0.1 0.0 - 0.2 mg/dL   Indirect Bilirubin 0.3 0.3 - 0.9 mg/dL   Imaging Studies: CT Chest Wo Contrast  Result Date: 09/11/2022 CLINICAL DATA:  Pneumonia. EXAM: CT CHEST WITHOUT CONTRAST TECHNIQUE: Multidetector CT imaging of the chest was performed following the standard protocol without IV contrast. RADIATION DOSE REDUCTION: This exam was performed according to the departmental dose-optimization program which includes automated exposure control, adjustment of the mA and/or kV according to patient size and/or use of iterative  reconstruction technique. COMPARISON:  Chest radiograph dated 09/10/2022. FINDINGS: Evaluation of this exam is limited in the absence of intravenous contrast. Cardiovascular: There is no cardiomegaly or pericardial effusion. There is coronary vascular calcification. Mild atherosclerotic calcification of the thoracic aorta. No aneurysmal dilatation. There is mild dilatation of the main pulmonary trunk suggestive of pulmonary hypertension. Clinical correlation is recommended. Mediastinum/Nodes: No hilar or mediastinal adenopathy. The esophagus is grossly unremarkable. No mediastinal fluid collection. Lungs/Pleura: Patchy area of nodular infiltration in the lower lobes bilaterally, right greater than left, most consistent with pneumonia. Aspiration is not excluded. There is no pleural effusion or pneumothorax. The central airways are patent. Upper Abdomen: Enlarged fatty liver. Correlation with clinical exam and LFTs recommended to evaluate for steatohepatitis. Cholecystectomy. Musculoskeletal: No acute osseous pathology. IMPRESSION: 1. Bilateral lower lobe pneumonia, right greater than left. 2. Enlarged fatty liver. Correlation with clinical exam and LFTs recommended to evaluate for steatohepatitis. 3.  Aortic Atherosclerosis (ICD10-I70.0). Electronically Signed   By: Elgie Collard M.D.   On: 09/11/2022 01:06   DG Chest 2 View  Result Date: 09/10/2022 CLINICAL DATA:  Shortness of breath EXAM: CHEST - 2 VIEW COMPARISON:  07/14/2020 FINDINGS: No pleural effusion. Patchy basilar opacity right greater than left. Stable cardiomediastinal silhouette. No pneumothorax IMPRESSION: Patchy basilar opacity right greater than left, possible pneumonia. Electronically Signed   By: Jasmine Pang M.D.   On: 09/10/2022 21:55    ED COURSE and MDM  Nursing notes, initial and subsequent vitals signs, including pulse oximetry, reviewed and interpreted by myself.  Vitals:   09/10/22 2327 09/10/22 2345 09/11/22 0014 09/11/22  0137  BP:  (!) 144/94  135/84  Pulse: 94 (!) 102  97  Resp: 15 (!) 23  (!) 21  Temp:    98.9 F (37.2 C)  TempSrc:    Oral  SpO2: 94% 95% 91% 91%   Medications  ipratropium-albuterol (  DUONEB) 0.5-2.5 (3) MG/3ML nebulizer solution 3 mL (3 mLs Nebulization Given 09/11/22 0014)  cefTRIAXone (ROCEPHIN) 1 g in sodium chloride 0.9 % 100 mL IVPB (1 g Intravenous New Bag/Given 09/11/22 0141)  fluticasone (FLOVENT HFA) 44 MCG/ACT inhaler 2 puff (has no administration in time range)  doxycycline (VIBRA-TABS) tablet 100 mg (100 mg Oral Given 09/11/22 0138)    12:10 AM Oxygen saturation 95% on 2 L by nasal cannula.  This dropped to 92% on room air.  1:11 AM CT scan confirms bilateral lower lobe pneumonia which was equivocal on my evaluation of the plain films.  We will start Rocephin and doxycycline and plan to discharge her home on an oral cephalosporin and Zithromax.  Will start on Flovent as well.  Her sugar is elevated and she has a significant BMI, putting her at risk for diabetes.  I believe systemic steroids are contraindicated given the likelihood of reactive hypoglycemia.  PROCEDURES  Procedures   ED DIAGNOSES     ICD-10-CM   1. Shortness of breath  R06.02     2. Community acquired pneumonia of left lower lobe of lung  J18.9     3. Community acquired pneumonia of right lower lobe of lung  J18.9          Bradie Sangiovanni, MD 09/11/22 [phone]

## 2022-09-10 NOTE — ED Provider Notes (Incomplete)
[facility] Provider Note: Lowella Dell, MD, FACEP  CSN: [id number] MRN: [id number] ARRIVAL: 09/10/22 at 2108 ROOM: DB012/DB012   CHIEF COMPLAINT  Shortness of Breath   HISTORY OF PRESENT ILLNESS  09/10/22 11:54 PM Sylvia Taylor is a 52 y.o. female    Past Medical History:  Diagnosis Date  . Allergy   . GERD (gastroesophageal reflux disease)   . Hypothyroidism   . PONV (postoperative nausea and vomiting)     Past Surgical History:  Procedure Laterality Date  . BIOPSY  10/27/2020   Procedure: BIOPSY;  Surgeon: Jeani Hawking, MD;  Location: [facility];  Service: Endoscopy;;  . BREAST BIOPSY Left 05/27/2013  . CESAREAN SECTION    . CHOLECYSTECTOMY N/A 05/11/2020   Procedure: LAPAROSCOPIC CHOLECYSTECTOMY;  Surgeon: Abigail Miyamoto, MD;  Location: [facility] OR;  Service: General;  Laterality: N/A;  . COLONOSCOPY WITH PROPOFOL N/A 03/24/2020   Procedure: COLONOSCOPY WITH PROPOFOL;  Surgeon: Jeani Hawking, MD;  Location: [facility];  Service: Endoscopy;  Laterality: N/A;  . ESOPHAGOGASTRODUODENOSCOPY (EGD) WITH PROPOFOL N/A 10/27/2020   Procedure: ESOPHAGOGASTRODUODENOSCOPY (EGD) WITH PROPOFOL;  Surgeon: Jeani Hawking, MD;  Location: [facility];  Service: Endoscopy;  Laterality: N/A;  . KNEE ARTHROSCOPY Right 09/2014  . LIVER BIOPSY N/A 05/11/2020   Procedure: LIVER BIOPSY;  Surgeon: Abigail Miyamoto, MD;  Location: [facility] OR;  Service: General;  Laterality: N/A;  . POLYPECTOMY  03/24/2020   Procedure: POLYPECTOMY;  Surgeon: Jeani Hawking, MD;  Location: [facility];  Service: Endoscopy;;    Family History  Problem Relation Age of Onset  . Hypertension Father   . Sleep apnea Father   . Breast cancer Maternal Grandmother        67s  . Cancer Maternal Grandmother        ovarian and thyroid cancer    Social History   Tobacco Use  . Smoking status: Never  . Smokeless tobacco: Never  Vaping Use  . Vaping status: Never Used  Substance Use Topics  . Alcohol  use: No  . Drug use: No    Prior to Admission medications   Medication Sig Start Date End Date Taking? Authorizing Provider  cetirizine (ZYRTEC) 10 MG tablet Take 10 mg by mouth daily as needed for allergies.    [provider]  escitalopram (LEXAPRO) 10 MG tablet Take 1 tablet (10 mg total) by mouth daily. 08/13/22   Daphine Deutscher, Mary-Margaret, FNP  fluticasone (FLONASE) 50 MCG/ACT nasal spray Place 1 spray into both nostrils as needed for allergies or rhinitis.    [provider]  levothyroxine (SYNTHROID) 75 MCG tablet Take 1 tablet (75 mcg total) by mouth daily. 08/13/22   Daphine Deutscher, Mary-Margaret, FNP  omeprazole (PRILOSEC) 40 MG capsule Take 40 mg by mouth every morning. 02/25/21   [provider]  Probiotic Product (ALOE 44034 & PROBIOTICS PO) Probiotics    [provider]    Allergies Amoxicillin   REVIEW OF SYSTEMS  Negative except as noted here or in the History of Present Illness.   PHYSICAL EXAMINATION  Initial Vital Signs Blood pressure (!) 144/94, pulse (!) 102, temperature 98.2 F (36.8 C), temperature source Oral, resp. rate (!) 23, last menstrual period 09/13/2015, SpO2 95%.  Examination General: Well-developed, well-nourished female in no acute distress; appearance consistent with age of record HENT: normocephalic; atraumatic Eyes: pupils equal, round and reactive to light; extraocular muscles intact Neck: supple Heart: regular rate and rhythm; no murmurs, rubs or gallops Lungs: clear to auscultation bilaterally Abdomen: soft; nondistended;  nontender; no masses or hepatosplenomegaly; bowel sounds present Extremities: No deformity; full range of motion; pulses normal Neurologic: Awake, alert and oriented; motor function intact in all extremities and symmetric; no facial droop Skin: Warm and dry Psychiatric: Normal mood and affect   RESULTS  Summary of this visit's results, reviewed and interpreted by myself:   EKG  Interpretation Date/Time:    Ventricular Rate:    PR Interval:    QRS Duration:    QT Interval:    QTC Calculation:   R Axis:      Text Interpretation:         Laboratory Studies: Results for orders placed or performed during the hospital encounter of 09/10/22 (from the past 24 hour(s))  Resp panel by RT-PCR (RSV, Flu A&B, Covid) Anterior Nasal Swab     Status: None   Collection Time: 09/10/22  9:30 PM   Specimen: Anterior Nasal Swab  Result Value Ref Range   SARS Coronavirus 2 by RT PCR NEGATIVE NEGATIVE   Influenza A by PCR NEGATIVE NEGATIVE   Influenza B by PCR NEGATIVE NEGATIVE   Resp Syncytial Virus by PCR NEGATIVE NEGATIVE  CBC with Differential     Status: None   Collection Time: 09/10/22  9:35 PM  Result Value Ref Range   WBC 5.7 4.0 - 10.5 K/uL   RBC 4.69 3.87 - 5.11 MIL/uL   Hemoglobin 12.8 12.0 - 15.0 g/dL   HCT 53.6 64.4 - 03.4 %   MCV 85.5 80.0 - 100.0 fL   MCH 27.3 26.0 - 34.0 pg   MCHC 31.9 30.0 - 36.0 g/dL   RDW 74.2 59.5 - 63.8 %   Platelets 177 150 - 400 K/uL   nRBC 0.0 0.0 - 0.2 %   Neutrophils Relative % 69 %   Neutro Abs 3.9 1.7 - 7.7 K/uL   Lymphocytes Relative 17 %   Lymphs Abs 1.0 0.7 - 4.0 K/uL   Monocytes Relative 10 %   Monocytes Absolute 0.6 0.1 - 1.0 K/uL   Eosinophils Relative 3 %   Eosinophils Absolute 0.2 0.0 - 0.5 K/uL   Basophils Relative 1 %   Basophils Absolute 0.0 0.0 - 0.1 K/uL   Immature Granulocytes 0 %   Abs Immature Granulocytes 0.02 0.00 - 0.07 K/uL  Basic metabolic panel     Status: Abnormal   Collection Time: 09/10/22  9:35 PM  Result Value Ref Range   Sodium 140 135 - 145 mmol/L   Potassium 5.0 3.5 - 5.1 mmol/L   Chloride 102 98 - 111 mmol/L   CO2 30 22 - 32 mmol/L   Glucose, Bld 176 (H) 70 - 99 mg/dL   BUN 11 6 - 20 mg/dL   Creatinine, Ser 7.56 0.44 - 1.00 mg/dL   Calcium 9.3 8.9 - 43.3 mg/dL   GFR, Estimated >29 >51 mL/min   Anion gap 8 5 - 15   Imaging Studies: DG Chest 2 View  Result Date:  09/10/2022 CLINICAL DATA:  Shortness of breath EXAM: CHEST - 2 VIEW COMPARISON:  07/14/2020 FINDINGS: No pleural effusion. Patchy basilar opacity right greater than left. Stable cardiomediastinal silhouette. No pneumothorax IMPRESSION: Patchy basilar opacity right greater than left, possible pneumonia. Electronically Signed   By: Jasmine Pang M.D.   On: 09/10/2022 21:55    ED COURSE and MDM  Nursing notes, initial and subsequent vitals signs, including pulse oximetry, reviewed and interpreted by myself.  Vitals:   09/10/22 2123 09/10/22 2326 09/10/22 2327 09/10/22 2345  BP:  (!) 150/88  (!) 144/94  Pulse:  92 94 (!) 102  Resp:  13 15 (!) 23  Temp:      TempSrc:      SpO2: 94% 92% 94% 95%   Medications - No data to display    PROCEDURES  Procedures   ED DIAGNOSES  No diagnosis found.

## 2022-09-11 ENCOUNTER — Emergency Department (HOSPITAL_BASED_OUTPATIENT_CLINIC_OR_DEPARTMENT_OTHER): Payer: BC Managed Care – PPO

## 2022-09-11 LAB — HEPATIC FUNCTION PANEL
ALT: 34 U/L (ref 0–44)
AST: 24 U/L (ref 15–41)
Albumin: 4.1 g/dL (ref 3.5–5.0)
Alkaline Phosphatase: 74 U/L (ref 38–126)
Bilirubin, Direct: 0.1 mg/dL (ref 0.0–0.2)
Indirect Bilirubin: 0.3 mg/dL (ref 0.3–0.9)
Total Bilirubin: 0.4 mg/dL (ref 0.3–1.2)
Total Protein: 7.5 g/dL (ref 6.5–8.1)

## 2022-09-11 MED ORDER — DOXYCYCLINE HYCLATE 100 MG PO TABS
100.0000 mg | ORAL_TABLET | Freq: Once | ORAL | Status: AC
  Administered 2022-09-11: 100 mg via ORAL
  Filled 2022-09-11: qty 1

## 2022-09-11 MED ORDER — IPRATROPIUM-ALBUTEROL 0.5-2.5 (3) MG/3ML IN SOLN
3.0000 mL | RESPIRATORY_TRACT | Status: DC
  Administered 2022-09-11: 3 mL via RESPIRATORY_TRACT
  Filled 2022-09-11: qty 3

## 2022-09-11 MED ORDER — FLUTICASONE PROPIONATE HFA 44 MCG/ACT IN AERO
2.0000 | INHALATION_SPRAY | Freq: Two times a day (BID) | RESPIRATORY_TRACT | Status: DC
  Administered 2022-09-11: 2 via RESPIRATORY_TRACT
  Filled 2022-09-11: qty 10.6

## 2022-09-11 MED ORDER — SODIUM CHLORIDE 0.9 % IV SOLN
1.0000 g | Freq: Once | INTRAVENOUS | Status: AC
  Administered 2022-09-11: 1 g via INTRAVENOUS
  Filled 2022-09-11: qty 10

## 2022-09-11 MED ORDER — FLUCONAZOLE 150 MG PO TABS: ORAL_TABLET | ORAL | 0 refills | Status: DC

## 2022-09-11 MED ORDER — DOXYCYCLINE HYCLATE 100 MG PO CAPS: 100.0000 mg | ORAL_CAPSULE | Freq: Two times a day (BID) | ORAL | 0 refills | Status: DC

## 2022-09-11 MED ORDER — CEFPODOXIME PROXETIL 200 MG PO TABS: 200.0000 mg | ORAL_TABLET | Freq: Two times a day (BID) | ORAL | 0 refills | Status: AC

## 2022-09-11 NOTE — ED Notes (Signed)
Patient provided with spacer for home use. RT explained use/benefits of using spacer with inhaler. Patient demonstrated use of spacer appropriately.  Patient informed of need to rinse mouth following Flovent inhaler use. Pt verbalized understanding.

## 2022-09-12 ENCOUNTER — Telehealth: Payer: Self-pay

## 2022-09-12 NOTE — Transitions of Care (Post Inpatient/ED Visit) (Signed)
   09/12/2022  Name: Sylvia Taylor MRN: [id number] DOB: [birth year]  Today's TOC FU Call Status: Today's TOC FU Call Status:: Successful TOC FU Call Completed TOC FU Call Complete Date: 09/12/22  Transition Care Management Follow-up Telephone Call Date of Discharge: 09/11/22 Discharge Facility: [facility] Type of Discharge: Emergency Department Reason for ED Visit: Other: [facility]) How have you been since you were released from the hospital?: Better Any questions or concerns?: No  Items Reviewed: Did you receive and understand the discharge instructions provided?: Yes Medications obtained,verified, and reconciled?: Yes (Medications Reviewed) Any new allergies since your discharge?: No Dietary orders reviewed?: Yes Do you have support at home?: Yes People in Home: spouse  Medications Reviewed Today: Medications Reviewed Today     Reviewed by Karena Addison, LPN (Licensed Practical Nurse) on 09/12/22 at 1042  Med List Status: <None>   Medication Order Taking? Sig Documenting Provider Last Dose Status Informant  cefpodoxime (VANTIN) 200 MG tablet [id number]  Take 1 tablet (200 mg total) by mouth 2 (two) times daily for 7 days. Molpus, John, MD  Active   cetirizine (ZYRTEC) 10 MG tablet [id number] No Take 10 mg by mouth daily as needed for allergies. [provider] Taking Active Self  doxycycline (VIBRAMYCIN) 100 MG capsule [id number]  Take 1 capsule (100 mg total) by mouth 2 (two) times daily. One po bid x 7 days Molpus, John, MD  Active   escitalopram (LEXAPRO) 10 MG tablet [id number]  Take 1 tablet (10 mg total) by mouth daily. Daphine Deutscher, Mary-Margaret, FNP  Active   fluconazole (DIFLUCAN) 150 MG tablet [id number]  Take 1 tablet as needed for vaginal yeast infection.  May repeat in 3 days if symptoms persist. Molpus, John, MD  Active   fluticasone (FLONASE) 50 MCG/ACT nasal spray [id number] No Place 1 spray into both nostrils as needed for allergies or rhinitis.  [provider] Taking Active Self  levothyroxine (SYNTHROID) 75 MCG tablet [id number]  Take 1 tablet (75 mcg total) by mouth daily. Daphine Deutscher, Mary-Margaret, FNP  Active   omeprazole (PRILOSEC) 40 MG capsule [id number] No Take 40 mg by mouth every morning. [provider] Taking Active   Probiotic Product (ALOE 73220 & PROBIOTICS PO) [id number] No Probiotics [provider] Taking Active             Home Care and Equipment/Supplies: Were Home Health Services Ordered?: NA Any new equipment or medical supplies ordered?: NA  Functional Questionnaire: Do you need assistance with bathing/showering or dressing?: No Do you need assistance with meal preparation?: No Do you need assistance with eating?: No Do you have difficulty maintaining continence: No Do you need assistance with getting out of bed/getting out of a chair/moving?: No Do you have difficulty managing or taking your medications?: No  Follow up appointments reviewed: PCP Follow-up appointment confirmed?: No (declined appt) MD Provider Line Number:[phone] Given: No Specialist Hospital Follow-up appointment confirmed?: NA Do you need transportation to your follow-up appointment?: No Do you understand care options if your condition(s) worsen?: Yes-patient verbalized understanding    SIGNATURE Karena Addison, LPN [facility] Nurse Health Advisor Direct Dial [phone]

## 2022-09-17 ENCOUNTER — Encounter: Payer: Self-pay | Admitting: Nurse Practitioner

## 2022-09-17 ENCOUNTER — Ambulatory Visit: Payer: BC Managed Care – PPO | Admitting: Nurse Practitioner

## 2022-09-17 VITALS — BP 119/79 | HR 77 | Temp 96.0°F | Ht 65.0 in | Wt 389.2 lb

## 2022-09-17 DIAGNOSIS — J189 Pneumonia, unspecified organism: Secondary | ICD-10-CM | POA: Diagnosis not present

## 2022-09-17 DIAGNOSIS — Z09 Encounter for follow-up examination after completed treatment for conditions other than malignant neoplasm: Secondary | ICD-10-CM

## 2022-09-17 DIAGNOSIS — Z7689 Persons encountering health services in other specified circumstances: Secondary | ICD-10-CM

## 2022-09-17 NOTE — Patient Instructions (Signed)
Community-Acquired Pneumonia, Adult Pneumonia is a lung infection that causes inflammation and the buildup of mucus and fluids in the lungs. This may cause coughing and difficulty breathing. Community-acquired pneumonia is pneumonia that develops in people who are not, and have not recently been, in a hospital or other health care facility. Usually, pneumonia develops as a result of an illness that is caused by a virus, such as the common cold and the flu (influenza). It can also be caused by bacteria or fungi. While the common cold and influenza can pass from person to person (are contagious), pneumonia itself is not considered contagious. What are the causes? This condition may be caused by: Viruses. Bacteria. Fungi. What increases the risk? The following factors may make you more likely to develop this condition: Being over age 65 or having certain medical conditions, such as: A long-term (chronic) disease, such as: chronic obstructive pulmonary disease (COPD), asthma, heart failure, diabetes, or kidney disease. A condition that increases the risk of breathing in (aspirating) mucus and other fluids from your mouth and nose. A weakened body defense system (immune system). Having had your spleen removed (splenectomy). The spleen is the organ that helps fight germs and infections. Not cleaning your teeth and gums well (poor dental hygiene). Using tobacco products. Traveling to places where germs that cause pneumonia are present or being near certain animals or animal habitats that could have germs that cause pneumonia. What are the signs or symptoms? Symptoms of this condition include: A dry cough or a wet (productive) cough. A fever, sweating, or chills. Chest pain, especially when breathing deeply or coughing. Fast breathing, difficulty breathing, or shortness of breath. Tiredness (fatigue) and muscle aches. How is this diagnosed? This condition may be diagnosed based on your medical  history or a physical exam. You may also have tests, including: Imaging, such as a chest X-ray or lung ultrasound. Tests of: The level of oxygen and other gases in your blood. Mucus from your lungs (sputum). Fluid around your lungs (pleural fluid). Your urine. How is this treated? Treatment for this condition depends on many factors, such as the cause of your pneumonia, your medicines, and other medical conditions that you have. For most adults, pneumonia may be treated at home. In some cases, treatment must happen in a hospital and may include: Medicines that are given by mouth (orally) or through an IV, including: Antibiotic medicines, if bacteria caused the pneumonia. Medicines that kill viruses (antiviral medicines), if a virus caused the pneumonia. Oxygen therapy. Severe pneumonia, although rare, may require the following treatments: Mechanical ventilation.This procedure uses a machine to help you breathe if you cannot breathe well on your own or maintain a safe level of blood oxygen. Thoracentesis. This procedure removes any buildup of pleural fluid to help with breathing. Follow these instructions at home:  Medicines Take over-the-counter and prescription medicines only as told by your health care provider. Take cough medicine only if you have trouble sleeping. Cough medicine can prevent your body from removing mucus from your lungs. If you were prescribed antibiotics, take them as told by your health care provider. Do not stop taking the antibiotic even if you start to feel better. Lifestyle     Do not drink alcohol. Do not use any products that contain nicotine or tobacco. These products include cigarettes, chewing tobacco, and vaping devices, such as e-cigarettes. If you need help quitting, ask your health care provider. Eat a healthy diet. This includes plenty of vegetables, fruits, whole grains, low-fat   dairy products, and lean protein. General instructions Rest a lot and  get at least 8 hours of sleep each night. Sleep in a partly upright position at night. Place a few pillows under your head or sleep in a reclining chair. Return to your normal activities as told by your health care provider. Ask your health care provider what activities are safe for you. Drink enough fluid to keep your urine pale yellow. This helps to thin the mucus in your lungs. If your throat is sore, gargle with a mixture of salt and water 3-4 times a day or as needed. To make salt water, completely dissolve -1 tsp (3-6 g) of salt in 1 cup (237 mL) of warm water. Keep all follow-up visits. How is this prevented? You can lower your risk of developing community-acquired pneumonia by: Getting the pneumonia vaccine. There are different types and schedules of pneumonia vaccines. Ask your health care provider which option is best for you. Consider getting the pneumonia vaccine if: You are older than 50 years of age. You are 19-65 years of age and are receiving cancer treatment, have chronic lung disease, or have other medical conditions that affect your immune system. Ask your health care provider if this applies to you. Getting your influenza vaccine every year. Ask your health care provider which type of vaccine is best for you. Getting regular dental checkups. Washing your hands often with soap and water for at least 20 seconds. If soap and water are not available, use hand sanitizer. Contact a health care provider if: You have a fever. You have trouble sleeping because you cannot control your cough with cough medicine. Get help right away if: Your shortness of breath becomes worse. Your chest pain increases. Your sickness becomes worse, especially if you are an older adult or have a weak immune system. You cough up blood. These symptoms may be an emergency. Get help right away. Call 911. Do not wait to see if the symptoms will go away. Do not drive yourself to the  hospital. Summary Pneumonia is an infection of the lungs. Community-acquired pneumonia develops in people who have not been in the hospital. It can be caused by bacteria, viruses, or fungi. This condition may be treated with antibiotics or antiviral medicines. Severe pneumonia may require a hospital stay and treatment to help with breathing. This information is not intended to replace advice given to you by your health care provider. Make sure you discuss any questions you have with your health care provider. Document Revised: 03/28/2021 Document Reviewed: 03/28/2021 Elsevier Patient Education  2024 [employer] Inc[employer]  

## 2022-09-17 NOTE — Progress Notes (Signed)
Subjective:    Patient ID: Sylvia Taylor, female    DOB: Nov 30, 1970, 52 y.o.   MRN: [id number]  Today's visit was for Transitional Care Management.  The patient was discharged from [facility] on 09/11/22 with a primary diagnosis of pneumonia.   Contact with the patient and/or caregiver, by a clinical staff member, was made on 09/12/22 and was documented as a telephone encounter within the EMR.  Through chart review and discussion with the patient I have determined that management of their condition is of low complexity.   Patient went to the urgent Care  c/o SOB on 09/06/22. Was dx with respiratory illness and was given tessalon perles and albuterol inhaler. The sob continued and she went to the ED at [facility] There chest xray revealed pneumonia. She was discharged home on cephalosporin and zithromax. She is much better today. SOB has improved. She denies and fever.      Review of Systems  Constitutional:  Negative for diaphoresis.  Eyes:  Negative for pain.  Respiratory:  Negative for shortness of breath.   Cardiovascular:  Negative for chest pain, palpitations and leg swelling.  Gastrointestinal:  Negative for abdominal pain.  Endocrine: Negative for polydipsia.  Skin:  Negative for rash.  Neurological:  Negative for dizziness, weakness and headaches.  Hematological:  Does not bruise/bleed easily.  All other systems reviewed and are negative.      Objective:   Physical Exam Vitals and nursing note reviewed.  Constitutional:      General: She is not in acute distress.    Appearance: Normal appearance. She is well-developed.  HENT:     Head: Normocephalic.     Right Ear: Tympanic membrane normal.     Left Ear: Tympanic membrane normal.     Nose: Nose normal.     Mouth/Throat:     Mouth: Mucous membranes are moist.  Eyes:     Pupils: Pupils are equal, round, and reactive to light.  Neck:     Vascular: No carotid bruit or JVD.  Cardiovascular:     Rate and Rhythm:  Normal rate and regular rhythm.     Heart sounds: Normal heart sounds.  Pulmonary:     Effort: Pulmonary effort is normal. No respiratory distress.     Breath sounds: Normal breath sounds. No wheezing or rales.  Chest:     Chest wall: No tenderness.  Abdominal:     General: There is no abdominal bruit.     Palpations: There is no hepatomegaly, splenomegaly or pulsatile mass.  Musculoskeletal:        General: Normal range of motion.     Cervical back: Normal range of motion and neck supple.  Lymphadenopathy:     Cervical: No cervical adenopathy.  Skin:    General: Skin is warm and dry.  Neurological:     Mental Status: She is alert and oriented to person, place, and time.     Deep Tendon Reflexes: Reflexes are normal and symmetric.  Psychiatric:        Behavior: Behavior normal.        Thought Content: Thought content normal.        Judgment: Judgment normal.    BP 119/79   Pulse 77   Temp (!) 96 F (35.6 C) (Temporal)   Ht 5\' 5"  (1.651 m)   Wt (!) 389 lb 3.2 oz (176.5 kg)   LMP 09/13/2015   SpO2 90%   BMI 64.77 kg/m  Assessment & Plan:   Sylvia Taylor in today with chief complaint of Transitions Of Care   1. Encounter for support and coordination of transition of care Hospital records reviewed  2. Community acquired bilateral lower lobe pneumonia Continue all meds till gone Force fluids Repeat chest xray in 5 weeks.    The above assessment and management plan was discussed with the patient. The patient verbalized understanding of and has agreed to the management plan. Patient is aware to call the clinic if symptoms persist or worsen. Patient is aware when to return to the clinic for a follow-up visit. Patient educated on when it is appropriate to go to the emergency department.   [name], FNP

## 2022-09-21 IMAGING — CT CT ABD-PELV W/ CM
1 of 3 series · 13 of 32 positions shown, 19 images · IV contrast (iopamidol)
Comparison: CT November 29, 2014

CLINICAL DATA: Left lower and left upper quadrant pain intermittent
x8 months.

EXAM:
CT ABDOMEN AND PELVIS WITH CONTRAST
TECHNIQUE: Multidetector CT imaging of the abdomen and pelvis was performed
using the standard protocol following bolus administration of
intravenous contrast.
CONTRAST:  100mL LOCRDR-F66 IOPAMIDOL (LOCRDR-F66) INJECTION 61%

[Series 2: abd/pelvis w/cm · axial · 0.98mm/px · z∈[-534,-129]mm · 13 of 95 slices shown, 19 images]
[im 7/95  soft-tissue]
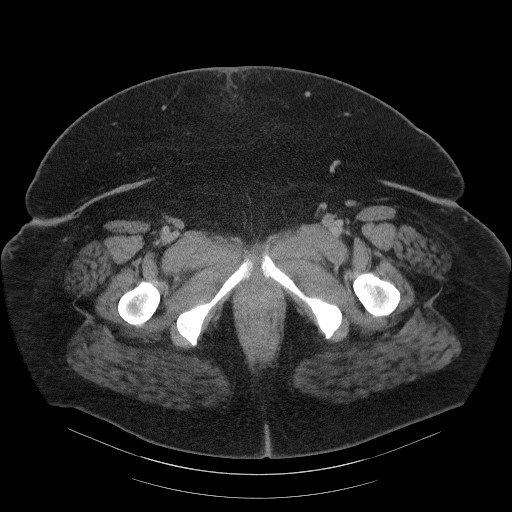
[im 7/95  bone]
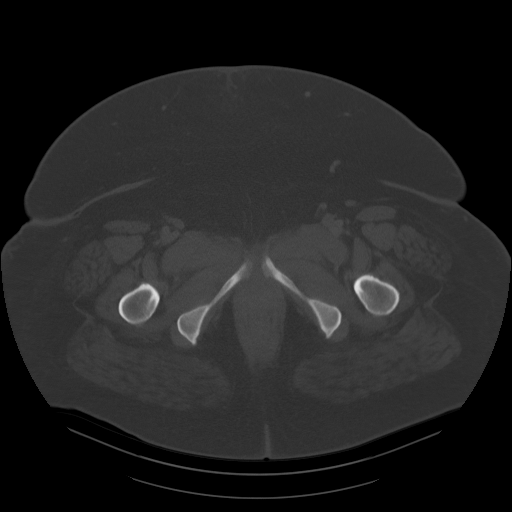
[im 14/95  soft-tissue]
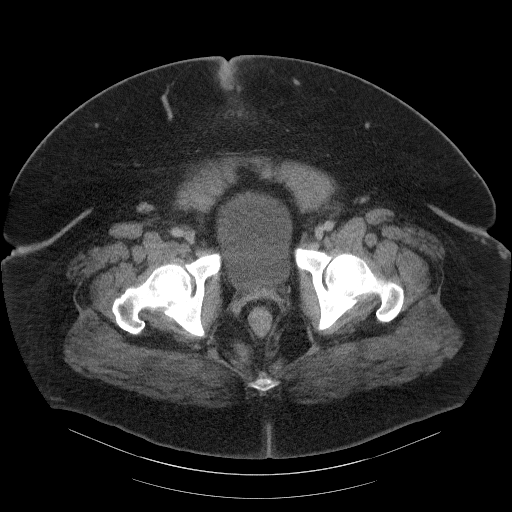
[im 21/95  soft-tissue]
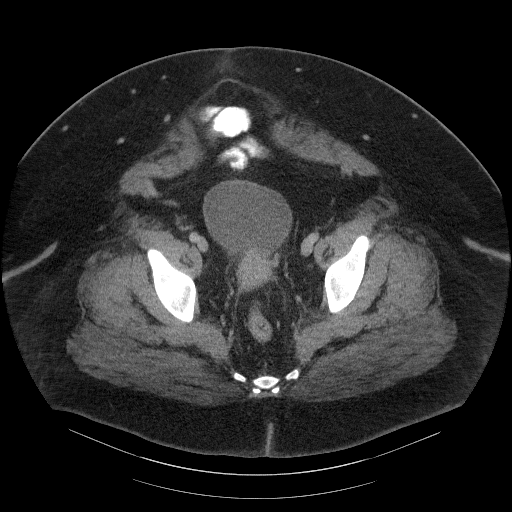
[im 27/95  soft-tissue]
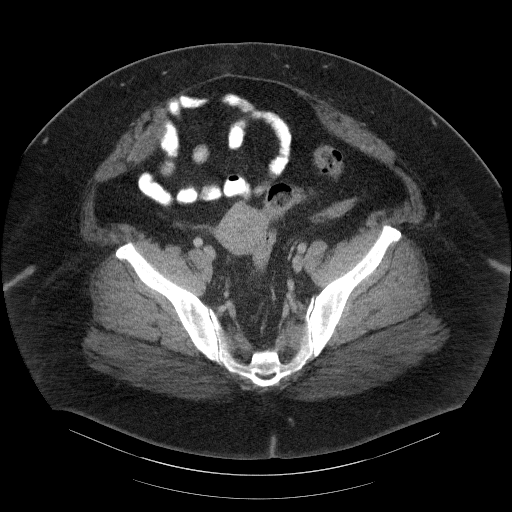
[im 34/95  soft-tissue]
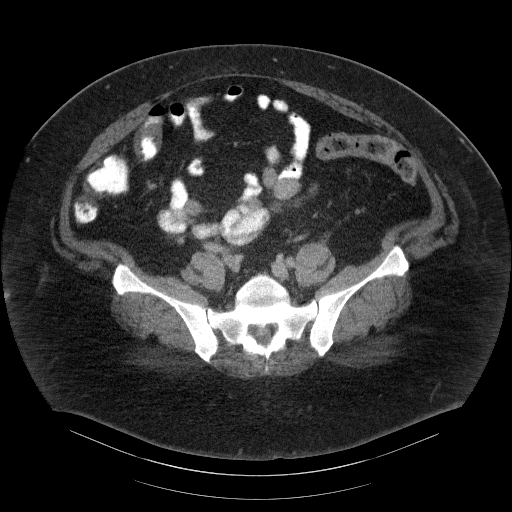
[im 41/95  soft-tissue]
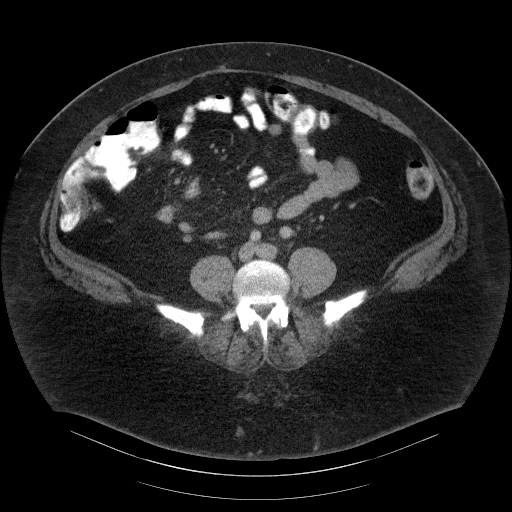
[im 48/95  soft-tissue]
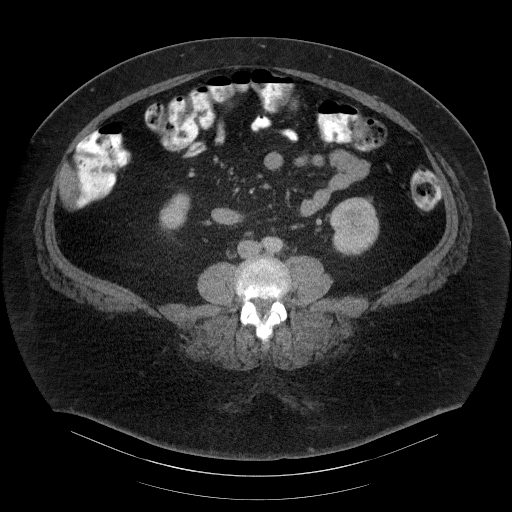
[im 54/95  soft-tissue]
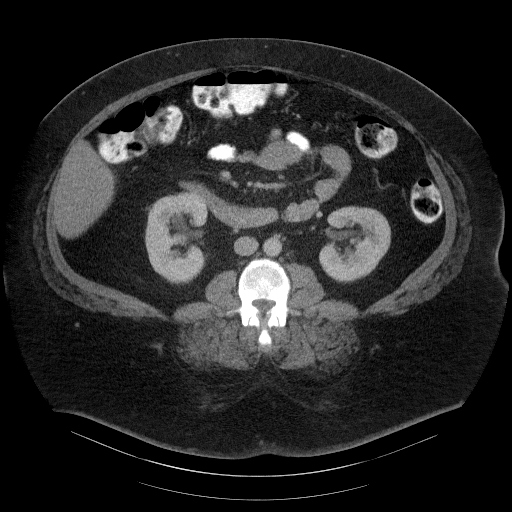
[im 61/95  soft-tissue]
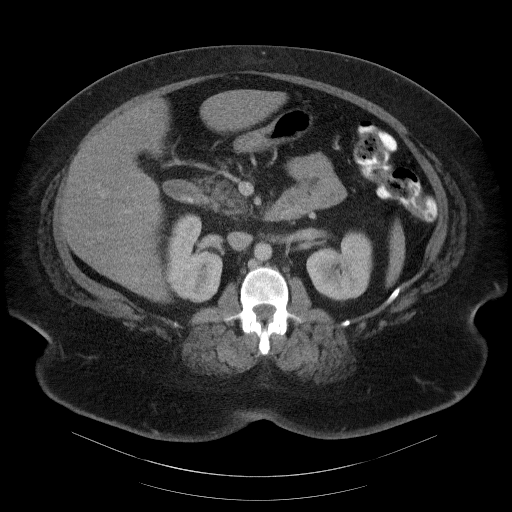
[im 61/95  bone]
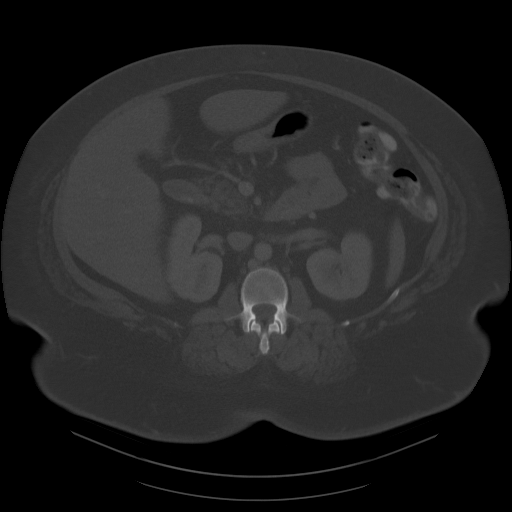
[im 68/95  soft-tissue]
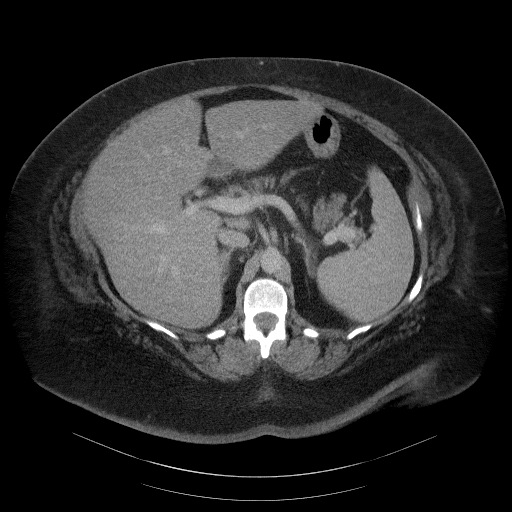
[im 68/95  lung]
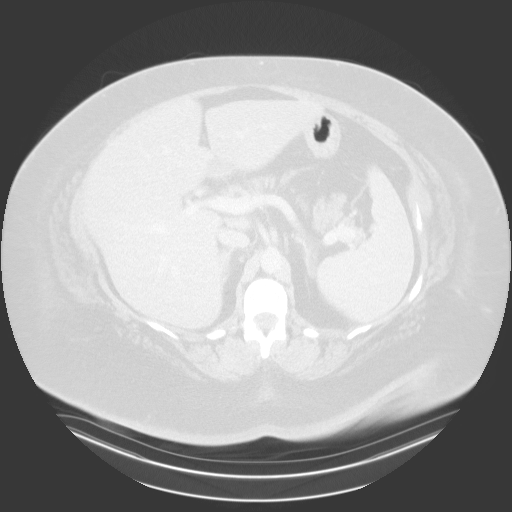
[im 74/95  soft-tissue]
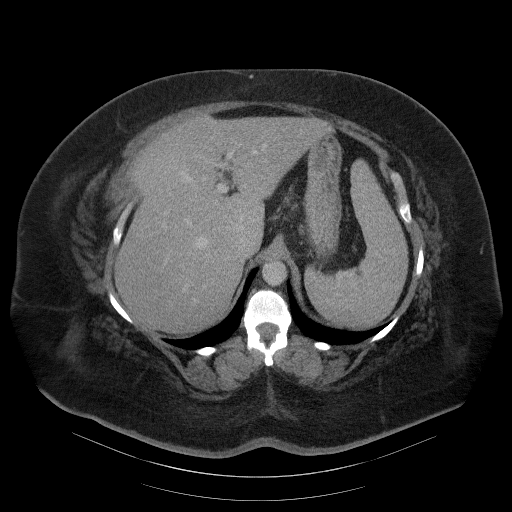
[im 74/95  lung]
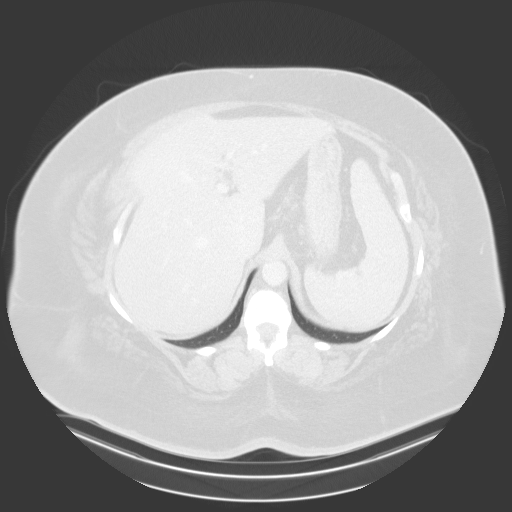
[im 81/95  soft-tissue]
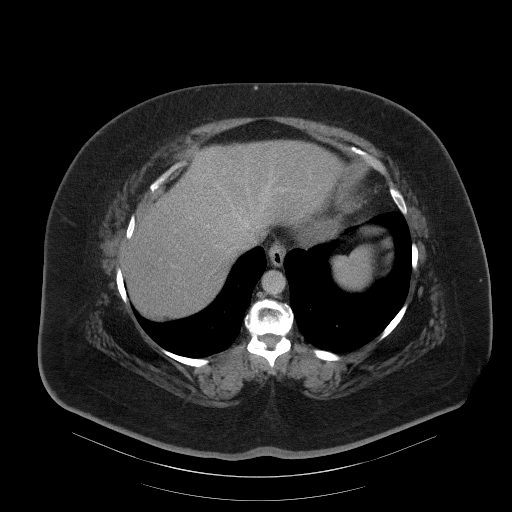
[im 81/95  lung]
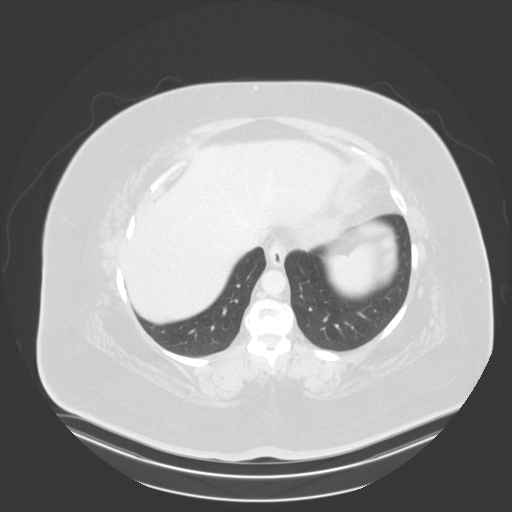
[im 88/95  soft-tissue]
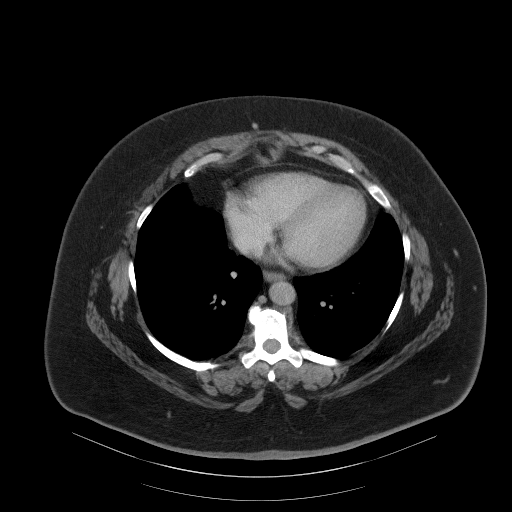
[im 88/95  lung]
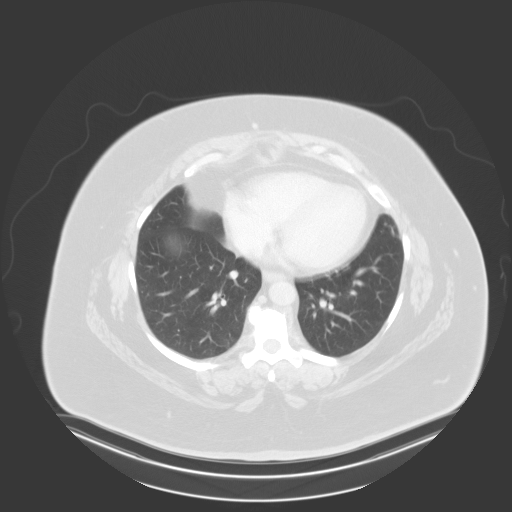

[13 of 32 positions shown; findings below may reference images not displayed]

FINDINGS: Lower chest: No acute abnormality.

Hepatobiliary: Hepatomegaly with diffuse hepatic steatosis.
Gallbladder surgically absent. No biliary ductal dilation.

Pancreas: Fatty atrophy of the head of the pancreas. No evidence of
acute pancreatic inflammation. No pancreatic ductal dilation.

Spleen: Within normal limits.

Adrenals/Urinary Tract: Bilateral adrenal glands are unremarkable.
No hydronephrosis. No solid enhancing renal mass. Urinary bladder is
unremarkable for degree of distension.

Stomach/Bowel: Radiopaque enteric contrast traverses the descending
colon. Small hiatal hernia otherwise the stomach is unremarkable for
degree of distension. No pathologic dilation of small or large
bowel. The appendix and terminal ileum appear unremarkable. No
evidence of acute bowel inflammation.

Vascular/Lymphatic: No abdominal aortic aneurysm. No pathologically
enlarged abdominal or pelvic lymph nodes. Prominent periportal lymph
nodes measuring up to 10 mm, likely reactive.

Reproductive: Uterus and bilateral adnexa are unremarkable.

Other: Fat containing umbilical hernia. No significant
abdominopelvic ascites.

Musculoskeletal: Mild multilevel degenerative changes spine. No
acute osseous abnormality.
IMPRESSION: 1. No acute findings in the abdomen or pelvis.
2. Hepatomegaly with diffuse hepatic steatosis.
3. Fat containing umbilical hernia.

## 2022-09-26 ENCOUNTER — Ambulatory Visit
Admission: RE | Admit: 2022-09-26 | Discharge: 2022-09-26 | Disposition: A | Discharge status: Home / Self Care | Payer: BC Managed Care – PPO | Source: Ambulatory Visit | Attending: Nurse Practitioner | Admitting: Nurse Practitioner

## 2022-09-26 DIAGNOSIS — Z1231 Encounter for screening mammogram for malignant neoplasm of breast: Secondary | ICD-10-CM

## 2022-10-22 ENCOUNTER — Ambulatory Visit (INDEPENDENT_AMBULATORY_CARE_PROVIDER_SITE_OTHER): Payer: BC Managed Care – PPO

## 2022-10-22 ENCOUNTER — Ambulatory Visit: Payer: BC Managed Care – PPO | Admitting: Nurse Practitioner

## 2022-10-22 ENCOUNTER — Encounter: Payer: Self-pay | Admitting: Nurse Practitioner

## 2022-10-22 VITALS — BP 145/86 | HR 83 | Temp 97.0°F | Resp 20 | Ht 65.0 in | Wt 395.0 lb

## 2022-10-22 DIAGNOSIS — J189 Pneumonia, unspecified organism: Secondary | ICD-10-CM

## 2022-10-22 NOTE — Progress Notes (Signed)
   Subjective:    Patient ID: Sylvia Taylor, female    DOB: 11/23/1970, 52 y.o.   MRN: [id number]   Chief Complaint: pneumonia follow up  HPI  Patient was dx with pneumonia 6 weeks ago. Is much better. Is here today just for repeat chest xray.  Patient Active Problem List   Diagnosis Date Noted   GAD (generalized anxiety disorder) 02/14/2022   Obstructive sleep apnea 04/20/2017   Severe obesity (BMI >= 40) (HCC) 06/06/2014   Fluid retention in legs 06/02/2013   Hypothyroidism 11/03/2012   Allergic rhinitis 11/03/2012       Review of Systems  Constitutional:  Negative for diaphoresis.  Eyes:  Negative for pain.  Respiratory:  Negative for shortness of breath.   Cardiovascular:  Negative for chest pain, palpitations and leg swelling.  Gastrointestinal:  Negative for abdominal pain.  Endocrine: Negative for polydipsia.  Skin:  Negative for rash.  Neurological:  Negative for dizziness, weakness and headaches.  Hematological:  Does not bruise/bleed easily.  All other systems reviewed and are negative.      Objective:   Physical Exam Vitals and nursing note reviewed.  Constitutional:      General: She is not in acute distress.    Appearance: Normal appearance. She is well-developed.  Neck:     Vascular: No carotid bruit or JVD.  Cardiovascular:     Rate and Rhythm: Normal rate and regular rhythm.     Heart sounds: Normal heart sounds.  Pulmonary:     Effort: Pulmonary effort is normal. No respiratory distress.     Breath sounds: Normal breath sounds. No wheezing or rales.  Chest:     Chest wall: No tenderness.  Abdominal:     General: There is no distension or abdominal bruit.     Palpations: There is no hepatomegaly, splenomegaly, mass or pulsatile mass.     Tenderness: There is no abdominal tenderness.  Musculoskeletal:        General: Normal range of motion.     Cervical back: Normal range of motion and neck supple.  Lymphadenopathy:     Cervical: No cervical  adenopathy.  Skin:    General: Skin is warm and dry.  Neurological:     Mental Status: She is alert and oriented to person, place, and time.     Deep Tendon Reflexes: Reflexes are normal and symmetric.  Psychiatric:        Behavior: Behavior normal.        Thought Content: Thought content normal.        Judgment: Judgment normal.     Chest xray- no acute or chronic findings-Preliminary reading by Paulene Floor, FNP  [facility]       Assessment & Plan:   Sylvia Taylor in today with chief complaint of Recheck pneumonia   1. Community acquired bilateral lower lobe pneumonia Resolved Follow up prn - DG Chest 2 View; Future    The above assessment and management plan was discussed with the patient. The patient verbalized understanding of and has agreed to the management plan. Patient is aware to call the clinic if symptoms persist or worsen. Patient is aware when to return to the clinic for a follow-up visit. Patient educated on when it is appropriate to go to the emergency department.   [name], FNP

## 2022-11-12 MED ORDER — OMEPRAZOLE 40 MG PO CPDR: 40.0000 mg | DELAYED_RELEASE_CAPSULE | Freq: Every morning | ORAL | 1 refills | Status: DC

## 2022-12-26 ENCOUNTER — Ambulatory Visit: Payer: BC Managed Care – PPO | Admitting: Nurse Practitioner

## 2022-12-26 ENCOUNTER — Encounter: Payer: Self-pay | Admitting: Nurse Practitioner

## 2022-12-26 VITALS — BP 148/89 | HR 72 | Temp 97.1°F | Resp 20 | Ht 65.0 in | Wt >= 6400 oz

## 2022-12-26 DIAGNOSIS — M5432 Sciatica, left side: Secondary | ICD-10-CM | POA: Diagnosis not present

## 2022-12-26 MED ORDER — KETOROLAC TROMETHAMINE 60 MG/2ML IM SOLN
60.0000 mg | Freq: Once | INTRAMUSCULAR | Status: AC
Start: 2022-12-26 — End: 2022-12-26
  Administered 2022-12-26: 60 mg via INTRAMUSCULAR

## 2022-12-26 MED ORDER — METHYLPREDNISOLONE ACETATE 80 MG/ML IJ SUSP
80.0000 mg | Freq: Once | INTRAMUSCULAR | Status: AC
Start: 2022-12-26 — End: 2022-12-26
  Administered 2022-12-26: 80 mg via INTRAMUSCULAR

## 2022-12-26 NOTE — Patient Instructions (Signed)
Sciatica  Sciatica is pain, weakness, tingling, or loss of feeling (numbness) along the sciatic nerve. The sciatic nerve starts in the lower back and goes down the back of each leg. Sciatica usually affects one side of the body. Sciatica usually goes away on its own or with treatment. Sometimes, sciatica may come back. What are the causes? This condition happens when the sciatic nerve is pinched or has pressure put on it. This may be caused by: A disk in between the bones of the spine bulging out too far (herniated disk). Changes in the spinal disks due to aging. A condition that affects a muscle in the butt. Extra bone growth near the sciatic nerve. A break (fracture) of the area between your hip bones (pelvis). Pregnancy. Tumor. This is rare. What increases the risk? You are more likely to develop this condition if you: Play sports that put pressure or stress on the spine. Have poor strength and ease of movement (flexibility). Have had a back injury or back surgery. Sit for long periods of time. Do activities that involve bending or lifting over and over again. Are very overweight (obese). What are the signs or symptoms? Symptoms can vary from mild to very bad. They may include: Any of these problems in the lower back, leg, hip, or butt: Mild tingling, loss of feeling, or dull aches. A burning feeling. Sharp pains. Loss of feeling in the back of the calf or the sole of the foot. Leg weakness. Very bad back pain that makes it hard to move. These symptoms may get worse when you cough, sneeze, or laugh. They may also get worse when you sit or stand for long periods of time. How is this treated? This condition often gets better without any treatment. However, treatment may include: Changing or cutting back on physical activity when you have pain. Exercising, including strengthening and stretching. Putting ice or heat on the affected area. Shots of medicines to relieve pain and  swelling or to relax your muscles. Surgery. Follow these instructions at home: Medicines Take over-the-counter and prescription medicines only as told by your doctor. Ask your doctor if you should avoid driving or using machines while you are taking your medicine. Managing pain     If told, put ice on the affected area. To do this: Put ice in a plastic bag. Place a towel between your skin and the bag. Leave the ice on for 20 minutes, 2-3 times a day. If your skin turns bright red, take off the ice right away to prevent skin damage. The risk of skin damage is higher if you cannot feel pain, heat, or cold. If told, put heat on the affected area. Do this as often as told by your doctor. Use the heat source that your doctor tells you to use, such as a moist heat pack or a heating pad. Place a towel between your skin and the heat source. Leave the heat on for 20-30 minutes. If your skin turns bright red, take off the heat right away to prevent burns. The risk of burns is higher if you cannot feel pain, heat, or cold. Activity  Return to your normal activities when your doctor says that it is safe. Avoid activities that make your symptoms worse. Take short rests during the day. When you rest for a long time, do some physical activity or stretching between periods of rest. Avoid sitting for a long time without moving. Get up and move around at least one time each   hour. Do exercises and stretches as told by your doctor. Do not lift anything that is heavier than 10 lb (4.5 kg). Avoid lifting heavy things even when you do not have symptoms. Avoid lifting heavy things over and over. When you lift objects, always lift in a way that is safe for your body. To do this, you should: Bend your knees. Keep the object close to your body. Avoid twisting. General instructions Stay at a healthy weight. Wear comfortable shoes that support your feet. Avoid wearing high heels. Avoid sleeping on a mattress  that is too soft or too hard. You might have less pain if you sleep on a mattress that is firm enough to support your back. Contact a doctor if: Your pain is not controlled by medicine. Your pain does not get better. Your pain gets worse. Your pain lasts longer than 4 weeks. You lose weight without trying. Get help right away if: You cannot control when you pee (urinate) or poop (have a bowel movement). You have weakness in any of these areas and it gets worse: Lower back. The area between your hip bones. Butt. Legs. You have redness or swelling of your back. You have a burning feeling when you pee. Summary Sciatica is pain, weakness, tingling, or loss of feeling (numbness) along the sciatic nerve. This may include the lower back, legs, hips, and butt. This condition happens when the sciatic nerve is pinched or has pressure put on it. Treatment often includes rest, exercise, medicines, and putting ice or heat on the affected area. This information is not intended to replace advice given to you by your health care provider. Make sure you discuss any questions you have with your health care provider. Document Revised: 05/07/2021 Document Reviewed: 05/07/2021 Elsevier Patient Education  2024 [employer]  

## 2022-12-26 NOTE — Progress Notes (Signed)
   Subjective:    Patient ID: Sylvia Taylor, female    DOB: 1970-05-19, 52 y.o.   MRN: [id number]   Chief Complaint: sciatic pain   HPI  Patient comes in c/o left posterior leg pain. Started 3 1/2 weeks ago. Has worsened. Aggravated by standing in one place. Rates pain 8.5-9/10. Has tried aleve and tylenol, biofreeze rub and lidocaine patches with no relief Patient Active Problem List   Diagnosis Date Noted   GAD (generalized anxiety disorder) 02/14/2022   Obstructive sleep apnea 04/20/2017   Severe obesity (BMI >= 40) (HCC) 06/06/2014   Fluid retention in legs 06/02/2013   Hypothyroidism 11/03/2012   Allergic rhinitis 11/03/2012       Review of Systems  Constitutional:  Negative for diaphoresis.  Eyes:  Negative for pain.  Respiratory:  Negative for shortness of breath.   Cardiovascular:  Negative for chest pain, palpitations and leg swelling.  Gastrointestinal:  Negative for abdominal pain.  Endocrine: Negative for polydipsia.  Skin:  Negative for rash.  Neurological:  Negative for dizziness, weakness and headaches.  Hematological:  Does not bruise/bleed easily.  All other systems reviewed and are negative.      Objective:   Physical Exam Constitutional:      Appearance: Normal appearance. She is obese.  Cardiovascular:     Rate and Rhythm: Normal rate and regular rhythm.     Heart sounds: Normal heart sounds.  Pulmonary:     Effort: Pulmonary effort is normal.     Breath sounds: Normal breath sounds.  Skin:    General: Skin is warm.  Neurological:     General: No focal deficit present.     Mental Status: She is alert and oriented to person, place, and time.  Psychiatric:        Mood and Affect: Mood normal.        Behavior: Behavior normal.    BP (!) 148/89   Pulse 72   Temp (!) 97.1 F (36.2 C) (Temporal)   Resp 20   Ht 5\' 5"  (1.651 m)   Wt (!) 401 lb (181.9 kg)   LMP 09/13/2015   SpO2 96%   BMI 66.73 kg/m         Assessment & Plan:    Sylvia Taylor in today with chief complaint of sciatic pain   1. Sciatica of left side Stretches RTO prn - methylPREDNISolone acetate (DEPO-MEDROL) injection 80 mg - ketorolac (TORADOL) injection 60 mg    The above assessment and management plan was discussed with the patient. The patient verbalized understanding of and has agreed to the management plan. Patient is aware to call the clinic if symptoms persist or worsen. Patient is aware when to return to the clinic for a follow-up visit. Patient educated on when it is appropriate to go to the emergency department.   [name], FNP

## 2023-03-01 ENCOUNTER — Telehealth: Payer: 59 | Admitting: Nurse Practitioner

## 2023-03-01 DIAGNOSIS — J329 Chronic sinusitis, unspecified: Secondary | ICD-10-CM | POA: Diagnosis not present

## 2023-03-01 DIAGNOSIS — B9689 Other specified bacterial agents as the cause of diseases classified elsewhere: Secondary | ICD-10-CM | POA: Diagnosis not present

## 2023-03-01 MED ORDER — DOXYCYCLINE HYCLATE 100 MG PO TABS
100.0000 mg | ORAL_TABLET | Freq: Two times a day (BID) | ORAL | 0 refills | Status: AC
Start: 2023-03-01 — End: 2023-03-11

## 2023-03-01 NOTE — Progress Notes (Signed)
I have spent 5 minutes in review of e-visit questionnaire, review and updating patient chart, medical decision making and response to patient.  ° °Jerrell Mangel W Secilia Apps, NP ° °  °

## 2023-03-01 NOTE — Progress Notes (Signed)

## 2023-03-17 ENCOUNTER — Ambulatory Visit: Payer: 59 | Admitting: Nurse Practitioner

## 2023-03-17 ENCOUNTER — Encounter: Payer: Self-pay | Admitting: Nurse Practitioner

## 2023-03-17 VITALS — BP 151/96 | HR 76 | Temp 97.7°F | Ht 65.0 in | Wt 398.0 lb

## 2023-03-17 DIAGNOSIS — M5432 Sciatica, left side: Secondary | ICD-10-CM | POA: Diagnosis not present

## 2023-03-17 MED ORDER — METHYLPREDNISOLONE ACETATE 80 MG/ML IJ SUSP
80.0000 mg | Freq: Once | INTRAMUSCULAR | Status: AC
Start: 2023-03-17 — End: 2023-03-17
  Administered 2023-03-17: 80 mg via INTRAMUSCULAR

## 2023-03-17 MED ORDER — KETOROLAC TROMETHAMINE 60 MG/2ML IM SOLN
60.0000 mg | Freq: Once | INTRAMUSCULAR | Status: AC
Start: 2023-03-17 — End: 2023-03-17
  Administered 2023-03-17: 60 mg via INTRAMUSCULAR

## 2023-03-17 NOTE — Patient Instructions (Signed)
 Sciatica  Sciatica is pain, weakness, tingling, or loss of feeling (numbness) along the sciatic nerve. The sciatic nerve starts in the lower back and goes down the back of each leg. Sciatica usually affects one side of the body. Sciatica usually goes away on its own or with treatment. Sometimes, sciatica may come back. What are the causes? This condition happens when the sciatic nerve is pinched or has pressure put on it. This may be caused by: A disk in between the bones of the spine bulging out too far (herniated disk). Changes in the spinal disks due to aging. A condition that affects a muscle in the butt. Extra bone growth near the sciatic nerve. A break (fracture) of the area between your hip bones (pelvis). Pregnancy. Tumor. This is rare. What increases the risk? You are more likely to develop this condition if you: Play sports that put pressure or stress on the spine. Have poor strength and ease of movement (flexibility). Have had a back injury or back surgery. Sit for long periods of time. Do activities that involve bending or lifting over and over again. Are very overweight (obese). What are the signs or symptoms? Symptoms can vary from mild to very bad. They may include: Any of these problems in the lower back, leg, hip, or butt: Mild tingling, loss of feeling, or dull aches. A burning feeling. Damiano pains. Loss of feeling in the back of the calf or the sole of the foot. Leg weakness. Very bad back pain that makes it hard to move. These symptoms may get worse when you cough, sneeze, or laugh. They may also get worse when you sit or stand for long periods of time. How is this treated? This condition often gets better without any treatment. However, treatment may include: Changing or cutting back on physical activity when you have pain. Exercising, including strengthening and stretching. Putting ice or heat on the affected area. Shots of medicines to relieve pain and  swelling or to relax your muscles. Surgery. Follow these instructions at home: Medicines Take over-the-counter and prescription medicines only as told by your doctor. Ask your doctor if you should avoid driving or using machines while you are taking your medicine. Managing pain     If told, put ice on the affected area. To do this: Put ice in a plastic bag. Place a towel between your skin and the bag. Leave the ice on for 20 minutes, 2-3 times a day. If your skin turns bright red, take off the ice right away to prevent skin damage. The risk of skin damage is higher if you cannot feel pain, heat, or cold. If told, put heat on the affected area. Do this as often as told by your doctor. Use the heat source that your doctor tells you to use, such as a moist heat pack or a heating pad. Place a towel between your skin and the heat source. Leave the heat on for 20-30 minutes. If your skin turns bright red, take off the heat right away to prevent burns. The risk of burns is higher if you cannot feel pain, heat, or cold. Activity  Return to your normal activities when your doctor says that it is safe. Avoid activities that make your symptoms worse. Take short rests during the day. When you rest for a long time, do some physical activity or stretching between periods of rest. Avoid sitting for a long time without moving. Get up and move around at least one time each  hour. Do exercises and stretches as told by your doctor. Do not lift anything that is heavier than 10 lb (4.5 kg). Avoid lifting heavy things even when you do not have symptoms. Avoid lifting heavy things over and over. When you lift objects, always lift in a way that is safe for your body. To do this, you should: Bend your knees. Keep the object close to your body. Avoid twisting. General instructions Stay at a healthy weight. Wear comfortable shoes that support your feet. Avoid wearing high heels. Avoid sleeping on a mattress  that is too soft or too hard. You might have less pain if you sleep on a mattress that is firm enough to support your back. Contact a doctor if: Your pain is not controlled by medicine. Your pain does not get better. Your pain gets worse. Your pain lasts longer than 4 weeks. You lose weight without trying. Get help right away if: You cannot control when you pee (urinate) or poop (have a bowel movement). You have weakness in any of these areas and it gets worse: Lower back. The area between your hip bones. Butt. Legs. You have redness or swelling of your back. You have a burning feeling when you pee. Summary Sciatica is pain, weakness, tingling, or loss of feeling (numbness) along the sciatic nerve. This may include the lower back, legs, hips, and butt. This condition happens when the sciatic nerve is pinched or has pressure put on it. Treatment often includes rest, exercise, medicines, and putting ice or heat on the affected area. This information is not intended to replace advice given to you by your health care provider. Make sure you discuss any questions you have with your health care provider. Document Revised: 05/07/2021 Document Reviewed: 05/07/2021 Elsevier Patient Education  2024 [employer].

## 2023-03-17 NOTE — Progress Notes (Signed)
   Subjective:    Patient ID: Sylvia Taylor, female    DOB: 04/22/1970, 53 y.o.   MRN: [id number]   Chief Complaint: back pain  Back Pain This is a new problem. The current episode started 1 to 4 weeks ago. The problem occurs constantly. The quality of the pain is described as shooting. The pain radiates to the left knee. The pain is severe. Stiffness is present All day. Pertinent negatives include no bowel incontinence, chest pain, dysuria, numbness, paresthesias, perianal numbness, tingling or weakness. She has tried NSAIDs and analgesics (lidocaine patches) for the symptoms. The treatment provided mild relief.    Patient Active Problem List   Diagnosis Date Noted   GAD (generalized anxiety disorder) 02/14/2022   Obstructive sleep apnea 04/20/2017   Severe obesity (BMI >= 40) (HCC) 06/06/2014   Fluid retention in legs 06/02/2013   Hypothyroidism 11/03/2012   Allergic rhinitis 11/03/2012       Review of Systems  Cardiovascular:  Negative for chest pain.  Gastrointestinal:  Negative for bowel incontinence.  Genitourinary:  Negative for dysuria.  Musculoskeletal:  Positive for back pain.  Neurological:  Negative for tingling, weakness, numbness and paresthesias.       Objective:   Physical Exam Vitals reviewed.  Constitutional:      Appearance: Normal appearance.  Cardiovascular:     Rate and Rhythm: Normal rate and regular rhythm.     Pulses: Normal pulses.     Heart sounds: Normal heart sounds.  Pulmonary:     Breath sounds: Normal breath sounds.  Skin:    General: Skin is warm.  Neurological:     General: No focal deficit present.     Mental Status: She is alert and oriented to person, place, and time.  Psychiatric:        Mood and Affect: Mood normal.        Behavior: Behavior normal.     BP (!) 151/96   Pulse 76   Temp 97.7 F (36.5 C) (Temporal)   Ht 5\' 5"  (1.651 m)   Wt (!) 398 lb (180.5 kg)   LMP 09/13/2015   SpO2 96%   BMI 66.23 kg/m         Assessment & Plan:   Sylvia Taylor in today with chief complaint of Back Pain (Stabbing pain in left buttocks/)   1. Sciatica of left side (Primary) Moist heat Rest  RTO prn - methylPREDNISolone acetate (DEPO-MEDROL) injection 80 mg - ketorolac (TORADOL) injection 60 mg    The above assessment and management plan was discussed with the patient. The patient verbalized understanding of and has agreed to the management plan. Patient is aware to call the clinic if symptoms persist or worsen. Patient is aware when to return to the clinic for a follow-up visit. Patient educated on when it is appropriate to go to the emergency department.   [name], FNP

## 2023-03-30 ENCOUNTER — Other Ambulatory Visit: Payer: Self-pay | Admitting: Nurse Practitioner

## 2023-03-30 DIAGNOSIS — F411 Generalized anxiety disorder: Secondary | ICD-10-CM

## 2023-04-21 ENCOUNTER — Emergency Department (HOSPITAL_BASED_OUTPATIENT_CLINIC_OR_DEPARTMENT_OTHER)

## 2023-04-21 ENCOUNTER — Emergency Department (HOSPITAL_BASED_OUTPATIENT_CLINIC_OR_DEPARTMENT_OTHER)
Admission: EM | Admit: 2023-04-21 | Discharge: 2023-04-22 | Disposition: A | Discharge status: Home / Self Care | Attending: Emergency Medicine | Admitting: Emergency Medicine

## 2023-04-21 ENCOUNTER — Other Ambulatory Visit: Payer: Self-pay

## 2023-04-21 ENCOUNTER — Encounter (HOSPITAL_BASED_OUTPATIENT_CLINIC_OR_DEPARTMENT_OTHER): Payer: Self-pay | Admitting: Emergency Medicine

## 2023-04-21 DIAGNOSIS — R110 Nausea: Secondary | ICD-10-CM | POA: Insufficient documentation

## 2023-04-21 DIAGNOSIS — R1013 Epigastric pain: Secondary | ICD-10-CM | POA: Insufficient documentation

## 2023-04-21 DIAGNOSIS — R1011 Right upper quadrant pain: Secondary | ICD-10-CM | POA: Insufficient documentation

## 2023-04-21 DIAGNOSIS — R1010 Upper abdominal pain, unspecified: Secondary | ICD-10-CM

## 2023-04-21 LAB — CBC
HCT: 41.5 % (ref 36.0–46.0)
Hemoglobin: 13.2 g/dL (ref 12.0–15.0)
MCH: 26.7 pg (ref 26.0–34.0)
MCHC: 31.8 g/dL (ref 30.0–36.0)
MCV: 84 fL (ref 80.0–100.0)
Platelets: 218 10*3/uL (ref 150–400)
RBC: 4.94 MIL/uL (ref 3.87–5.11)
RDW: 13.9 % (ref 11.5–15.5)
WBC: 9.3 10*3/uL (ref 4.0–10.5)
nRBC: 0 % (ref 0.0–0.2)

## 2023-04-21 LAB — COMPREHENSIVE METABOLIC PANEL
ALT: 35 U/L (ref 0–44)
AST: 19 U/L (ref 15–41)
Albumin: 4.5 g/dL (ref 3.5–5.0)
Alkaline Phosphatase: 97 U/L (ref 38–126)
Anion gap: 8 (ref 5–15)
BUN: 11 mg/dL (ref 6–20)
CO2: 29 mmol/L (ref 22–32)
Calcium: 9.9 mg/dL (ref 8.9–10.3)
Chloride: 97 mmol/L — ABNORMAL LOW (ref 98–111)
Creatinine, Ser: 0.68 mg/dL (ref 0.44–1.00)
GFR, Estimated: >60 mL/min (ref >60)
Glucose, Bld: 95 mg/dL (ref 70–99)
Potassium: 4.1 mmol/L (ref 3.5–5.1)
Sodium: 134 mmol/L — ABNORMAL LOW (ref 135–145)
Total Bilirubin: 0.5 mg/dL (ref 0.0–1.2)
Total Protein: 7.9 g/dL (ref 6.5–8.1)

## 2023-04-21 LAB — LIPASE, BLOOD: Lipase: 10 U/L — ABNORMAL LOW (ref 11–51)

## 2023-04-21 LAB — URINALYSIS, ROUTINE W REFLEX MICROSCOPIC
Bilirubin Urine: NEGATIVE
Glucose, UA: NEGATIVE mg/dL
Hgb urine dipstick: NEGATIVE
Ketones, ur: NEGATIVE mg/dL
Leukocytes,Ua: NEGATIVE
Nitrite: NEGATIVE
Protein, ur: NEGATIVE mg/dL
Specific Gravity, Urine: 1.006 (ref 1.005–1.030)
pH: 6.5 (ref 5.0–8.0)

## 2023-04-21 MED ORDER — ONDANSETRON 8 MG PO TBDP: 8.0000 mg | ORAL_TABLET | Freq: Three times a day (TID) | ORAL | 0 refills | Status: DC | PRN

## 2023-04-21 MED ORDER — IOHEXOL 300 MG/ML  SOLN
100.0000 mL | Freq: Once | INTRAMUSCULAR | Status: AC | PRN
  Administered 2023-04-21: 100 mL via INTRAVENOUS

## 2023-04-21 MED ORDER — SUCRALFATE 1 G PO TABS
1.0000 g | ORAL_TABLET | Freq: Once | ORAL | Status: AC
  Administered 2023-04-21: 1 g via ORAL
  Filled 2023-04-21: qty 1

## 2023-04-21 MED ORDER — SUCRALFATE 1 G PO TABS: 1.0000 g | ORAL_TABLET | Freq: Three times a day (TID) | ORAL | 0 refills | Status: DC

## 2023-04-21 NOTE — ED Triage Notes (Signed)
 Patient c/o RUQ pain x 4 days.  Patient does not have a gallbladder.  Patient also endorses nausea.

## 2023-04-21 NOTE — ED Notes (Signed)
 Patient to CT.

## 2023-04-21 NOTE — ED Provider Notes (Incomplete)
  Physical Exam  BP 124/72   Pulse 75   Temp 98 F (36.7 C)   Resp 18   Wt (!) 180.5 kg   LMP 09/13/2015   SpO2 95%   BMI 66.23 kg/m   Physical Exam  Procedures  Procedures  ED Course / MDM    Medical Decision Making Amount and/or Complexity of Data Reviewed Labs: ordered. Radiology: ordered.  Risk Prescription drug management.   77F presenting with epigastric and RUQ pain. Waiting on CT for DC.     CT Abd Pelvis: IMPRESSION:  1. No acute localizing process in the abdomen or pelvis.  2. Hepatosplenomegaly with fatty infiltration of the liver.  3. Nonobstructing left renal calculus.  4. Rectus diastasis and bulging of the anterior abdominal wall.  There is some subcutaneous superficial stranding and haziness just  inferior to the umbilicus which may be related to cellulitis. No  fluid collection identified.    Patient vitally stable, not meeting SIRS criteria.  CT with questionable abdominal wall cellulitis however clinically the patient is not tender in this region and has no clinical evidence of cellulitis.  Suspect likely gastritis or peptic ulcer disease.  Patient is already on omeprazole outpatient.  Will initiate Pepcid in addition to the patient's previously prescribed Zofran and Carafate.  Outpatient referral to gastroenterology was placed.      Ernie Avena, MD 04/22/23 [phone]

## 2023-04-21 NOTE — Discharge Instructions (Addendum)
 We saw you in the ER for abdominal discomfort. The results of our workup, including labs and imaging are reassuring at this time.  Questioning possible gastritis given some burning component to the pain.  Symptoms can evolve, therefore please return to the ER if you have increased pain, fevers, chills, inability to keep any medications down, confusion, sweating. Otherwise see your primary care doctor in 2-3 days for further evaluation.

## 2023-04-21 NOTE — ED Provider Notes (Signed)
 [place] [facility]ERGENCY DEPARTMENT AT [facility] Provider Note   CSN: [id number] Arrival date & time: 04/21/23  1753     History {Add pertinent medical, surgical, social history, OB history to HPI:1} Chief Complaint  Patient presents with   Abdominal Pain    Sylvia Taylor is a 53 y.o. female.  HPI    53 year old female comes in with chief complaint of abdominal pain.  Abdominal pain started about 4 days ago.  Pain is primarily in the right upper quadrant and epigastric region.  Pain is described as intermittent initially, now constant and there is some burning component to the pain.  Positive nausea, no diarrhea.  Patient has previous history of cholecystectomy. Home Medications Prior to Admission medications   Medication Sig Start Date End Date Taking? Authorizing Provider  sucralfate (CARAFATE) 1 g tablet Take 1 tablet (1 g total) by mouth 4 (four) times daily -  with meals and at bedtime. 04/21/23  Yes Derwood Kaplan, MD  cetirizine (ZYRTEC) 10 MG tablet Take 10 mg by mouth daily as needed for allergies.    [provider]  escitalopram (LEXAPRO) 10 MG tablet TAKE 1 TABLET BY MOUTH EVERY DAY 03/31/23   Daphine Deutscher, Mary-Margaret, FNP  fluticasone (FLONASE) 50 MCG/ACT nasal spray Place 1 spray into both nostrils as needed for allergies or rhinitis.    [provider]  levothyroxine (SYNTHROID) 75 MCG tablet Take 1 tablet (75 mcg total) by mouth daily. 08/13/22   Daphine Deutscher, Mary-Margaret, FNP  omeprazole (PRILOSEC) 40 MG capsule Take 1 capsule (40 mg total) by mouth every morning. 11/12/22   Daphine Deutscher Mary-Margaret, FNP  Probiotic Product (ALOE 82956 & PROBIOTICS PO) Probiotics    [provider]      Allergies    Amoxicillin    Review of Systems   Review of Systems  Physical Exam Updated Vital Signs BP 124/72   Pulse 75   Temp 98 F (36.7 C)   Resp 18   Wt (!) 180.5 kg   LMP 09/13/2015   SpO2 95%   BMI 66.23 kg/m  Physical Exam  ED Results /  Procedures / Treatments   Labs (all labs ordered are listed, but only abnormal results are displayed) Labs Reviewed  LIPASE, BLOOD - Abnormal; Notable for the following components:      Result Value   Lipase 10 (*)    All other components within normal limits  COMPREHENSIVE METABOLIC PANEL - Abnormal; Notable for the following components:   Sodium 134 (*)    Chloride 97 (*)    All other components within normal limits  URINALYSIS, ROUTINE W REFLEX MICROSCOPIC - Abnormal; Notable for the following components:   Color, Urine COLORLESS (*)    All other components within normal limits  CBC    EKG None  Radiology No results found.  Procedures Procedures  {Document cardiac monitor, telemetry assessment procedure when appropriate:1}  Medications Ordered in ED Medications  sucralfate (CARAFATE) tablet 1 g (1 g Oral Given 04/21/23 2226)  iohexol (OMNIPAQUE) 300 MG/ML solution 100 mL (100 mLs Intravenous Contrast Given 04/21/23 2205)    ED Course/ Medical Decision Making/ A&P   {   Click here for ABCD2, HEART and other calculatorsREFRESH Note before signing :1}                              Medical Decision Making Amount and/or Complexity of Data Reviewed Labs: ordered. Radiology: ordered.  Risk  Prescription drug management.   ***  {Document critical care time when appropriate:1} {Document review of labs and clinical decision tools ie heart score, Chads2Vasc2 etc:1}  {Document your independent review of radiology images, and any outside records:1} {Document your discussion with family members, caretakers, and with consultants:1} {Document social determinants of health affecting pt's care:1} {Document your decision making why or why not admission, treatments were needed:1} Final Clinical Impression(s) / ED Diagnoses Final diagnoses:  Pain of upper abdomen    Rx / DC Orders ED Discharge Orders          Ordered    sucralfate (CARAFATE) 1 g tablet  3 times daily with  meals & bedtime        04/21/23 2326

## 2023-04-22 MED ORDER — DOXYCYCLINE HYCLATE 100 MG PO CAPS: 100.0000 mg | ORAL_CAPSULE | Freq: Two times a day (BID) | ORAL | 0 refills | Status: DC

## 2023-04-22 MED ORDER — FAMOTIDINE 40 MG PO TABS: 40.0000 mg | ORAL_TABLET | Freq: Every day | ORAL | 0 refills | Status: DC

## 2023-04-30 ENCOUNTER — Other Ambulatory Visit: Payer: Self-pay | Admitting: Nurse Practitioner

## 2023-04-30 DIAGNOSIS — E034 Atrophy of thyroid (acquired): Secondary | ICD-10-CM

## 2023-05-11 ENCOUNTER — Other Ambulatory Visit: Payer: Self-pay | Admitting: Nurse Practitioner

## 2023-05-30 ENCOUNTER — Telehealth: Admitting: Nurse Practitioner

## 2023-05-30 DIAGNOSIS — J0140 Acute pansinusitis, unspecified: Secondary | ICD-10-CM | POA: Diagnosis not present

## 2023-05-30 MED ORDER — DOXYCYCLINE HYCLATE 100 MG PO TABS
100.0000 mg | ORAL_TABLET | Freq: Two times a day (BID) | ORAL | 0 refills | Status: AC
Start: 2023-05-30 — End: 2023-06-06

## 2023-05-30 NOTE — Progress Notes (Signed)

## 2023-06-24 DIAGNOSIS — M7052 Other bursitis of knee, left knee: Secondary | ICD-10-CM | POA: Insufficient documentation

## 2023-06-24 DIAGNOSIS — M170 Bilateral primary osteoarthritis of knee: Secondary | ICD-10-CM | POA: Insufficient documentation

## 2023-07-10 ENCOUNTER — Ambulatory Visit
Admission: EM | Admit: 2023-07-10 | Discharge: 2023-07-10 | Disposition: A | Discharge status: Home / Self Care | Attending: Nurse Practitioner | Admitting: Nurse Practitioner

## 2023-07-10 DIAGNOSIS — L02213 Cutaneous abscess of chest wall: Secondary | ICD-10-CM | POA: Diagnosis not present

## 2023-07-10 MED ORDER — DOXYCYCLINE HYCLATE 100 MG PO TABS: 100.0000 mg | ORAL_TABLET | Freq: Two times a day (BID) | ORAL | 0 refills | Status: AC

## 2023-07-10 MED ORDER — CHLORHEXIDINE GLUCONATE 4 % EX SOLN: CUTANEOUS | 0 refills | Status: DC

## 2023-07-10 NOTE — ED Triage Notes (Signed)
 Pt reports a lump under the left arm near the bra line, the lump is discolored causing pain, has gotten larger over the course of 2 weeks. Pt hs tried warm compresses

## 2023-07-10 NOTE — Discharge Instructions (Signed)
 Take medication as prescribed. Warm compresses to the affected area 3-4 times daily. Clean the area at least twice daily with the antibacterial solution. Keep the area covered while it is draining. Follow-up in this clinic if you continue to experience pain is, increased swelling, or symptoms fail to improve. Go to the emergency department if you develop fever, chills, generalized fatigue, nausea, vomiting, or if the area of redness spreads into the back, chest, or other concerns.   Follow-up as needed.

## 2023-07-10 NOTE — ED Provider Notes (Signed)
 [facility]    CSN: [id number] Arrival date & time: 07/10/23  1604      History   Chief Complaint No chief complaint on file.   HPI Sylvia Taylor is a 53 y.o. female.   The history is provided by the patient.   Patient presents with pain and swelling under the bra line on her left side has been present for the past 2 weeks.  Patient states over the past several days, the area has become enlarged and is causing more pain.  She states that she has been applying warm compresses to the area without minimal relief of her symptoms.  She denies fever, chills, chest pain, abdominal pain, nausea, vomiting, or foul-smelling drainage from the site.  Patient also denies prior history of diabetes.  Past Medical History:  Diagnosis Date   Allergy    GERD (gastroesophageal reflux disease)    Hypothyroidism    PONV (postoperative nausea and vomiting)     Patient Active Problem List   Diagnosis Date Noted   GAD (generalized anxiety disorder) 02/14/2022   Obstructive sleep apnea 04/20/2017   Severe obesity (BMI >= 40) (HCC) 06/06/2014   Fluid retention in legs 06/02/2013   Hypothyroidism 11/03/2012   Allergic rhinitis 11/03/2012    Past Surgical History:  Procedure Laterality Date   BIOPSY  10/27/2020   Procedure: BIOPSY;  Surgeon: Alvis Jourdain, MD;  Location: [facility];  Service: Endoscopy;;   BREAST BIOPSY Left 05/27/2013   CESAREAN SECTION     CHOLECYSTECTOMY N/A 05/11/2020   Procedure: LAPAROSCOPIC CHOLECYSTECTOMY;  Surgeon: Oza Blumenthal, MD;  Location: [facility] OR;  Service: General;  Laterality: N/A;   COLONOSCOPY WITH PROPOFOL  N/A 03/24/2020   Procedure: COLONOSCOPY WITH PROPOFOL ;  Surgeon: Alvis Jourdain, MD;  Location: [facility];  Service: Endoscopy;  Laterality: N/A;   ESOPHAGOGASTRODUODENOSCOPY (EGD) WITH PROPOFOL  N/A 10/27/2020   Procedure: ESOPHAGOGASTRODUODENOSCOPY (EGD) WITH PROPOFOL ;  Surgeon: Alvis Jourdain, MD;  Location: [facility];  Service:  Endoscopy;  Laterality: N/A;   KNEE ARTHROSCOPY Right 09/2014   LIVER BIOPSY N/A 05/11/2020   Procedure: LIVER BIOPSY;  Surgeon: Oza Blumenthal, MD;  Location: [facility] OR;  Service: General;  Laterality: N/A;   POLYPECTOMY  03/24/2020   Procedure: POLYPECTOMY;  Surgeon: Alvis Jourdain, MD;  Location: [facility];  Service: Endoscopy;;    OB History   No obstetric history on file.      Home Medications    Prior to Admission medications   Medication Sig Start Date End Date Taking? Authorizing Provider  cetirizine  (ZYRTEC ) 10 MG tablet Take 10 mg by mouth daily as needed for allergies.    [provider]  escitalopram  (LEXAPRO ) 10 MG tablet TAKE 1 TABLET BY MOUTH EVERY DAY 03/31/23   Gaylyn Keas, Mary-Margaret, FNP  famotidine  (PEPCID ) 40 MG tablet Take 1 tablet (40 mg total) by mouth daily for 14 days. 04/22/23 05/06/23  Rosealee Concha, MD  fluticasone  (FLONASE ) 50 MCG/ACT nasal spray Place 1 spray into both nostrils as needed for allergies or rhinitis.    [provider]  levothyroxine  (UNITHROID ) 75 MCG tablet Please specify directions, refills and quantity 04/30/23   Gaylyn Keas, Mary-Margaret, FNP  omeprazole  (PRILOSEC) 40 MG capsule TAKE 1 CAPSULE (40 MG TOTAL) BY MOUTH EVERY MORNING. 05/12/23   Gaylyn Keas, Mary-Margaret, FNP  ondansetron  (ZOFRAN -ODT) 8 MG disintegrating tablet Take 1 tablet (8 mg total) by mouth every 8 (eight) hours as needed for nausea. 04/21/23   Deatra Face, MD  Probiotic Product (ALOE 10000 & PROBIOTICS PO)  Probiotics    [provider]  sucralfate  (CARAFATE ) 1 g tablet Take 1 tablet (1 g total) by mouth 4 (four) times daily -  with meals and at bedtime. 04/21/23   Deatra Face, MD    Family History Family History  Problem Relation Age of Onset   Hypertension Father    Sleep apnea Father    Breast cancer Maternal Grandmother        60s   Cancer Maternal Grandmother        ovarian and thyroid  cancer    Social History Social History    Tobacco Use   Smoking status: Never   Smokeless tobacco: Never  Vaping Use   Vaping status: Never Used  Substance Use Topics   Alcohol use: No   Drug use: No     Allergies   Amoxicillin    Review of Systems Review of Systems Per HPI  Physical Exam Triage Vital Signs ED Triage Vitals  Encounter Vitals Group     BP 07/10/23 1632 (!) 150/104     Systolic BP Percentile --      Diastolic BP Percentile --      Pulse Rate 07/10/23 1632 76     Resp 07/10/23 1632 20     Temp 07/10/23 1632 98.6 F (37 C)     Temp Source 07/10/23 1632 Oral     SpO2 07/10/23 1632 95 %     Weight --      Height --      Head Circumference --      Peak Flow --      Pain Score 07/10/23 1636 8     Pain Loc --      Pain Education --      Exclude from Growth Chart --    No data found.  Updated Vital Signs BP (!) 150/104 (BP Location: Right Arm)   Pulse 76   Temp 98.6 F (37 C) (Oral)   Resp 20   LMP 09/13/2015   SpO2 95%   Visual Acuity Right Eye Distance:   Left Eye Distance:   Bilateral Distance:    Right Eye Near:   Left Eye Near:    Bilateral Near:     Physical Exam Vitals and nursing note reviewed.  Constitutional:      General: She is not in acute distress.    Appearance: Normal appearance.  HENT:     Head: Normocephalic.  Eyes:     Extraocular Movements: Extraocular movements intact.     Pupils: Pupils are equal, round, and reactive to light.  Cardiovascular:     Rate and Rhythm: Normal rate and regular rhythm.     Pulses: Normal pulses.     Heart sounds: Normal heart sounds.  Pulmonary:     Effort: Pulmonary effort is normal.     Breath sounds: Normal breath sounds.  Chest:     Chest wall: Swelling and tenderness present. No mass or edema.    Musculoskeletal:     Cervical back: Normal range of motion.  Skin:    General: Skin is warm and dry.  Neurological:     General: No focal deficit present.     Mental Status: She is alert and oriented to person,  place, and time.  Psychiatric:        Mood and Affect: Mood normal.        Behavior: Behavior normal.      UC Treatments / Results  Labs (all labs ordered are listed,  but only abnormal results are displayed) Labs Reviewed - No data to display  EKG   Radiology No results found.  Procedures Incision and Drainage  Date/Time: 07/10/2023 5:21 PM  Performed by: Hardy Lia, NP Authorized by: Hardy Lia, NP   Consent:    Consent obtained:  Verbal   Consent given by:  Patient   Risks discussed:  Bleeding, incomplete drainage and pain   Alternatives discussed:  No treatment and alternative treatment Universal protocol:    Procedure explained and questions answered to patient or proxy's satisfaction: yes     Patient identity confirmed:  Verbally with patient Location:    Type:  Abscess   Size:  7 cm   Location: left chest wall. Pre-procedure details:    Procedure prep: Normal Saline and hibiclens  soap. Sedation:    Sedation type:  None Anesthesia:    Anesthesia method:  Local infiltration   Local anesthetic:  Lidocaine  2% w/o epi (5mL) Procedure details:    Ultrasound guidance: no     Needle aspiration: no     Incision types:  Stab incision (#11 scapel)   Drainage:  Purulent and serosanguinous   Drainage amount:  Moderate   Wound treatment:  Wound left open   Packing materials:  None Post-procedure details:    Procedure completion:  Tolerated Comments:     Approximate 7 cm abscess noted to the left chest wall at the bra line.  Site was cleansed with Hibiclens  soap and normal saline.  Local anesthesia with lidocaine  2% without epi, 5 mL used.  #11 scalpel used for stab incision.  Moderate purulent, serosanguineous drainage present.  Patient tolerated the procedure well.  Postprocedure, site cleansed, dressing was applied.  (including critical care time)  Medications Ordered in UC Medications - No data to display  Initial Impression /  Assessment and Plan / UC Course  I have reviewed the triage vital signs and the nursing notes.  Pertinent labs & imaging results that were available during my care of the patient were reviewed by me and considered in my medical decision making (see chart for details).  Patient with abscess noted to the left chest wall.  I&D performed with moderate return of purulent and serosanguineous drainage.  Will start patient on doxycycline  100 mg twice daily for the next 7 days.  Hibiclens  4% external solution prescribed to cleanse the affected area. Supportive care recommendations provided and discussed with the patient to include over-the-counter analgesics, apply warm compresses to the affected area, and to monitor for worsening symptoms.  Patient was given strict follow-up precautions.  Patient was in agreement with this plan of care and verbalizes understanding.  All questions were answered.  Patient stable for discharge.  Work note was provided.   Final Clinical Impressions(s) / UC Diagnoses   Final diagnoses:  None   Discharge Instructions   None    ED Prescriptions   None    PDMP not reviewed this encounter.   Hardy Lia, NP 07/10/23 1732

## 2023-07-21 ENCOUNTER — Ambulatory Visit: Admitting: Nurse Practitioner

## 2023-07-21 ENCOUNTER — Encounter: Payer: Self-pay | Admitting: Nurse Practitioner

## 2023-07-21 VITALS — BP 142/89 | HR 95 | Temp 97.2°F | Ht 65.0 in | Wt 398.0 lb

## 2023-07-21 DIAGNOSIS — Z0000 Encounter for general adult medical examination without abnormal findings: Secondary | ICD-10-CM

## 2023-07-21 DIAGNOSIS — G4733 Obstructive sleep apnea (adult) (pediatric): Secondary | ICD-10-CM

## 2023-07-21 DIAGNOSIS — E034 Atrophy of thyroid (acquired): Secondary | ICD-10-CM

## 2023-07-21 DIAGNOSIS — L02231 Carbuncle of abdominal wall: Secondary | ICD-10-CM

## 2023-07-21 DIAGNOSIS — Z0001 Encounter for general adult medical examination with abnormal findings: Secondary | ICD-10-CM

## 2023-07-21 DIAGNOSIS — F411 Generalized anxiety disorder: Secondary | ICD-10-CM | POA: Diagnosis not present

## 2023-07-21 DIAGNOSIS — R600 Localized edema: Secondary | ICD-10-CM | POA: Diagnosis not present

## 2023-07-21 DIAGNOSIS — Z6841 Body Mass Index (BMI) 40.0 and over, adult: Secondary | ICD-10-CM

## 2023-07-21 MED ORDER — OMEPRAZOLE 40 MG PO CPDR: 40.0000 mg | DELAYED_RELEASE_CAPSULE | Freq: Every morning | ORAL | 0 refills | Status: DC

## 2023-07-21 MED ORDER — SULFAMETHOXAZOLE-TRIMETHOPRIM 800-160 MG PO TABS: 1.0000 | ORAL_TABLET | Freq: Two times a day (BID) | ORAL | 0 refills | Status: DC

## 2023-07-21 NOTE — Patient Instructions (Signed)

## 2023-07-21 NOTE — Progress Notes (Signed)
 Subjective:    Patient ID: Sylvia Taylor, female    DOB: 09/16/1970, 53 y.o.   MRN: [id number]   Chief Complaint: annual physical   HPI:  Sylvia Taylor is a 53 y.o. who identifies as a female who was assigned female at birth.   Social history: Lives with: husband and children Work history: Engineer, site   Comes in today for follow up of the following chronic medical issues:  1. Hypothyroidism due to acquired atrophy of thyroid  No issues that she is aware of. Lab Results  Component Value Date   TSH 2.180 08/13/2022     2. Fluid retention in legs Has daily by the end of the day. Denies SOB  3. GAD (generalized anxiety disorder) Is on lexapro  and is doing well    07/21/2023    8:49 AM 12/26/2022    3:21 PM 09/17/2022    9:33 AM 08/13/2022   10:37 AM  GAD 7 : Generalized Anxiety Score  Nervous, Anxious, on Edge 0 0 0 0  Control/stop worrying 0 0 0 0  Worry too much - different things 0 0 0 0  Trouble relaxing 0 0 0 0  Restless 0 0 0 0  Easily annoyed or irritable 0 0 0 0  Afraid - awful might happen 0 0 0 0  Total GAD 7 Score 0 0 0 0  Anxiety Difficulty Not difficult at all Not difficult at all Not difficult at all Not difficult at all        4. Obstructive sleep apnea Doe snot have cpap- sleeps well. Eels rested in mornings  5. Severe obesity (BMI >= 40) (HCC) No recent weight changes  Wt Readings from Last 3 Encounters:  07/21/23 (!) 398 lb (180.5 kg)  04/21/23 (!) 398 lb (180.5 kg)  03/17/23 (!) 398 lb (180.5 kg)   BMI Readings from Last 3 Encounters:  07/21/23 66.23 kg/m  04/21/23 66.23 kg/m  03/17/23 66.23 kg/m       New complaints: Draining abscess left flank. Has already completed a round of doxycycline . Area is still draining and sore to the touch.   Allergies  Allergen Reactions   Amoxicillin  Hives   Outpatient Encounter Medications as of 07/21/2023  Medication Sig   cetirizine  (ZYRTEC ) 10 MG tablet Take 10 mg by mouth daily as  needed for allergies.   chlorhexidine  (HIBICLENS ) 4 % external liquid Apply a small amount of solution into warm water , cleanse the affected area twice daily until symptoms improve.   escitalopram  (LEXAPRO ) 10 MG tablet TAKE 1 TABLET BY MOUTH EVERY DAY   famotidine  (PEPCID ) 40 MG tablet Take 1 tablet (40 mg total) by mouth daily for 14 days.   fluticasone  (FLONASE ) 50 MCG/ACT nasal spray Place 1 spray into both nostrils as needed for allergies or rhinitis.   levothyroxine  (UNITHROID ) 75 MCG tablet Please specify directions, refills and quantity   omeprazole  (PRILOSEC) 40 MG capsule TAKE 1 CAPSULE (40 MG TOTAL) BY MOUTH EVERY MORNING.   ondansetron  (ZOFRAN -ODT) 8 MG disintegrating tablet Take 1 tablet (8 mg total) by mouth every 8 (eight) hours as needed for nausea.   Probiotic Product (ALOE 10000 & PROBIOTICS PO) Probiotics   sucralfate  (CARAFATE ) 1 g tablet Take 1 tablet (1 g total) by mouth 4 (four) times daily -  with meals and at bedtime.   No facility-administered encounter medications on file as of 07/21/2023.    Past Surgical History:  Procedure Laterality Date   BIOPSY  10/27/2020  Procedure: BIOPSY;  Surgeon: Alvis Jourdain, MD;  Location: [facility];  Service: Endoscopy;;   BREAST BIOPSY Left 05/27/2013   CESAREAN SECTION     CHOLECYSTECTOMY N/A 05/11/2020   Procedure: LAPAROSCOPIC CHOLECYSTECTOMY;  Surgeon: Oza Blumenthal, MD;  Location: [facility] OR;  Service: General;  Laterality: N/A;   COLONOSCOPY WITH PROPOFOL  N/A 03/24/2020   Procedure: COLONOSCOPY WITH PROPOFOL ;  Surgeon: Alvis Jourdain, MD;  Location: [facility];  Service: Endoscopy;  Laterality: N/A;   ESOPHAGOGASTRODUODENOSCOPY (EGD) WITH PROPOFOL  N/A 10/27/2020   Procedure: ESOPHAGOGASTRODUODENOSCOPY (EGD) WITH PROPOFOL ;  Surgeon: Alvis Jourdain, MD;  Location: [facility];  Service: Endoscopy;  Laterality: N/A;   KNEE ARTHROSCOPY Right 09/2014   LIVER BIOPSY N/A 05/11/2020   Procedure: LIVER BIOPSY;  Surgeon: Oza Blumenthal, MD;  Location: [facility] OR;  Service: General;  Laterality: N/A;   POLYPECTOMY  03/24/2020   Procedure: POLYPECTOMY;  Surgeon: Alvis Jourdain, MD;  Location: [facility];  Service: Endoscopy;;    Family History  Problem Relation Age of Onset   Hypertension Father    Sleep apnea Father    Breast cancer Maternal Grandmother        42s   Cancer Maternal Grandmother        ovarian and thyroid  cancer      Controlled substance contract: n/a     Review of Systems  Constitutional:  Negative for diaphoresis.  Eyes:  Negative for photophobia, pain and visual disturbance.  Respiratory:  Negative for shortness of breath.   Cardiovascular:  Negative for chest pain, palpitations and leg swelling.  Gastrointestinal:  Negative for abdominal pain.  Endocrine: Negative for polydipsia.  Skin:  Negative for rash.  Neurological:  Negative for dizziness, weakness and headaches.  Hematological:  Does not bruise/bleed easily.  All other systems reviewed and are negative.      Objective:   Physical Exam Vitals and nursing note reviewed.  Constitutional:      General: She is not in acute distress.    Appearance: Normal appearance. She is well-developed.  HENT:     Head: Normocephalic.     Right Ear: Tympanic membrane normal.     Left Ear: Tympanic membrane normal.     Nose: Nose normal.     Mouth/Throat:     Mouth: Mucous membranes are moist.  Eyes:     Pupils: Pupils are equal, round, and reactive to light.  Neck:     Vascular: No carotid bruit or JVD.  Cardiovascular:     Rate and Rhythm: Normal rate and regular rhythm.     Heart sounds: Normal heart sounds.  Pulmonary:     Effort: Pulmonary effort is normal. No respiratory distress.     Breath sounds: Normal breath sounds. No wheezing or rales.  Chest:     Chest wall: No tenderness.  Abdominal:     General: Bowel sounds are normal. There is no distension or abdominal bruit.     Palpations: Abdomen is soft. There is no  hepatomegaly, splenomegaly, mass or pulsatile mass.     Tenderness: There is no abdominal tenderness.  Musculoskeletal:        General: Normal range of motion.     Cervical back: Normal range of motion and neck supple.  Lymphadenopathy:     Cervical: No cervical adenopathy.  Skin:    General: Skin is warm and dry.     Comments: 3cm erythematous indurated tender draining area left upper flank.   Neurological:     Mental Status: She is  alert and oriented to person, place, and time.     Deep Tendon Reflexes: Reflexes are normal and symmetric.  Psychiatric:        Behavior: Behavior normal.        Thought Content: Thought content normal.        Judgment: Judgment normal.    LMP 09/13/2015   BP (!) 142/89   Pulse 95   Temp (!) 97.2 F (36.2 C) (Temporal)   Ht 5\' 5"  (1.651 m)   Wt (!) 398 lb (180.5 kg)   LMP 09/13/2015   SpO2 92%   BMI 66.23 kg/m        Assessment & Plan:   HUDA PETREY comes in today with chief complaint of annual physical   Diagnosis and orders addressed:  1. Hypothyroidism due to acquired atrophy of thyroid  Labs pending - Thyroid  Panel With TSH - levothyroxine  (SYNTHROID ) 75 MCG tablet; Take 1 tablet (75 mcg total) by mouth daily.  Dispense: 90 tablet; Refill: 1  2. Fluid retention in legs Elevate legs when sitting  3. GAD (generalized anxiety disorder) Stress management - escitalopram  (LEXAPRO ) 10 MG tablet; Take 1 tablet (10 mg total) by mouth daily.  Dispense: 90 tablet; Refill: 1  4. Severe obesity (BMI >= 40) (HCC) Discussed diet and exercise for person with BMI >25 Will recheck weight in 3-6 months   5. Migraine with aura and without status migrainosus, not intractable Rest Cool compresses - ketorolac  (TORADOL ) injection 60 mg  6. carbuncle Continue warm soaks Start on bactrim RTO prn  Labs pending Health Maintenance reviewed Diet and exercise encouraged  Follow up plan:  Mary-Margaret Gaylyn Keas, FNP

## 2023-07-31 ENCOUNTER — Other Ambulatory Visit: Payer: Self-pay | Admitting: Nurse Practitioner

## 2023-07-31 DIAGNOSIS — E034 Atrophy of thyroid (acquired): Secondary | ICD-10-CM

## 2023-08-14 ENCOUNTER — Encounter: Payer: BC Managed Care – PPO | Admitting: Nurse Practitioner

## 2023-08-18 ENCOUNTER — Other Ambulatory Visit: Payer: Self-pay | Admitting: Nurse Practitioner

## 2023-08-18 DIAGNOSIS — Z1231 Encounter for screening mammogram for malignant neoplasm of breast: Secondary | ICD-10-CM

## 2023-08-25 ENCOUNTER — Ambulatory Visit: Admitting: Orthopaedic Surgery

## 2023-08-25 ENCOUNTER — Other Ambulatory Visit (INDEPENDENT_AMBULATORY_CARE_PROVIDER_SITE_OTHER)

## 2023-08-25 VITALS — Ht 65.0 in | Wt >= 6400 oz

## 2023-08-25 DIAGNOSIS — M1712 Unilateral primary osteoarthritis, left knee: Secondary | ICD-10-CM

## 2023-08-25 DIAGNOSIS — M25562 Pain in left knee: Secondary | ICD-10-CM

## 2023-08-25 DIAGNOSIS — Z6841 Body Mass Index (BMI) 40.0 and over, adult: Secondary | ICD-10-CM

## 2023-08-25 DIAGNOSIS — G8929 Other chronic pain: Secondary | ICD-10-CM | POA: Diagnosis not present

## 2023-08-25 MED ORDER — METHYLPREDNISOLONE ACETATE 40 MG/ML IJ SUSP
40.0000 mg | INTRAMUSCULAR | Status: AC | PRN
  Administered 2023-08-25: 40 mg via INTRA_ARTICULAR

## 2023-08-25 MED ORDER — LIDOCAINE HCL 1 % IJ SOLN
3.0000 mL | INTRAMUSCULAR | Status: AC | PRN
  Administered 2023-08-25: 3 mL

## 2023-08-25 NOTE — Progress Notes (Signed)
 The patient is a very pleasant 54 year old female that I am seeing for the first time.  She is sent to us  for evaluation treatment of left knee pain.  She does have a remote history of a right knee arthroscopy and a partial medial meniscectomy.  Her left knee is hurting her quite a bit and in May she went to urgent care and they did place an injection along the pes bursa of her knee but not in the knee joint itself on the left side.  She is not a diabetic.  Her main comorbidity is her obesity.  Her BMI today is 66.9.  She reports that the pain is mainly in the back of her knee and along the medial aspect of her knee.  It does hurt to weight-bear.  Examination of her left knee shows varus malalignment that is correctable.  There is no significant effusion but there is pain along the medial joint line and patellofemoral pain with range of motion the knee and crepitation.  2 views of the left knee show tricompartment arthritis mainly involving the medial and patellofemoral joints.  There are small osteophytes in all 3 compartments.  We did talk about weight loss and quad strengthening exercises.  I do feel it is appropriate to try a steroid injection in her left knee joint today and she agrees with this and did tolerated injection well.  She is likely candidate for hyaluronic acid in the future for her left knee.  She has had this successfully for her right knee.  Will see her back in a month to see how the steroid is done for her.  All questions and concerns were answered and addressed.    Procedure Note  Patient: Sylvia Taylor             Date of Birth: 31-Oct-1970           MRN: [id number]             Visit Date: 08/25/2023  Procedures: Visit Diagnoses:  1. Chronic pain of left knee   2. Unilateral primary osteoarthritis, left knee   3. Morbid obesity with BMI of 60.0-69.9, adult (HCC)     Large Joint Inj: L knee on 08/25/2023 10:52 AM Indications: diagnostic evaluation and pain Details: 22 G  1.5 in needle, superolateral approach  Arthrogram: No  Medications: 3 mL lidocaine  1 %; 40 mg methylPREDNISolone  acetate 40 MG/ML Outcome: tolerated well, no immediate complications Procedure, treatment alternatives, risks and benefits explained, specific risks discussed. Consent was given by the patient. Immediately prior to procedure a time out was called to verify the correct patient, procedure, equipment, support staff and site/side marked as required. Patient was prepped and draped in the usual sterile fashion.

## 2023-09-07 ENCOUNTER — Encounter

## 2023-09-08 ENCOUNTER — Ambulatory Visit: Admitting: Nurse Practitioner

## 2023-09-08 VITALS — BP 152/90 | HR 81 | Temp 97.5°F | Ht 65.0 in | Wt 399.2 lb

## 2023-09-08 DIAGNOSIS — G43101 Migraine with aura, not intractable, with status migrainosus: Secondary | ICD-10-CM | POA: Diagnosis not present

## 2023-09-08 DIAGNOSIS — K219 Gastro-esophageal reflux disease without esophagitis: Secondary | ICD-10-CM | POA: Insufficient documentation

## 2023-09-08 DIAGNOSIS — R030 Elevated blood-pressure reading, without diagnosis of hypertension: Secondary | ICD-10-CM | POA: Diagnosis not present

## 2023-09-08 DIAGNOSIS — R748 Abnormal levels of other serum enzymes: Secondary | ICD-10-CM | POA: Insufficient documentation

## 2023-09-08 DIAGNOSIS — K5730 Diverticulosis of large intestine without perforation or abscess without bleeding: Secondary | ICD-10-CM | POA: Insufficient documentation

## 2023-09-08 MED ORDER — SUMATRIPTAN SUCCINATE 25 MG PO TABS: 25.0000 mg | ORAL_TABLET | ORAL | 0 refills | Status: DC | PRN

## 2023-09-08 NOTE — Progress Notes (Signed)
 Acute Office Visit  Subjective:     Patient ID: Sylvia Taylor, female    DOB: 09-01-1970, 53 y.o.   MRN: [id number]  Chief Complaint  Patient presents with   Migraine    Going on for 5 days now    Sylvia Taylor is a 53 year old female presenting with a 5-day history of migraine headache. She reports associated symptoms of nausea and photophobia. She denies blurred vision, coughing, ear pain, fever, sore throat, or vomiting. Sylvia Taylor has a known history of migraines but is not currently on any prescribed medication for them, stating, I don't have them all the time and I don't want to be on long-standing medication. I can always tell when I am going to have one. She has tried over-the-counter Excedrin and Tylenol , resting in a dark room as usual, but reports that this episode is not resolving. Her blood pressure has been elevated during her last two visits and again today, which she attributes to the headache.  She does not have a diagnosis of hypertension however her blood pressure has been elevated from last visit including today.  She is attributing high BP to her headache.  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   Elevated liver enzymes 09/08/2023   Gastroesophageal reflux disease 09/08/2023   Pes anserinus bursitis of left knee 06/24/2023   Primary osteoarthritis of both knees 06/24/2023   Migraine with aura and with status migrainosus, not intractable 09/08/2023   Elevated BP without diagnosis of hypertension 09/08/2023   Resolved Ambulatory Problems    Diagnosis Date Noted   Tear of medial  meniscus of right knee, subsequent encounter 09/16/2014   Status post arthroscopy of right knee 09/26/2014   Sprain of knee/leg, right, initial encounter 09/05/2014   Pneumonia due to influenza A virus 04/20/2017   Radicular pain of left upper extremity 07/18/2020   Patellofemoral chondrosis of left knee 03/09/2018   Overweight 07/08/2014   Left shoulder pain 07/18/2020   Right knee pain 07/08/2014   Irregular periods 02/09/2020   Decreased functional activity tolerance 03/23/2018   Left knee pain 02/13/2018   Acute hypoxemic respiratory failure (HCC) 03/06/2017   Primary localized osteoarthritis of left knee 02/13/2018   Primary osteoarthritis of right knee 02/27/2015   Thyroid  disorder 07/08/2014   Past Medical History:  Diagnosis Date   Allergy    GERD (gastroesophageal reflux disease)    PONV (postoperative nausea and vomiting)     Review of Systems  Constitutional:  Negative for chills and fever.  HENT:  Negative for ear pain and sore throat.   Eyes:  Positive for photophobia. Negative for blurred vision and double vision.  Respiratory:  Negative for cough, shortness of breath and wheezing.   Cardiovascular:  Negative for chest pain and leg swelling.  Gastrointestinal:  Positive for nausea. Negative for blood in stool and vomiting.  Musculoskeletal:  Negative for falls.  Skin:  Negative for itching and rash.  Neurological:  Positive for headaches.       5 days  days of throbbing, 7/10, not worst HA  Has appoinmnet with optometyr  at te end of August Negative unless indicated in HPI    Objective:    BP (!) 152/90   Pulse 81   Temp (!) 97.5 F (36.4 C) (Temporal)   Ht 5' 5 (1.651 m)   Wt (!) 399 lb 3.2 oz (181.1 kg)   LMP 09/13/2015   SpO2 96%   BMI 66.43 kg/m  BP Readings from Last 3 Encounters:  09/08/23 (!) 152/90  07/21/23 (!) 142/89  07/10/23 (!) 163/80   Wt Readings from Last 3 Encounters:  09/08/23 (!) 399 lb 3.2 oz (181.1 kg)  08/25/23 (!) 402 lb  (182.3 kg)  07/21/23 (!) 398 lb (180.5 kg)      Physical Exam Vitals and nursing note reviewed.  Constitutional:      Appearance: She is obese.  HENT:     Head: Normocephalic and atraumatic.     Nose: Nose normal.     Mouth/Throat:     Mouth: Mucous membranes are moist.  Eyes:     General: No scleral icterus.    Extraocular Movements: Extraocular movements intact.     Conjunctiva/sclera: Conjunctivae normal.     Pupils: Pupils are equal, round, and reactive to light.  Cardiovascular:     Heart sounds: Normal heart sounds.  Pulmonary:     Effort: Pulmonary effort is normal.     Breath sounds: Normal breath sounds.  Musculoskeletal:        General: Normal range of motion.     Right lower leg: No edema.     Left lower leg: No edema.  Skin:    General: Skin is warm and dry.     Findings: No rash.  Neurological:     General: No focal deficit present.     Mental Status: She is oriented to person, place, and time.  Psychiatric:        Mood and Affect: Mood normal.        Behavior: Behavior normal.        Thought Content: Thought content normal.        Judgment: Judgment normal.    Pertinent labs & imaging results that were available during my care of the patient were reviewed by me and considered in my medical decision making.  No results found for any visits on 09/08/23.      Assessment & Plan:  Migraine with aura and with status migrainosus, not intractable -     SUMAtriptan  Succinate; Take 1 tablet (25 mg total) by mouth every 2 (two) hours as needed for migraine. May repeat in 2 hours if headache persists or recurs.  Dispense: 10 tablet; Refill: 0  Elevated BP without diagnosis of hypertension   Sylvia Taylor is a 53 year old Caucasian female seen today for chronic migraine, no acute distress Start Imitrex  (sumatriptan ) for acute migraine treatment.  Provide a sample of Nurtec (rimegepant) for additional acute migraine management.  Educate patient on medication use,  potential side effects, and to seek care if symptoms worsen or new symptoms develop.  Monitor blood pressure regularly given recent elevations and assess for possible contributing factors.  BP log provided client to check BP twice a day and to send log via MyChart to her PCP  The above assessment and management plan was discussed with the patient. The patient verbalized understanding of and has agreed to the management plan. Patient is aware to call the clinic if they develop any new symptoms or if symptoms persist or worsen. Patient is aware when to return to the  clinic for a follow-up visit. Patient educated on when it is appropriate to go to the emergency department.  Return if symptoms worsen or fail to improve.  Sharalee Witman [place] Thompson, DNP [facility] [address] [place], [place] [address] [phone]  Note: This document was prepared by [employer] voice dictation technology and any errors that results from this process are unintentional.

## 2023-09-22 ENCOUNTER — Ambulatory Visit: Admitting: Orthopaedic Surgery

## 2023-09-22 ENCOUNTER — Encounter: Payer: Self-pay | Admitting: Orthopaedic Surgery

## 2023-09-22 DIAGNOSIS — G8929 Other chronic pain: Secondary | ICD-10-CM | POA: Diagnosis not present

## 2023-09-22 DIAGNOSIS — M25562 Pain in left knee: Secondary | ICD-10-CM

## 2023-09-22 DIAGNOSIS — M1712 Unilateral primary osteoarthritis, left knee: Secondary | ICD-10-CM | POA: Diagnosis not present

## 2023-09-22 DIAGNOSIS — Z6841 Body Mass Index (BMI) 40.0 and over, adult: Secondary | ICD-10-CM

## 2023-09-22 NOTE — Progress Notes (Signed)
 The patient comes in today for follow-up after having a steroid injection in her left knee to treat acute throbbing type of pain with that left knee.  She does have x-rays showing significant arthritis of the left knee.  She says the steroid injection has been helpful in terms of taking with the throbbing pain but she still dealing with some osteoarthritic pain with her left knee.  She has never had surgery on that knee.  She has had a knee arthroscopy on the right side.  Her only major comorbidity is her weight with a BMI of over 60.  Examination of her left knee today shows no effusion.  Her knees hyperextend.  She does have good range of motion of the knee with no effusion.  At this point I do feel that she is an appropriate candidate for hyaluronic acid for her left knee.  She has had this before in her right knee which helped significantly.  We will see if we get this ordered and approved for her to treat the osteoarthritis pain in her left knee.  She agrees with this treatment plan.  This patient is diagnosed with osteoarthritis of the knee(s).    Radiographs show evidence of joint space narrowing, osteophytes, subchondral sclerosis and/or subchondral cysts.  This patient has knee pain which interferes with functional and activities of daily living.    This patient has experienced inadequate response, adverse effects and/or intolerance with conservative treatments such as acetaminophen , NSAIDS, topical creams, physical therapy or regular exercise, knee bracing and/or weight loss.   This patient has experienced inadequate response or has a contraindication to intra articular steroid injections for at least 3 months.   This patient is not scheduled to have a total knee replacement within 6 months of starting treatment with viscosupplementation.

## 2023-09-23 ENCOUNTER — Telehealth: Payer: Self-pay

## 2023-09-23 NOTE — Telephone Encounter (Signed)
 Left knee gel injection ?

## 2023-09-28 ENCOUNTER — Other Ambulatory Visit: Payer: Self-pay | Admitting: Nurse Practitioner

## 2023-09-28 DIAGNOSIS — F411 Generalized anxiety disorder: Secondary | ICD-10-CM

## 2023-09-30 NOTE — Telephone Encounter (Signed)
 VOB submitted for Monovisc, left knee

## 2023-10-02 ENCOUNTER — Ambulatory Visit
Admission: RE | Admit: 2023-10-02 | Discharge: 2023-10-02 | Disposition: A | Discharge status: Home / Self Care | Source: Ambulatory Visit | Attending: Nurse Practitioner | Admitting: Nurse Practitioner

## 2023-10-02 DIAGNOSIS — Z1231 Encounter for screening mammogram for malignant neoplasm of breast: Secondary | ICD-10-CM

## 2023-10-05 ENCOUNTER — Other Ambulatory Visit: Payer: Self-pay | Admitting: Nurse Practitioner

## 2023-10-05 DIAGNOSIS — G43101 Migraine with aura, not intractable, with status migrainosus: Secondary | ICD-10-CM

## 2023-10-16 ENCOUNTER — Telehealth: Payer: Self-pay | Admitting: Orthopaedic Surgery

## 2023-10-16 ENCOUNTER — Other Ambulatory Visit: Payer: Self-pay

## 2023-10-16 DIAGNOSIS — M1712 Unilateral primary osteoarthritis, left knee: Secondary | ICD-10-CM

## 2023-10-16 NOTE — Telephone Encounter (Signed)
 Called and left a VM advising patient that PA is pending for Monovisc and once approved, I will call to schedule.

## 2023-10-16 NOTE — Telephone Encounter (Signed)
 Patient called and wanted to know if she got approved for the gel shots. CB#[phone]

## 2023-10-19 ENCOUNTER — Other Ambulatory Visit: Payer: Self-pay | Admitting: Nurse Practitioner

## 2023-10-29 ENCOUNTER — Encounter: Payer: Self-pay | Admitting: Physician Assistant

## 2023-10-29 ENCOUNTER — Ambulatory Visit: Admitting: Physician Assistant

## 2023-10-29 DIAGNOSIS — M1712 Unilateral primary osteoarthritis, left knee: Secondary | ICD-10-CM

## 2023-10-29 MED ORDER — HYALURONAN 88 MG/4ML IX SOSY
88.0000 mg | PREFILLED_SYRINGE | INTRA_ARTICULAR | Status: AC | PRN
  Administered 2023-10-29: 88 mg via INTRA_ARTICULAR

## 2023-10-29 NOTE — Progress Notes (Signed)
   Procedure Note  Patient: Sylvia Taylor             Date of Birth: September 24, 1970           MRN: [id number]             Visit Date: 10/29/2023 HPI: Sylvia Taylor comes in today for scheduled Monovisc injection left knee.  She is someone who has failed conservative treatment with NSAIDs, Tylenol  and cortisone injections.  She has significant arthritis of the knee left knee on radiographs.  She has no scheduled surgery on the left knee in the next  Physical exam: Left knee no abnormal warmth erythema or effusion good range of motion.   Procedures: Visit Diagnoses:  1. Unilateral primary osteoarthritis, left knee     Large Joint Inj: L knee on 10/29/2023 11:15 AM Indications: pain Details: 22 G 1.5 in needle, anterolateral approach  Arthrogram: No  Medications: 88 mg Hyaluronan 88 MG/4ML Outcome: tolerated well, no immediate complications Procedure, treatment alternatives, risks and benefits explained, specific risks discussed. Consent was given by the patient. Immediately prior to procedure a time out was called to verify the correct patient, procedure, equipment, support staff and site/side marked as required. Patient was prepped and draped in the usual sterile fashion.      Plan: Will see her back in 8 weeks to see what type of response she had to the Monovisc.  Knee was wrapped with Ace bandage today which she will remove before returning to bed this evening.  Quad strengthening exercises discussed.

## 2023-11-24 ENCOUNTER — Ambulatory Visit: Admitting: Nurse Practitioner

## 2023-11-24 ENCOUNTER — Ambulatory Visit (INDEPENDENT_AMBULATORY_CARE_PROVIDER_SITE_OTHER)

## 2023-11-24 ENCOUNTER — Encounter: Payer: Self-pay | Admitting: Nurse Practitioner

## 2023-11-24 VITALS — BP 125/81 | HR 70 | Temp 98.0°F | Ht 65.0 in | Wt >= 6400 oz

## 2023-11-24 DIAGNOSIS — R600 Localized edema: Secondary | ICD-10-CM | POA: Diagnosis not present

## 2023-11-24 DIAGNOSIS — Z23 Encounter for immunization: Secondary | ICD-10-CM

## 2023-11-24 DIAGNOSIS — G43101 Migraine with aura, not intractable, with status migrainosus: Secondary | ICD-10-CM

## 2023-11-24 DIAGNOSIS — E034 Atrophy of thyroid (acquired): Secondary | ICD-10-CM

## 2023-11-24 DIAGNOSIS — R1031 Right lower quadrant pain: Secondary | ICD-10-CM

## 2023-11-24 DIAGNOSIS — F411 Generalized anxiety disorder: Secondary | ICD-10-CM

## 2023-11-24 LAB — LIPID PANEL

## 2023-11-24 MED ORDER — ESCITALOPRAM OXALATE 10 MG PO TABS: 10.0000 mg | ORAL_TABLET | Freq: Every day | ORAL | 1 refills | Status: AC

## 2023-11-24 MED ORDER — LEVOTHYROXINE SODIUM 75 MCG PO TABS: 75.0000 ug | ORAL_TABLET | Freq: Every day | ORAL | 1 refills | Status: AC

## 2023-11-24 MED ORDER — OMEPRAZOLE 40 MG PO CPDR: 40.0000 mg | DELAYED_RELEASE_CAPSULE | Freq: Every morning | ORAL | 1 refills | Status: AC

## 2023-11-24 NOTE — Patient Instructions (Signed)

## 2023-11-24 NOTE — Addendum Note (Signed)
 Addended by: VIKTORIA ALAN MATSU on: 11/24/2023 04:42 PM   Modules accepted: Orders

## 2023-11-24 NOTE — Addendum Note (Signed)
 Addended by: GLADIS MUSTARD on: 11/24/2023 04:33 PM   Modules accepted: Level of Service

## 2023-11-24 NOTE — Progress Notes (Signed)
 Subjective:    Patient ID: Sylvia Taylor, female    DOB: 30-Jan-1971, 53 y.o.   MRN: [id number]   Chief Complaint: medical management of chronic issues     HPI:  Sylvia Taylor is a 53 y.o. who identifies as a female who was assigned female at birth.   Social history: Lives with: husband and children Work history: Engineer, site   Comes in today for follow up of the following chronic medical issues:  1. Hypothyroidism due to acquired atrophy of thyroid  No issues that she is aware of. Lab Results  Component Value Date   TSH 2.180 08/13/2022     2. Fluid retention in legs Has daily by the end of the day. Denies SOB  3. GAD (generalized anxiety disorder) Is on lexapro  and is doing well    11/24/2023    3:54 PM 07/21/2023    8:49 AM 12/26/2022    3:21 PM 09/17/2022    9:33 AM  GAD 7 : Generalized Anxiety Score  Nervous, Anxious, on Edge 0 0 0 0  Control/stop worrying 0 0 0 0  Worry too much - different things 0 0 0 0  Trouble relaxing 0 0 0 0  Restless 0 0 0 0  Easily annoyed or irritable 0 0 0 0  Afraid - awful might happen 0 0 0 0  Total GAD 7 Score 0 0 0 0  Anxiety Difficulty Not difficult at all Not difficult at all Not difficult at all Not difficult at all      4. Severe obesity (BMI >= 40) (HCC) Weight is up 11lbs  Wt Readings from Last 3 Encounters:  11/24/23 (!) 410 lb (186 kg)  09/08/23 (!) 399 lb 3.2 oz (181.1 kg)  08/25/23 (!) 402 lb (182.3 kg)   BMI Readings from Last 3 Encounters:  11/24/23 68.23 kg/m  09/08/23 66.43 kg/m  08/25/23 66.90 kg/m      New complaints: Abdominal pain- started about 2 months ago. Occurs daily mainly when she is walking. Says it is a catch that make she feel like she can't breathe. Pain is not in her chest it is in mid right abdomen. Denies constipation or diarrhea.   Allergies  Allergen Reactions   Amoxicillin  Hives   Outpatient Encounter Medications as of 11/24/2023  Medication Sig   cetirizine  (ZYRTEC )  10 MG tablet Take 10 mg by mouth daily as needed for allergies.   escitalopram  (LEXAPRO ) 10 MG tablet TAKE 1 TABLET BY MOUTH EVERY DAY   fluticasone  (FLONASE ) 50 MCG/ACT nasal spray Place 1 spray into both nostrils as needed for allergies or rhinitis.   levothyroxine  (SYNTHROID ) 75 MCG tablet TAKE 1 TABLET BY MOUTH DAILY-DR AUTHORIZED CHANGE IN MANUFACTURER   omeprazole  (PRILOSEC) 40 MG capsule TAKE 1 CAPSULE (40 MG TOTAL) BY MOUTH EVERY MORNING.   Probiotic Product (ALOE 10000 & PROBIOTICS PO) Probiotics   SUMAtriptan  (IMITREX ) 25 MG tablet TAKE 1 TABLET AS NEEDED FOR MIGRAINE. MAY REPEAT IN 2 HOURS IF HEADACHE PERSISTS OR RECURS.   No facility-administered encounter medications on file as of 11/24/2023.    Past Surgical History:  Procedure Laterality Date   BIOPSY  10/27/2020   Procedure: BIOPSY;  Surgeon: Rollin Dover, MD;  Location: [facility];  Service: Endoscopy;;   BREAST BIOPSY Left 05/27/2013   CESAREAN SECTION     CHOLECYSTECTOMY N/A 05/11/2020   Procedure: LAPAROSCOPIC CHOLECYSTECTOMY;  Surgeon: Vernetta Berg, MD;  Location: [facility];  Service: General;  Laterality: N/A;  COLONOSCOPY WITH PROPOFOL  N/A 03/24/2020   Procedure: COLONOSCOPY WITH PROPOFOL ;  Surgeon: Rollin Dover, MD;  Location: [facility];  Service: Endoscopy;  Laterality: N/A;   ESOPHAGOGASTRODUODENOSCOPY (EGD) WITH PROPOFOL  N/A 10/27/2020   Procedure: ESOPHAGOGASTRODUODENOSCOPY (EGD) WITH PROPOFOL ;  Surgeon: Rollin Dover, MD;  Location: [facility];  Service: Endoscopy;  Laterality: N/A;   KNEE ARTHROSCOPY Right 09/2014   LIVER BIOPSY N/A 05/11/2020   Procedure: LIVER BIOPSY;  Surgeon: Vernetta Berg, MD;  Location: [facility] OR;  Service: General;  Laterality: N/A;   POLYPECTOMY  03/24/2020   Procedure: POLYPECTOMY;  Surgeon: Rollin Dover, MD;  Location: [facility];  Service: Endoscopy;;    Family History  Problem Relation Age of Onset   Hypertension Father    Sleep apnea Father    Breast cancer  Maternal Grandmother 58 - 59   Ovarian cancer Maternal Grandmother    Thyroid  cancer Maternal Grandmother       Controlled substance contract: n/a     Review of Systems  Constitutional:  Negative for diaphoresis.  Eyes:  Negative for photophobia, pain and visual disturbance.  Respiratory:  Negative for shortness of breath.   Cardiovascular:  Negative for chest pain, palpitations and leg swelling.  Gastrointestinal:  Negative for abdominal pain.  Endocrine: Negative for polydipsia.  Skin:  Negative for rash.  Neurological:  Negative for dizziness, weakness and headaches.  Hematological:  Does not bruise/bleed easily.  All other systems reviewed and are negative.      Objective:   Physical Exam Vitals and nursing note reviewed.  Constitutional:      General: She is not in acute distress.    Appearance: Normal appearance. She is well-developed.  HENT:     Head: Normocephalic.     Right Ear: Tympanic membrane normal.     Left Ear: Tympanic membrane normal.     Nose: Nose normal.     Mouth/Throat:     Mouth: Mucous membranes are moist.  Eyes:     Pupils: Pupils are equal, round, and reactive to light.  Neck:     Vascular: No carotid bruit or JVD.  Cardiovascular:     Rate and Rhythm: Normal rate and regular rhythm.     Heart sounds: Normal heart sounds.  Pulmonary:     Effort: Pulmonary effort is normal. No respiratory distress.     Breath sounds: Normal breath sounds. No wheezing or rales.  Chest:     Chest wall: No tenderness.  Abdominal:     General: Bowel sounds are normal. There is no distension or abdominal bruit.     Palpations: Abdomen is soft. There is no hepatomegaly, splenomegaly, mass or pulsatile mass.     Tenderness: There is no abdominal tenderness.  Musculoskeletal:        General: Normal range of motion.     Cervical back: Normal range of motion and neck supple.  Lymphadenopathy:     Cervical: No cervical adenopathy.  Skin:    General: Skin is  warm and dry.  Neurological:     Mental Status: She is alert and oriented to person, place, and time.     Deep Tendon Reflexes: Reflexes are normal and symmetric.  Psychiatric:        Behavior: Behavior normal.        Thought Content: Thought content normal.        Judgment: Judgment normal.    BP 125/81   Pulse 70   Temp 98 F (36.7 C) (Temporal)   Ht 5'  5 (1.651 m)   Wt (!) 410 lb (186 kg)   LMP 09/13/2015   SpO2 94%   BMI 68.23 kg/m  KUB- moderate stool burden      Assessment & Plan:   Sylvia Taylor comes in today with chief complaint of medical management of chronic issues    Diagnosis and orders addressed:  1. Hypothyroidism due to acquired atrophy of thyroid  Labs pending - Thyroid  Panel With TSH - levothyroxine  (SYNTHROID ) 75 MCG tablet; Take 1 tablet (75 mcg total) by mouth daily.  Dispense: 90 tablet; Refill: 1  2. Fluid retention in legs Elevate legs when sitting - CBC with Differential/Platelet - CMP14+EGFR - Lipid panel  3. GAD (generalized anxiety disorder) Stress management - escitalopram  (LEXAPRO ) 10 MG tablet; Take 1 tablet (10 mg total) by mouth daily.  Dispense: 90 tablet; Refill: 1  4. Severe obesity (BMI >= 40) (HCC) Discussed diet and exercise for person with BMI >25 Will recheck weight in 3-6 months   5. Migraine with aura and without status migrainosus, not intractable Rest Cool compresses - ketorolac  (TORADOL ) injection 60 mg  6. Abdominal pain Probable constipation Miralax daily Increase fiber in diet If pain continues will do CT scan    Labs pending Health Maintenance reviewed Diet and exercise encouraged  Follow up plan: 6 months   Mary-Margaret Gladis, FNP

## 2023-11-25 ENCOUNTER — Ambulatory Visit: Payer: Self-pay | Admitting: Nurse Practitioner

## 2023-11-25 LAB — CMP14+EGFR
ALT: 36 IU/L — AB (ref 0–32)
AST: 23 IU/L (ref 0–40)
Albumin: 4.1 g/dL (ref 3.8–4.9)
Alkaline Phosphatase: 112 IU/L (ref 49–135)
BUN/Creatinine Ratio: 17 (ref 9–23)
BUN: 10 mg/dL (ref 6–24)
Bilirubin Total: 0.3 mg/dL (ref 0.0–1.2)
CO2: 24 mmol/L (ref 20–29)
Calcium: 9.7 mg/dL (ref 8.7–10.2)
Chloride: 100 mmol/L (ref 96–106)
Creatinine, Ser: 0.6 mg/dL (ref 0.57–1.00)
Globulin, Total: 2.9 g/dL (ref 1.5–4.5)
Glucose: 99 mg/dL (ref 70–99)
Potassium: 4.8 mmol/L (ref 3.5–5.2)
Sodium: 141 mmol/L (ref 134–144)
Total Protein: 7 g/dL (ref 6.0–8.5)
eGFR: 109 mL/min/1.73 (ref >59)

## 2023-11-25 LAB — CBC WITH DIFFERENTIAL/PLATELET
Basophils Absolute: 0.1 x10E3/uL (ref 0.0–0.2)
Basos: 1 %
EOS (ABSOLUTE): 0.2 x10E3/uL (ref 0.0–0.4)
Eos: 2 %
Hematocrit: 39.7 % (ref 34.0–46.6)
Hemoglobin: 12.3 g/dL (ref 11.1–15.9)
Immature Grans (Abs): 0 x10E3/uL (ref 0.0–0.1)
Immature Granulocytes: 0 %
Lymphocytes Absolute: 2.6 x10E3/uL (ref 0.7–3.1)
Lymphs: 25 %
MCH: 26.4 pg — ABNORMAL LOW (ref 26.6–33.0)
MCHC: 31 g/dL — ABNORMAL LOW (ref 31.5–35.7)
MCV: 85 fL (ref 79–97)
Monocytes Absolute: 0.6 x10E3/uL (ref 0.1–0.9)
Monocytes: 6 %
Neutrophils Absolute: 6.9 x10E3/uL (ref 1.4–7.0)
Neutrophils: 66 %
Platelets: 238 x10E3/uL (ref 150–450)
RBC: 4.66 x10E6/uL (ref 3.77–5.28)
RDW: 13.1 % (ref 11.7–15.4)
WBC: 10.4 x10E3/uL (ref 3.4–10.8)

## 2023-11-25 LAB — LIPID PANEL
Cholesterol, Total: 174 mg/dL (ref 100–199)
HDL: 43 mg/dL (ref >39)
LDL CALC COMMENT:: 4 ratio (ref 0.0–4.4)
LDL Chol Calc (NIH): 114 mg/dL — AB (ref 0–99)
Triglycerides: 93 mg/dL (ref 0–149)
VLDL Cholesterol Cal: 17 mg/dL (ref 5–40)

## 2023-12-01 DIAGNOSIS — R1085 Abdominal pain of multiple sites: Secondary | ICD-10-CM

## 2023-12-02 ENCOUNTER — Other Ambulatory Visit: Payer: Self-pay | Admitting: Nurse Practitioner

## 2023-12-02 DIAGNOSIS — R1085 Abdominal pain of multiple sites: Secondary | ICD-10-CM

## 2023-12-02 NOTE — Progress Notes (Signed)
Sylvia Taylor

## 2023-12-09 ENCOUNTER — Ambulatory Visit

## 2023-12-09 ENCOUNTER — Ambulatory Visit (HOSPITAL_COMMUNITY)
Admission: RE | Admit: 2023-12-09 | Discharge: 2023-12-09 | Disposition: A | Discharge status: Home / Self Care | Source: Ambulatory Visit | Attending: Nurse Practitioner | Admitting: Nurse Practitioner

## 2023-12-09 ENCOUNTER — Ambulatory Visit
Admission: RE | Admit: 2023-12-09 | Discharge: 2023-12-09 | Disposition: A | Discharge status: Home / Self Care | Attending: Family Medicine | Admitting: Family Medicine

## 2023-12-09 VITALS — BP 157/94 | HR 97 | Temp 98.7°F | Resp 20

## 2023-12-09 DIAGNOSIS — J22 Unspecified acute lower respiratory infection: Secondary | ICD-10-CM

## 2023-12-09 DIAGNOSIS — J4521 Mild intermittent asthma with (acute) exacerbation: Secondary | ICD-10-CM

## 2023-12-09 DIAGNOSIS — R030 Elevated blood-pressure reading, without diagnosis of hypertension: Secondary | ICD-10-CM | POA: Diagnosis not present

## 2023-12-09 DIAGNOSIS — R051 Acute cough: Secondary | ICD-10-CM

## 2023-12-09 DIAGNOSIS — R1085 Abdominal pain of multiple sites: Secondary | ICD-10-CM | POA: Insufficient documentation

## 2023-12-09 MED ORDER — ALBUTEROL SULFATE (2.5 MG/3ML) 0.083% IN NEBU
2.5000 mg | INHALATION_SOLUTION | Freq: Once | RESPIRATORY_TRACT | Status: AC
  Administered 2023-12-09: 2.5 mg via RESPIRATORY_TRACT

## 2023-12-09 MED ORDER — ALBUTEROL SULFATE HFA 108 (90 BASE) MCG/ACT IN AERS: 2.0000 | INHALATION_SPRAY | RESPIRATORY_TRACT | 0 refills | Status: AC | PRN

## 2023-12-09 MED ORDER — IOHEXOL 300 MG/ML  SOLN
100.0000 mL | Freq: Once | INTRAMUSCULAR | Status: AC | PRN
  Administered 2023-12-09: 100 mL via INTRAVENOUS

## 2023-12-09 MED ORDER — DEXAMETHASONE SOD PHOSPHATE PF 10 MG/ML IJ SOLN
10.0000 mg | Freq: Once | INTRAMUSCULAR | Status: AC
  Administered 2023-12-09: 10 mg via INTRAMUSCULAR

## 2023-12-09 MED ORDER — DOXYCYCLINE HYCLATE 100 MG PO CAPS: 100.0000 mg | ORAL_CAPSULE | Freq: Two times a day (BID) | ORAL | 0 refills | Status: DC

## 2023-12-09 MED ORDER — PROMETHAZINE-DM 6.25-15 MG/5ML PO SYRP: 5.0000 mL | ORAL_SOLUTION | Freq: Four times a day (QID) | ORAL | 0 refills | Status: DC | PRN

## 2023-12-09 NOTE — Discharge Instructions (Signed)
 We have given you a steroid shot today in clinic as well as an albuterol  breathing treatment.  I have prescribed an albuterol  inhaler, antibiotic and cough syrup additionally.  Continue your allergy regimen, and may take Coricidin HBP, plain Mucinex , saline sinus rinses, humidifiers and other supportive remedies.  These over-the-counter medications will not affect your blood pressure the way other cold and congestion medications well.  Follow-up for worsening or unresolving symptoms.  Will give you a call if your chest x-ray shows anything other than what we have already discussed.

## 2023-12-09 NOTE — ED Triage Notes (Signed)
 Pt reports she has some wheezing that worsens when she walks x 2 weeks. Feels like she cannot get a good breath. States It bubbles when I try to breath deep

## 2023-12-09 NOTE — ED Provider Notes (Signed)
 [facility]    CSN: [id number] Arrival date & time: 12/09/23  1511      History   Chief Complaint Chief Complaint  Patient presents with   Wheezing    Entered by patient    HPI Sylvia Taylor is a 53 y.o. female.   Patient presenting today with 2-week history of progressively worsening cough, wheezing and now rattling to the right lower lung, congestion, sinus pressure.  Denies fevers, chest pain, abdominal pain, vomiting, diarrhea.  History of seasonal allergies and states that always gets worse this time of year.  Has been taking her antihistamine, montelukast, and has an albuterol  inhaler at home but states that has not been helping the past few days.    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   Elevated liver enzymes 09/08/2023   Gastroesophageal reflux disease 09/08/2023   Migraine with aura and with status migrainosus, not intractable 09/08/2023   Elevated BP without diagnosis of hypertension 09/08/2023   Pes anserinus bursitis of left knee 06/24/2023   Primary osteoarthritis of both knees 06/24/2023   GAD (generalized anxiety disorder) 02/14/2022   Obstructive sleep apnea 04/20/2017   Severe obesity (BMI >= 40) (HCC) 06/06/2014   Fluid retention in legs 06/02/2013   Hypothyroidism 11/03/2012   Allergic rhinitis 11/03/2012    Past Surgical History:  Procedure Laterality Date   BIOPSY  10/27/2020   Procedure: BIOPSY;  Surgeon: Rollin Dover, MD;  Location: [facility];  Service: Endoscopy;;   BREAST BIOPSY Left 05/27/2013   CESAREAN SECTION     CHOLECYSTECTOMY N/A 05/11/2020   Procedure: LAPAROSCOPIC CHOLECYSTECTOMY;  Surgeon: Vernetta Berg, MD;  Location: [facility] OR;  Service: General;  Laterality: N/A;   COLONOSCOPY WITH PROPOFOL  N/A  03/24/2020   Procedure: COLONOSCOPY WITH PROPOFOL ;  Surgeon: Rollin Dover, MD;  Location: [facility];  Service: Endoscopy;  Laterality: N/A;   ESOPHAGOGASTRODUODENOSCOPY (EGD) WITH PROPOFOL  N/A 10/27/2020   Procedure: ESOPHAGOGASTRODUODENOSCOPY (EGD) WITH PROPOFOL ;  Surgeon: Rollin Dover, MD;  Location: [facility];  Service: Endoscopy;  Laterality: N/A;   KNEE ARTHROSCOPY Right 09/2014   LIVER BIOPSY N/A 05/11/2020   Procedure: LIVER BIOPSY;  Surgeon: Vernetta Berg, MD;  Location: [facility] OR;  Service: General;  Laterality: N/A;   POLYPECTOMY  03/24/2020   Procedure: POLYPECTOMY;  Surgeon: Rollin Dover, MD;  Location: [facility];  Service: Endoscopy;;    OB History   No obstetric history on file.      Home Medications    Prior to Admission medications   Medication Sig Start Date End Date Taking? Authorizing Provider  albuterol  (VENTOLIN  HFA) 108 (90 Base) MCG/ACT inhaler Inhale 2 puffs into the lungs every 4 (four) hours as needed. 12/09/23  Yes Stuart Vernell Norris, PA-C  doxycycline  (VIBRAMYCIN ) 100 MG capsule Take 1 capsule (100 mg total) by mouth 2 (two) times daily. 12/09/23  Yes Stuart Vernell Norris, PA-C  promethazine-dextromethorphan (PROMETHAZINE-DM) 6.25-15 MG/5ML syrup Take 5 mLs by mouth 4 (four) times daily as needed. 12/09/23  Yes Stuart Vernell Norris, PA-C  cetirizine  (ZYRTEC ) 10 MG tablet Take 10 mg by mouth daily as needed for allergies.    [provider]  escitalopram  (LEXAPRO ) 10 MG tablet Take 1 tablet (10 mg total) by mouth daily. 11/24/23   Gladis, Mary-Margaret,  FNP  fluticasone  (FLONASE ) 50 MCG/ACT nasal spray Place 1 spray into both nostrils as needed for allergies or rhinitis.    [provider]  levothyroxine  (SYNTHROID ) 75 MCG tablet Take 1 tablet (75 mcg total) by mouth daily before breakfast. 11/24/23   Gladis, Mary-Margaret, FNP  omeprazole  (PRILOSEC) 40 MG capsule Take 1 capsule (40 mg total) by mouth every morning. 11/24/23    Gladis Mary-Margaret, FNP  Probiotic Product (ALOE 89999 & PROBIOTICS PO) Probiotics    [provider]  SUMAtriptan  (IMITREX ) 25 MG tablet TAKE 1 TABLET AS NEEDED FOR MIGRAINE. MAY REPEAT IN 2 HOURS IF HEADACHE PERSISTS OR RECURS. 10/06/23   Gladis Mustard, FNP    Family History Family History  Problem Relation Age of Onset   Hypertension Father    Sleep apnea Father    Breast cancer Maternal Grandmother 55 - 59   Ovarian cancer Maternal Grandmother    Thyroid  cancer Maternal Grandmother     Social History Social History   Tobacco Use   Smoking status: Never   Smokeless tobacco: Never  Vaping Use   Vaping status: Never Used  Substance Use Topics   Alcohol use: No   Drug use: No     Allergies   Amoxicillin    Review of Systems Review of Systems PER HPI  Physical Exam Triage Vital Signs ED Triage Vitals  Encounter Vitals Group     BP 12/09/23 1540 (!) 157/94     Girls Systolic BP Percentile --      Girls Diastolic BP Percentile --      Boys Systolic BP Percentile --      Boys Diastolic BP Percentile --      Pulse Rate 12/09/23 1540 97     Resp 12/09/23 1540 20     Temp 12/09/23 1540 98.7 F (37.1 C)     Temp Source 12/09/23 1540 Oral     SpO2 12/09/23 1540 91 %     Weight --      Height --      Head Circumference --      Peak Flow --      Pain Score 12/09/23 1541 0     Pain Loc --      Pain Education --      Exclude from Growth Chart --    No data found.  Updated Vital Signs BP (!) 157/94 (BP Location: Right Arm)   Pulse 97   Temp 98.7 F (37.1 C) (Oral)   Resp 20   LMP 09/13/2015   SpO2 91%   Visual Acuity Right Eye Distance:   Left Eye Distance:   Bilateral Distance:    Right Eye Near:   Left Eye Near:    Bilateral Near:     Physical Exam Vitals and nursing note reviewed.  Constitutional:      Appearance: Normal appearance.  HENT:     Head: Atraumatic.     Right Ear: Tympanic membrane and external ear normal.      Left Ear: Tympanic membrane and external ear normal.     Nose: Congestion present.     Mouth/Throat:     Mouth: Mucous membranes are moist.     Pharynx: Posterior oropharyngeal erythema present.  Eyes:     Extraocular Movements: Extraocular movements intact.     Conjunctiva/sclera: Conjunctivae normal.  Cardiovascular:     Rate and Rhythm: Normal rate and regular rhythm.     Heart sounds: Normal heart sounds.  Pulmonary:  Effort: Pulmonary effort is normal.     Breath sounds: Wheezing and rales present.  Musculoskeletal:        General: Normal range of motion.     Cervical back: Normal range of motion and neck supple.  Skin:    General: Skin is warm and dry.  Neurological:     Mental Status: She is alert and oriented to person, place, and time.  Psychiatric:        Mood and Affect: Mood normal.        Thought Content: Thought content normal.      UC Treatments / Results  Labs (all labs ordered are listed, but only abnormal results are displayed) Labs Reviewed - No data to display  EKG   Radiology DG Chest 2 View Result Date: 12/09/2023 CLINICAL DATA:  Shortness of breath and wheezing EXAM: CHEST - 2 VIEW COMPARISON:  10/22/2022 FINDINGS: central airways thickening. Patchy atelectasis or minimal infiltrate in the right infrahilar lung. Stable cardiomediastinal silhouette. No pneumothorax IMPRESSION: Central airways thickening with patchy atelectasis or minimal infiltrate in the right infrahilar lung. Electronically Signed   By: Luke Bun M.D.   On: 12/09/2023 16:25    Procedures Procedures (including critical care time)  Medications Ordered in UC Medications  albuterol  (PROVENTIL ) (2.5 MG/3ML) 0.083% nebulizer solution 2.5 mg (2.5 mg Nebulization Given 12/09/23 1547)  dexamethasone  (DECADRON ) injection 10 mg (10 mg Intramuscular Given 12/09/23 1631)    Initial Impression / Assessment and Plan / UC Course  I have reviewed the triage vital signs and the nursing  notes.  Pertinent labs & imaging results that were available during my care of the patient were reviewed by me and considered in my medical decision making (see chart for details).     X-ray today showing diffuse inflammatory changes and possible right sided infiltrate.  This consistent with exam.  She did have some relief after albuterol  nebulizer treatment and oxygen saturation maintaining 91% to 95% on room air.  She is mildly hypertensive in triage, list of safe over-the-counter cold and congestion medications reviewed and will treat with IM Decadron , doxycycline , Phenergan DM, albuterol .  Discussed supportive home care and return precautions.  Work note given.  Final Clinical Impressions(s) / UC Diagnoses   Final diagnoses:  Acute cough  Lower respiratory infection  Mild intermittent reactive airway disease with acute exacerbation  Elevated blood pressure reading     Discharge Instructions      We have given you a steroid shot today in clinic as well as an albuterol  breathing treatment.  I have prescribed an albuterol  inhaler, antibiotic and cough syrup additionally.  Continue your allergy regimen, and may take Coricidin HBP, plain Mucinex , saline sinus rinses, humidifiers and other supportive remedies.  These over-the-counter medications will not affect your blood pressure the way other cold and congestion medications well.  Follow-up for worsening or unresolving symptoms.  Will give you a call if your chest x-ray shows anything other than what we have already discussed.    ED Prescriptions     Medication Sig Dispense Auth. Provider   doxycycline  (VIBRAMYCIN ) 100 MG capsule Take 1 capsule (100 mg total) by mouth 2 (two) times daily. 14 capsule Stuart Vernell Norris, PA-C   promethazine-dextromethorphan (PROMETHAZINE-DM) 6.25-15 MG/5ML syrup Take 5 mLs by mouth 4 (four) times daily as needed. 100 mL Stuart Vernell Norris, PA-C   albuterol  (VENTOLIN  HFA) 108 [phone] Base) MCG/ACT  inhaler Inhale 2 puffs into the lungs every 4 (four) hours as needed.  18 g Stuart Vernell Norris, [place]      PDMP not reviewed this encounter.   Stuart Vernell Norris, [place] 12/09/23 1651

## 2023-12-11 ENCOUNTER — Ambulatory Visit: Payer: Self-pay | Admitting: Nurse Practitioner

## 2023-12-11 MED ORDER — CIPROFLOXACIN HCL 500 MG PO TABS: 500.0000 mg | ORAL_TABLET | Freq: Two times a day (BID) | ORAL | 0 refills | Status: DC

## 2023-12-11 NOTE — Telephone Encounter (Signed)
 Reviewed results with patient and she voiced understanding.

## 2023-12-15 ENCOUNTER — Encounter: Payer: Self-pay | Admitting: Radiology

## 2023-12-20 ENCOUNTER — Encounter (HOSPITAL_COMMUNITY): Payer: Self-pay | Admitting: *Deleted

## 2023-12-20 ENCOUNTER — Other Ambulatory Visit: Payer: Self-pay

## 2023-12-20 ENCOUNTER — Ambulatory Visit (HOSPITAL_COMMUNITY)
Admission: EM | Admit: 2023-12-20 | Discharge: 2023-12-20 | Disposition: A | Discharge status: Home / Self Care | Attending: Emergency Medicine | Admitting: Emergency Medicine

## 2023-12-20 ENCOUNTER — Ambulatory Visit (INDEPENDENT_AMBULATORY_CARE_PROVIDER_SITE_OTHER)

## 2023-12-20 DIAGNOSIS — R0602 Shortness of breath: Secondary | ICD-10-CM

## 2023-12-20 DIAGNOSIS — J22 Unspecified acute lower respiratory infection: Secondary | ICD-10-CM | POA: Diagnosis not present

## 2023-12-20 DIAGNOSIS — R062 Wheezing: Secondary | ICD-10-CM

## 2023-12-20 MED ORDER — FLUTICASONE PROPIONATE HFA 110 MCG/ACT IN AERO: 1.0000 | INHALATION_SPRAY | Freq: Two times a day (BID) | RESPIRATORY_TRACT | 0 refills | Status: AC

## 2023-12-20 MED ORDER — ALBUTEROL SULFATE (2.5 MG/3ML) 0.083% IN NEBU
2.5000 mg | INHALATION_SOLUTION | Freq: Once | RESPIRATORY_TRACT | Status: AC
  Administered 2023-12-20: 2.5 mg via RESPIRATORY_TRACT

## 2023-12-20 MED ORDER — ALBUTEROL SULFATE (2.5 MG/3ML) 0.083% IN NEBU
INHALATION_SOLUTION | RESPIRATORY_TRACT | Status: AC
Start: 2023-12-20 — End: 2023-12-20
  Filled 2023-12-20: qty 3

## 2023-12-20 MED ORDER — PREDNISONE 20 MG PO TABS: 40.0000 mg | ORAL_TABLET | Freq: Every day | ORAL | 0 refills | Status: AC

## 2023-12-20 NOTE — ED Triage Notes (Signed)
 PT reports SHOB and wheezing for a week . Pt went to UC on 10//28 /25 . Pt finished Meds but is now [facility] . Pt reports when she uses the inhaler she feels worse.

## 2023-12-20 NOTE — ED Provider Notes (Signed)
 [facility]    CSN: [id number] Arrival date & time: 12/20/23  1755      History   Chief Complaint Chief Complaint  Patient presents with   Wheezing   Shortness of Breath    HPI Sylvia Taylor is a 53 y.o. female. She was seen in urgent care 12/09/23 because she felt like she had a right sided pneumonia, and she was correct. Given IM decadron  and breathing treatment in urgent care, sent home with albuterol  inhaler and doxycycline . Also with cough medicine but she has not used it and it not coughing. She felt like she was getting better until 3 days ago, when she began feeling worse again. Frequent wheezing and chills. Last used albuterol  yesterday x2, but has not used it today because she feels it makes the wheezing worse. No upper respiratory sx.    Wheezing Associated symptoms: shortness of breath   Shortness of Breath Associated symptoms: wheezing     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   Elevated liver enzymes 09/08/2023   Gastroesophageal reflux disease 09/08/2023   Migraine with aura and with status migrainosus, not intractable 09/08/2023   Elevated BP without diagnosis of hypertension 09/08/2023   Pes anserinus bursitis of left knee 06/24/2023   Primary osteoarthritis of both knees 06/24/2023   GAD (generalized anxiety disorder) 02/14/2022   Obstructive sleep apnea 04/20/2017   Severe obesity (BMI >= 40) (HCC) 06/06/2014   Fluid retention in legs 06/02/2013   Hypothyroidism 11/03/2012   Allergic rhinitis 11/03/2012    Past Surgical History:  Procedure Laterality Date   BIOPSY  10/27/2020   Procedure: BIOPSY;  Surgeon: Rollin Dover, MD;  Location: [facility];  Service: Endoscopy;;   BREAST BIOPSY Left 05/27/2013   CESAREAN SECTION      CHOLECYSTECTOMY N/A 05/11/2020   Procedure: LAPAROSCOPIC CHOLECYSTECTOMY;  Surgeon: Vernetta Berg, MD;  Location: [facility] OR;  Service: General;  Laterality: N/A;   COLONOSCOPY WITH PROPOFOL  N/A 03/24/2020   Procedure: COLONOSCOPY WITH PROPOFOL ;  Surgeon: Rollin Dover, MD;  Location: [facility];  Service: Endoscopy;  Laterality: N/A;   ESOPHAGOGASTRODUODENOSCOPY (EGD) WITH PROPOFOL  N/A 10/27/2020   Procedure: ESOPHAGOGASTRODUODENOSCOPY (EGD) WITH PROPOFOL ;  Surgeon: Rollin Dover, MD;  Location: [facility];  Service: Endoscopy;  Laterality: N/A;   KNEE ARTHROSCOPY Right 09/2014   LIVER BIOPSY N/A 05/11/2020   Procedure: LIVER BIOPSY;  Surgeon: Vernetta Berg, MD;  Location: [facility] OR;  Service: General;  Laterality: N/A;   POLYPECTOMY  03/24/2020   Procedure: POLYPECTOMY;  Surgeon: Rollin Dover, MD;  Location: [facility];  Service: Endoscopy;;    OB History   No obstetric history on file.      Home Medications    Prior to Admission medications   Medication Sig Start Date End Date Taking? Authorizing Provider  albuterol  (VENTOLIN  HFA) 108 (90 Base) MCG/ACT inhaler Inhale 2 puffs into the lungs every 4 (four) hours as needed. 12/09/23  Yes Stuart Vernell Norris, PA-C  cetirizine  (ZYRTEC ) 10 MG tablet Take 10 mg by mouth daily as needed for allergies.   Yes [provider]  ciprofloxacin  (CIPRO ) 500 MG tablet Take 1 tablet (500 mg total) by mouth 2 (two) times daily. 12/11/23  Yes Martin, Mary-Margaret, FNP  escitalopram  (LEXAPRO ) 10 MG tablet  Take 1 tablet (10 mg total) by mouth daily. 11/24/23  Yes Martin, Mary-Margaret, FNP  fluticasone  (FLONASE ) 50 MCG/ACT nasal spray Place 1 spray into both nostrils as needed for allergies or rhinitis.   Yes [provider]  fluticasone  (FLOVENT  HFA) 110 MCG/ACT inhaler Inhale 1 puff into the lungs 2 (two) times daily. 12/20/23  Yes Richad Jon HERO, NP  levothyroxine  (SYNTHROID ) 75 MCG tablet Take 1 tablet (75 mcg total) by  mouth daily before breakfast. 11/24/23  Yes Gladis, Mary-Margaret, FNP  omeprazole  (PRILOSEC) 40 MG capsule Take 1 capsule (40 mg total) by mouth every morning. 11/24/23  Yes Martin, Mary-Margaret, FNP  predniSONE  (DELTASONE ) 20 MG tablet Take 2 tablets (40 mg total) by mouth daily for 5 days. 12/20/23 12/25/23 Yes Richad Jon HERO, NP  Probiotic Product (ALOE 10000 & PROBIOTICS PO) Probiotics   Yes [provider]  SUMAtriptan  (IMITREX ) 25 MG tablet TAKE 1 TABLET AS NEEDED FOR MIGRAINE. MAY REPEAT IN 2 HOURS IF HEADACHE PERSISTS OR RECURS. 10/06/23  Yes Gladis, Mary-Margaret, FNP  doxycycline  (VIBRAMYCIN ) 100 MG capsule Take 1 capsule (100 mg total) by mouth 2 (two) times daily. 12/09/23   Stuart Vernell Norris, PA-C  promethazine-dextromethorphan (PROMETHAZINE-DM) 6.25-15 MG/5ML syrup Take 5 mLs by mouth 4 (four) times daily as needed. 12/09/23   Stuart Vernell Norris, PA-C    Family History Family History  Problem Relation Age of Onset   Hypertension Father    Sleep apnea Father    Breast cancer Maternal Grandmother 67 - 59   Ovarian cancer Maternal Grandmother    Thyroid  cancer Maternal Grandmother     Social History Social History   Tobacco Use   Smoking status: Never   Smokeless tobacco: Never  Vaping Use   Vaping status: Never Used  Substance Use Topics   Alcohol use: No   Drug use: No     Allergies   Amoxicillin    Review of Systems Review of Systems  Respiratory:  Positive for shortness of breath and wheezing.      Physical Exam Triage Vital Signs ED Triage Vitals  Encounter Vitals Group     BP 12/20/23 1808 (!) 152/88     Girls Systolic BP Percentile --      Girls Diastolic BP Percentile --      Boys Systolic BP Percentile --      Boys Diastolic BP Percentile --      Pulse Rate 12/20/23 1808 81     Resp 12/20/23 1808 20     Temp 12/20/23 1808 98.3 F (36.8 C)     Temp src --      SpO2 12/20/23 1808 93 %     Weight --      Height --       Head Circumference --      Peak Flow --      Pain Score 12/20/23 1805 0     Pain Loc --      Pain Education --      Exclude from Growth Chart --    No data found.  Updated Vital Signs BP (!) 152/88   Pulse 81   Temp 98.3 F (36.8 C)   Resp 20   LMP 09/13/2015   SpO2 93%   Visual Acuity Right Eye Distance:   Left Eye Distance:   Bilateral Distance:    Right Eye Near:   Left Eye Near:    Bilateral Near:     Physical Exam Constitutional:  General: She is not in acute distress.    Appearance: She is well-developed. She is obese. She is not ill-appearing.  Cardiovascular:     Rate and Rhythm: Normal rate and regular rhythm.  Pulmonary:     Effort: Pulmonary effort is normal.     Comments: Diffuse mild wheezing Neurological:     Mental Status: She is alert.      UC Treatments / Results  Labs (all labs ordered are listed, but only abnormal results are displayed) Labs Reviewed - No data to display  EKG   Radiology DG Chest 2 View Result Date: 12/20/2023 EXAM: 2 VIEW(S) XRAY OF THE CHEST 12/20/2023 07:15:51 PM COMPARISON: Comparison with 12/09/2023. CLINICAL HISTORY: recently treated for pneumonia on R, getting worse after getting better for a few days, now thinks has pna on the L too FINDINGS: LUNGS AND PLEURA: More central airway thickening. Patchy atelectasis or pneumonia in the right infrahilar lung. No pulmonary edema. No pleural effusion. No pneumothorax. HEART AND MEDIASTINUM: No acute abnormality of the cardiac and mediastinal silhouettes. BONES AND SOFT TISSUES: No acute osseous abnormality. IMPRESSION: 1. Similar atelectasis or pneumonia in the right infrahilar lung. 2. Central airway thickening. Electronically signed by: Norman Gatlin MD 12/20/2023 07:18 PM EST RP Workstation: [id number]    Procedures Procedures (including critical care time)  Medications Ordered in UC Medications  albuterol  (PROVENTIL ) (2.5 MG/3ML) 0.083% nebulizer solution 2.5 mg  (2.5 mg Nebulization Given 12/20/23 1843)    Initial Impression / Assessment and Plan / UC Course  I have reviewed the triage vital signs and the nursing notes.  Pertinent labs & imaging results that were available during my care of the patient were reviewed by me and considered in my medical decision making (see chart for details).     CXR stable. Wheezing completely resolved after albuterol  nebs. I wonder if pt's feeling of the home albuterol  inhaler making wheezing worse is due to incomplete relief of wheezing with albuterol  MDI? She is using a spacer. Discussed options. I will have her use flovent  daily for a month to try to prevent wheezing and give her 5 days prednisone  for now. She will use albuterol  prn still and return if wheezing uncontrolled again.    Final Clinical Impressions(s) / UC Diagnoses   Final diagnoses:  SOB (shortness of breath)  Lower respiratory infection  Wheezing     Discharge Instructions      Use the Fluticasone  (flovent ) inhaler twice a day even if you are feeling well for the next month. Use the albuterol  inhaler if you are wheezing or short of breath. Use the spacer with both inhalers.      ED Prescriptions     Medication Sig Dispense Auth. Provider   fluticasone  (FLOVENT  HFA) 110 MCG/ACT inhaler Inhale 1 puff into the lungs 2 (two) times daily. 1 each Richad Jon HERO, NP   predniSONE  (DELTASONE ) 20 MG tablet Take 2 tablets (40 mg total) by mouth daily for 5 days. 10 tablet Richad Jon HERO, NP      PDMP not reviewed this encounter.   Richad Jon HERO, NP 12/20/23 1942

## 2023-12-20 NOTE — Discharge Instructions (Addendum)
 Use the Fluticasone  (flovent ) inhaler twice a day even if you are feeling well for the next month. Use the albuterol  inhaler if you are wheezing or short of breath. Use the spacer with both inhalers.

## 2023-12-24 ENCOUNTER — Ambulatory Visit: Admitting: Physician Assistant

## 2023-12-29 ENCOUNTER — Encounter: Payer: Self-pay | Admitting: Physician Assistant

## 2023-12-29 ENCOUNTER — Ambulatory Visit: Admitting: Physician Assistant

## 2023-12-29 DIAGNOSIS — M1712 Unilateral primary osteoarthritis, left knee: Secondary | ICD-10-CM | POA: Diagnosis not present

## 2023-12-29 NOTE — Progress Notes (Signed)
 HPI: Sylvia Taylor comes in today follow-up 8 weeks status post left knee viscosupplementation injection.  She states that her knee pain is much better.  Does not awaken her at night.  She has some stiffness whenever she first goes to walk after sitting for long periods and has to wait a second before taking a step.  But overall her knee pain is much improved.  She does take ibuprofen which seems to be beneficial without upsetting her stomach.  No new injuries to the left knee.  Reports she continues to do quad strengthening exercises and [facility] [employer] [facility]  exercise.  Review of systems: See HPI otherwise negative  Physical exam: General: Well-developed well-nourished female in no acute distress ambulates without any assistive device. Left knee: Good range of motion.  No abnormal warmth erythema or effusion.  No instability valgus varus stressing.  Impression: Left knee arthritis  Plan: Continue to work on dance movement psychotherapist.  She knows to wait 6 months between viscosupplementation injections and 3 months between gel injections.  She will follow-up with us  as needed.  Questions were encouraged and answered

## 2024-02-07 ENCOUNTER — Telehealth: Admitting: Nurse Practitioner

## 2024-02-07 DIAGNOSIS — R21 Rash and other nonspecific skin eruption: Secondary | ICD-10-CM | POA: Diagnosis not present

## 2024-02-07 MED ORDER — TRIAMCINOLONE ACETONIDE 0.5 % EX OINT: 1.0000 | TOPICAL_OINTMENT | Freq: Two times a day (BID) | CUTANEOUS | 0 refills | Status: DC

## 2024-02-07 MED ORDER — HYDROXYZINE HCL 10 MG PO TABS: 10.0000 mg | ORAL_TABLET | Freq: Three times a day (TID) | ORAL | 0 refills | Status: AC | PRN

## 2024-02-07 NOTE — Progress Notes (Signed)
 E Visit for Rash  We are sorry that you are not feeling well. Here is how we plan to help!  I am prescribing triamcinolone  0.1 % cream -- apply to the affected area(s) in a thin layer, twice daily for up to 14 days. Do not apply to face, privates or armpit regions. I have prescribed hydroxyzine  for the itching.    HOME CARE:  Take cool showers and avoid direct sunlight. Apply cool compress or wet dressings. Take a bath in an oatmeal bath.  Sprinkle content of one Aveeno packet under running faucet with comfortably warm water .  Bathe for 15-20 minutes, 1-2 times daily.  Pat dry with a towel. Do not rub the rash. Use hydrocortisone cream. Take an antihistamine like Benadryl  for widespread rashes that itch.  The adult dose of Benadryl  is 25-50 mg by mouth 4 times daily. Caution:  This type of medication may cause sleepiness.  Do not drink alcohol, drive, or operate dangerous machinery while taking antihistamines.  Do not take these medications if you have prostate enlargement.  Read package instructions thoroughly on all medications that you take.  GET HELP RIGHT AWAY IF:  Symptoms don't go away after treatment. Severe itching that persists. If you rash spreads or swells. If you rash begins to smell. If it blisters and opens or develops a yellow-brown crust. You develop a fever. You have a sore throat. You become short of breath.  MAKE SURE YOU:  Understand these instructions. Will watch your condition. Will get help right away if you are not doing well or get worse.  Thank you for choosing an e-visit. Your e-visit answers were reviewed by a board certified advanced clinical practitioner to complete your personal care plan. Depending upon the condition, your plan could have included both over the counter or prescription medications. Please review your pharmacy choice. Be sure that the pharmacy you have chosen is open so that you can pick up your prescription now.  If there is a  problem you may message your provider in MyChart to have the prescription routed to another pharmacy. Your safety is important to us . If you have drug allergies check your prescription carefully.  For the next 24 hours, you can use MyChart to ask questions about todays visit, request a non-urgent call back, or ask for a work or school excuse from your e-visit provider. You will get an email in the next two days asking about your experience. I hope that your e-visit has been valuable and will speed your recovery.  I have spent 5 minutes in review of e-visit questionnaire, review and updating patient chart, medical decision making and response to patient.   Shana Zavaleta W Eloyse Causey, NP

## 2024-02-12 ENCOUNTER — Ambulatory Visit
Admission: RE | Admit: 2024-02-12 | Discharge: 2024-02-12 | Disposition: A | Discharge status: Home / Self Care | Attending: Family Medicine | Admitting: Family Medicine

## 2024-02-12 ENCOUNTER — Other Ambulatory Visit: Payer: Self-pay

## 2024-02-12 VITALS — BP 143/86 | HR 81 | Temp 98.2°F | Resp 20

## 2024-02-12 DIAGNOSIS — H6122 Impacted cerumen, left ear: Secondary | ICD-10-CM | POA: Diagnosis not present

## 2024-02-12 NOTE — ED Provider Notes (Signed)
 " [facility]    CSN: [id number] Arrival date & time: 02/12/24  1446      History   Chief Complaint Chief Complaint  Patient presents with   Ear Fullness    Left Ear has been stopped up since Sunday. I've been using Debrox everyday since then, but I have had no relief. I cannot hear out of that ear at all and it feels full. I have also been using Zyrtec  and Flonase  everyday to see if that would help. - Entered by patient    HPI Sylvia Taylor is a 54 y.o. female.   Patient presenting today with almost a week of left ear muffled hearing, fullness.  Denies bleeding, drainage, headache, nausea, vomiting, dizziness, upper respiratory symptoms.  So far trying Debrox over-the-counter with minimal relief.    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   Elevated liver enzymes 09/08/2023   Gastroesophageal reflux disease 09/08/2023   Migraine with aura and with status migrainosus, not intractable 09/08/2023   Elevated BP without diagnosis of hypertension 09/08/2023   Pes anserinus bursitis of left knee 06/24/2023   Primary osteoarthritis of both knees 06/24/2023   GAD (generalized anxiety disorder) 02/14/2022   Obstructive sleep apnea 04/20/2017   Severe obesity (BMI >= 40) (HCC) 06/06/2014   Fluid retention in legs 06/02/2013   Hypothyroidism 11/03/2012   Allergic rhinitis 11/03/2012    Past Surgical History:  Procedure Laterality Date   BIOPSY  10/27/2020   Procedure: BIOPSY;  Surgeon: Rollin Dover, MD;  Location: [facility];  Service: Endoscopy;;   BREAST BIOPSY Left 05/27/2013   CESAREAN SECTION     CHOLECYSTECTOMY N/A 05/11/2020   Procedure: LAPAROSCOPIC CHOLECYSTECTOMY;  Surgeon: Vernetta Berg, MD;  Location: [facility] OR;  Service: General;   Laterality: N/A;   COLONOSCOPY WITH PROPOFOL  N/A 03/24/2020   Procedure: COLONOSCOPY WITH PROPOFOL ;  Surgeon: Rollin Dover, MD;  Location: [facility];  Service: Endoscopy;  Laterality: N/A;   ESOPHAGOGASTRODUODENOSCOPY (EGD) WITH PROPOFOL  N/A 10/27/2020   Procedure: ESOPHAGOGASTRODUODENOSCOPY (EGD) WITH PROPOFOL ;  Surgeon: Rollin Dover, MD;  Location: [facility];  Service: Endoscopy;  Laterality: N/A;   KNEE ARTHROSCOPY Right 09/2014   LIVER BIOPSY N/A 05/11/2020   Procedure: LIVER BIOPSY;  Surgeon: Vernetta Berg, MD;  Location: [facility];  Service: General;  Laterality: N/A;   POLYPECTOMY  03/24/2020   Procedure: POLYPECTOMY;  Surgeon: Rollin Dover, MD;  Location: [facility];  Service: Endoscopy;;    OB History   No obstetric history on file.      Home Medications    Prior to Admission medications  Medication Sig Start Date End Date Taking? Authorizing Provider  albuterol  (VENTOLIN  HFA) 108 (90 Base) MCG/ACT inhaler Inhale 2 puffs into the lungs every 4 (four) hours as needed. 12/09/23   Stuart Vernell Norris, PA-C  cetirizine  (ZYRTEC ) 10 MG tablet Take 10 mg by mouth daily as needed for allergies.    [provider]  escitalopram  (LEXAPRO ) 10 MG tablet Take 1 tablet (10 mg total) by mouth daily. 11/24/23   Gladis Mary-Margaret, FNP  fluticasone  (FLONASE ) 50 MCG/ACT nasal spray Place 1 spray into both nostrils as needed for allergies or rhinitis.    [provider]  fluticasone  (FLOVENT  HFA) 110 MCG/ACT inhaler Inhale 1 puff  into the lungs 2 (two) times daily. 12/20/23   Richad Jon HERO, NP  hydrOXYzine  (ATARAX ) 10 MG tablet Take 1 tablet (10 mg total) by mouth 3 (three) times daily as needed. 02/07/24   Fleming, Zelda W, NP  levothyroxine  (SYNTHROID ) 75 MCG tablet Take 1 tablet (75 mcg total) by mouth daily before breakfast. 11/24/23   Gladis, Mary-Margaret, FNP  omeprazole  (PRILOSEC) 40 MG capsule Take 1 capsule (40 mg total) by mouth every morning. 11/24/23    Gladis Mary-Margaret, FNP  Probiotic Product (ALOE 10000 & PROBIOTICS PO) Probiotics    [provider]  SUMAtriptan  (IMITREX ) 25 MG tablet TAKE 1 TABLET AS NEEDED FOR MIGRAINE. MAY REPEAT IN 2 HOURS IF HEADACHE PERSISTS OR RECURS. 10/06/23   Gladis, Mary-Margaret, FNP  triamcinolone  ointment (KENALOG ) 0.5 % Apply 1 Application topically 2 (two) times daily. 02/07/24   Theotis Haze ORN, NP    Family History Family History  Problem Relation Age of Onset   Hypertension Father    Sleep apnea Father    Breast cancer Maternal Grandmother 57 - 59   Ovarian cancer Maternal Grandmother    Thyroid  cancer Maternal Grandmother     Social History Social History[1]   Allergies   Amoxicillin    Review of Systems Review of Systems Per HPI  Physical Exam Triage Vital Signs ED Triage Vitals [02/12/24 1501]  Encounter Vitals Group     BP (!) 143/86     Girls Systolic BP Percentile      Girls Diastolic BP Percentile      Boys Systolic BP Percentile      Boys Diastolic BP Percentile      Pulse Rate 81     Resp 20     Temp 98.2 F (36.8 C)     Temp Source Oral     SpO2 94 %     Weight      Height      Head Circumference      Peak Flow      Pain Score 6     Pain Loc      Pain Education      Exclude from Growth Chart    No data found.  Updated Vital Signs BP (!) 143/86 (BP Location: Right Arm)   Pulse 81   Temp 98.2 F (36.8 C) (Oral)   Resp 20   LMP 09/13/2015   SpO2 94%   Visual Acuity Right Eye Distance:   Left Eye Distance:   Bilateral Distance:    Right Eye Near:   Left Eye Near:    Bilateral Near:     Physical Exam Vitals and nursing note reviewed.  Constitutional:      Appearance: Normal appearance. She is not ill-appearing.  HENT:     Head: Atraumatic.     Right Ear: Tympanic membrane normal.     Left Ear: There is impacted cerumen.     Nose: Nose normal.     Mouth/Throat:     Mouth: Mucous membranes are moist.     Pharynx: Oropharynx is  clear.  Eyes:     Extraocular Movements: Extraocular movements intact.     Conjunctiva/sclera: Conjunctivae normal.  Cardiovascular:     Rate and Rhythm: Normal rate.  Pulmonary:     Effort: Pulmonary effort is normal.  Musculoskeletal:        General: Normal range of motion.     Cervical back: Normal range of motion and neck supple.  Skin:    General: Skin  is warm and dry.  Neurological:     Mental Status: She is alert and oriented to person, place, and time.  Psychiatric:        Mood and Affect: Mood normal.        Thought Content: Thought content normal.        Judgment: Judgment normal.      UC Treatments / Results  Labs (all labs ordered are listed, but only abnormal results are displayed) Labs Reviewed - No data to display  EKG   Radiology No results found.  Procedures Procedures (including critical care time)  Medications Ordered in UC Medications - No data to display  Initial Impression / Assessment and Plan / UC Course  I have reviewed the triage vital signs and the nursing notes.  Pertinent labs & imaging results that were available during my care of the patient were reviewed by me and considered in my medical decision making (see chart for details).     Lavage performed to the left ear with warm water  and peroxide.  Patient tolerated well, TM visualized and benign postprocedure.  Return for worsening symptoms.  Final Clinical Impressions(s) / UC Diagnoses   Final diagnoses:  Impacted cerumen of left ear   Discharge Instructions   None    ED Prescriptions   None    PDMP not reviewed this encounter.    [1]  Social History Tobacco Use   Smoking status: Never   Smokeless tobacco: Never  Vaping Use   Vaping status: Never Used  Substance Use Topics   Alcohol use: No   Drug use: No     Stuart Vernell Norris, PA-C 02/12/24 1554  "

## 2024-02-12 NOTE — ED Triage Notes (Signed)
 Pt reports left ear fullness since Sunday. Pt reports has tried warm compresses and debrox with no change in symptoms.

## 2024-02-23 ENCOUNTER — Ambulatory Visit: Admitting: Nurse Practitioner

## 2024-02-23 VITALS — BP 135/89 | HR 95 | Temp 97.9°F | Ht 65.0 in | Wt >= 6400 oz

## 2024-02-23 DIAGNOSIS — L309 Dermatitis, unspecified: Secondary | ICD-10-CM

## 2024-02-23 MED ORDER — PREDNISONE 20 MG PO TABS: 40.0000 mg | ORAL_TABLET | Freq: Every day | ORAL | 0 refills | Status: AC

## 2024-02-23 MED ORDER — TRIAMCINOLONE ACETONIDE 0.1 % EX CREA: 1.0000 | TOPICAL_CREAM | Freq: Two times a day (BID) | CUTANEOUS | 0 refills | Status: AC

## 2024-02-23 NOTE — Progress Notes (Unsigned)
" ° °  Subjective:    Patient ID: Sylvia Taylor, female    DOB: [birth year], 54 y.o.   MRN: [id number]   Chief Complaint: rash   Rash Pertinent negatives include no eye pain or shortness of breath.    Developed a rash on bil forearms. Started around christmas - small spot on one arm . Now it is spreading up her arm. Itchy and feels hot to touch.  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   Elevated liver enzymes 09/08/2023   Gastroesophageal reflux disease 09/08/2023   Migraine with aura and with status migrainosus, not intractable 09/08/2023   Elevated BP without diagnosis of hypertension 09/08/2023   Pes anserinus bursitis of left knee 06/24/2023   Primary osteoarthritis of both knees 06/24/2023   GAD (generalized anxiety disorder) 02/14/2022   Obstructive sleep apnea 04/20/2017   Severe obesity (BMI >= 40) (HCC) 06/06/2014   Fluid retention in legs 06/02/2013   Hypothyroidism 11/03/2012   Allergic rhinitis 11/03/2012       Review of Systems  Constitutional:  Negative for diaphoresis.  Eyes:  Negative for pain.  Respiratory:  Negative for shortness of breath.   Cardiovascular:  Negative for chest pain, palpitations and leg swelling.  Gastrointestinal:  Negative for abdominal pain.  Endocrine: Negative for polydipsia.  Skin:  Positive for rash.  Neurological:  Negative for dizziness, weakness and headaches.  Hematological:  Does not bruise/bleed easily.  All other systems reviewed and are negative.      Objective:   Physical Exam Constitutional:      Appearance: Normal appearance.  Cardiovascular:     Rate and Rhythm: Normal rate and regular rhythm.     Heart sounds: Normal heart sounds.  Pulmonary:     Breath sounds: Normal breath sounds.  Skin:    General: Skin is warm.     Findings: Rash present.     Comments: Erythematous scaly rash left arm all the way up  Neurological:     Mental Status: She is alert.     BP 135/89   Pulse 95   Temp 97.9 F (36.6 C) (Temporal)   Ht 5' 5 (1.651 m)   Wt (!) 409 lb (185.5 kg)   LMP 09/13/2015   SpO2 94%   BMI 68.06 kg/m         Assessment & Plan:   Sylvia Taylor in today with chief complaint of Rash   1. Dermatitis (Primary) Cool compresses Avoid scratching - triamcinolone  cream (KENALOG ) 0.1 %; Apply 1 Application topically 2 (two) times daily.  Dispense: 453.6 g; Refill: 0 - predniSONE  (DELTASONE ) 20 MG tablet; Take 2 tablets (40 mg total) by mouth daily with breakfast for 5 days. 2 po daily for 5 days  Dispense: 10 tablet; Refill: 0    The above assessment and management plan was discussed with the patient. The patient verbalized understanding of and has agreed to the management plan. Patient is aware to call the clinic if symptoms persist or worsen. Patient is aware when to return to the clinic for a follow-up visit. Patient educated on when it is appropriate to go to the emergency department.   Mary-Margaret Gladis, FNP   "

## 2024-02-23 NOTE — Patient Instructions (Signed)
 Itchy, Irritated Skin (Eczema): What to Know Eczema is a group of skin conditions that cause rough and inflamed skin. There are different types of eczema. They each have different triggers, symptoms, and treatments. Eczema is not contagious, so it doesn't spread from person to person. It can appear on different parts of your body at different times. It doesn't look the same on everyone. What are the causes? The exact cause of eczema isn't known. Things that can make it worse includes: Irritants. Allergens. What are the signs or symptoms? Symptoms depend on the type of eczema you have. They can range from mild to very bad. Symptoms often include: Itchiness. Dry skin. Rash or skin bumps. Swelling. Crusty, flaky, or scaly patches. Thick patches of skin. Oozing skin or blisters. How is this diagnosed? This condition may be diagnosed based on: Symptoms. Physical exam. Medical history. Skin patch tests that use allergen patches on your back to check for allergic reactions. You may need to see a skin specialist called a dermatologist. This specialist can help diagnose and treat this condition. How is this treated? There's no cure for eczema, but you can manage your symptoms. Treatment depends on the type of eczema you have. Options may include: Medicines to lessen itching (antihistamines). Medicine to put on your skin to lessen swelling and irritation (corticosteroid creams or ointments). Light therapy, also called phototherapy. The affected skin is put under ultraviolet (UV) light. Medicines may be prescribed or purchased at the store. This will depend on the strength that's needed. Follow these instructions at home: Skin Care Use skin creams or lotions as told. Do not scratch your skin. This can make your rash worse. Keep fingernails short to avoid scratching open the skin. General instructions Take or apply your medicines as told. Avoid triggers and allergens. Treat symptoms fast if  you have a flare-up. Keep all follow-up visits to make sure your treatment plan is working. Where to find more information American Academy of Dermatology: MarketingSheets.si National Eczema Association: nationaleczema.org The Society for Pediatric Dermatology: pedsderm.net Contact a health care provider if: You have very bad itching even with treatment. You often scratch your skin until it bleeds. Your rash looks different than normal. You have a fever. You have symptoms that don't go away with treatment. You have more redness, pain, or swelling over the affected skin. You have warmth or pus coming from the affected skin. This information is not intended to replace advice given to you by your health care provider. Make sure you discuss any questions you have with your health care provider. Document Revised: 07/02/2022 Document Reviewed: 07/02/2022 Elsevier Patient Education  2024 [employer].
